# Patient Record
Sex: Male | Born: 1980 | Race: Black or African American | Hispanic: No | Marital: Single | Smoking: Current every day smoker
Health system: Southern US, Community
[De-identification: ages and names within clinical notes are randomized; demographics above are authoritative.]

## PROBLEM LIST (undated history)

## (undated) DIAGNOSIS — F32A Depression, unspecified: Secondary | ICD-10-CM

## (undated) DIAGNOSIS — Z8719 Personal history of other diseases of the digestive system: Secondary | ICD-10-CM

## (undated) DIAGNOSIS — F1910 Other psychoactive substance abuse, uncomplicated: Secondary | ICD-10-CM

## (undated) DIAGNOSIS — Z789 Other specified health status: Secondary | ICD-10-CM

## (undated) DIAGNOSIS — F329 Major depressive disorder, single episode, unspecified: Secondary | ICD-10-CM

## (undated) DIAGNOSIS — F209 Schizophrenia, unspecified: Secondary | ICD-10-CM

## (undated) HISTORY — PX: NO PAST SURGERIES: SHX2092

---

## 2012-07-22 ENCOUNTER — Emergency Department (HOSPITAL_COMMUNITY): Payer: 59

## 2012-07-22 ENCOUNTER — Encounter (HOSPITAL_COMMUNITY): Payer: Self-pay | Admitting: Emergency Medicine

## 2012-07-22 ENCOUNTER — Emergency Department (HOSPITAL_COMMUNITY)
Admission: EM | Admit: 2012-07-22 | Discharge: 2012-07-22 | Disposition: A | Discharge status: Home / Self Care | Payer: 59 | Attending: Emergency Medicine | Admitting: Emergency Medicine

## 2012-07-22 DIAGNOSIS — Y9389 Activity, other specified: Secondary | ICD-10-CM | POA: Insufficient documentation

## 2012-07-22 DIAGNOSIS — S0591XA Unspecified injury of right eye and orbit, initial encounter: Secondary | ICD-10-CM

## 2012-07-22 DIAGNOSIS — Y33XXXA Other specified events, undetermined intent, initial encounter: Secondary | ICD-10-CM | POA: Insufficient documentation

## 2012-07-22 DIAGNOSIS — Y929 Unspecified place or not applicable: Secondary | ICD-10-CM | POA: Insufficient documentation

## 2012-07-22 DIAGNOSIS — S058X9A Other injuries of unspecified eye and orbit, initial encounter: Secondary | ICD-10-CM | POA: Insufficient documentation

## 2012-07-22 DIAGNOSIS — H539 Unspecified visual disturbance: Secondary | ICD-10-CM | POA: Insufficient documentation

## 2012-07-22 DIAGNOSIS — F17200 Nicotine dependence, unspecified, uncomplicated: Secondary | ICD-10-CM | POA: Insufficient documentation

## 2012-07-22 DIAGNOSIS — S0500XA Injury of conjunctiva and corneal abrasion without foreign body, unspecified eye, initial encounter: Secondary | ICD-10-CM

## 2012-07-22 MED ORDER — TETRACAINE HCL 0.5 % OP SOLN
2.0000 [drp] | Freq: Once | OPHTHALMIC | Status: AC
  Administered 2012-07-22: 2 [drp] via OPHTHALMIC
  Filled 2012-07-22: qty 2

## 2012-07-22 MED ORDER — FLUORESCEIN-BENOXINATE 0.25-0.4 % OP SOLN
1.0000 [drp] | Freq: Once | OPHTHALMIC | Status: DC
  Filled 2012-07-22: qty 5

## 2012-07-22 MED ORDER — OXYCODONE-ACETAMINOPHEN 5-325 MG PO TABS
2.0000 | ORAL_TABLET | Freq: Once | ORAL | Status: AC
  Administered 2012-07-22: 2 via ORAL
  Filled 2012-07-22: qty 2

## 2012-07-22 MED ORDER — SULFACETAMIDE SODIUM 10 % OP SOLN: 2.0000 [drp] | OPHTHALMIC | Status: AC

## 2012-07-22 MED ORDER — FLUORESCEIN SODIUM 1 MG OP STRP
1.0000 | ORAL_STRIP | Freq: Once | OPHTHALMIC | Status: AC
  Administered 2012-07-22: 1 via OPHTHALMIC
  Filled 2012-07-22: qty 1

## 2012-07-22 NOTE — ED Notes (Signed)
NAD noted at time of d/c home 
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
## 2012-07-22 NOTE — ED Notes (Signed)
20/20 Left Eye 20/30 Right Eye

## 2012-07-22 NOTE — ED Notes (Signed)
Pt in fast track room 4, for vision acuity and exam

## 2012-07-23 NOTE — ED Provider Notes (Signed)
Medical screening examination/treatment/procedure(s) were performed by non-physician practitioner and as supervising physician I was immediately available for consultation/collaboration.  Marwan T Powers, MD 07/23/12 1647 

## 2013-03-19 ENCOUNTER — Emergency Department (HOSPITAL_COMMUNITY): Payer: 59

## 2013-03-19 ENCOUNTER — Emergency Department (HOSPITAL_COMMUNITY)
Admission: EM | Admit: 2013-03-19 | Discharge: 2013-03-19 | Disposition: A | Discharge status: Home / Self Care | Payer: 59 | Attending: Emergency Medicine | Admitting: Emergency Medicine

## 2013-03-19 ENCOUNTER — Encounter (HOSPITAL_COMMUNITY): Payer: Self-pay | Admitting: Emergency Medicine

## 2013-03-19 DIAGNOSIS — R079 Chest pain, unspecified: Secondary | ICD-10-CM

## 2013-03-19 DIAGNOSIS — F17200 Nicotine dependence, unspecified, uncomplicated: Secondary | ICD-10-CM | POA: Insufficient documentation

## 2013-03-19 DIAGNOSIS — R0789 Other chest pain: Secondary | ICD-10-CM | POA: Insufficient documentation

## 2013-03-19 LAB — CBC WITH DIFFERENTIAL/PLATELET
Basophils Absolute: 0 10*3/uL (ref 0.0–0.1)
Eosinophils Relative: 1 % (ref 0–5)
HCT: 45 % (ref 39.0–52.0)
Hemoglobin: 15.9 g/dL (ref 13.0–17.0)
Lymphocytes Relative: 27 % (ref 12–46)
Lymphs Abs: 2.5 10*3/uL (ref 0.7–4.0)
MCV: 86.9 fL (ref 78.0–100.0)
Monocytes Absolute: 0.9 10*3/uL (ref 0.1–1.0)
Neutro Abs: 5.8 10*3/uL (ref 1.7–7.7)
RBC: 5.18 MIL/uL (ref 4.22–5.81)
RDW: 13.6 % (ref 11.5–15.5)
WBC: 9.3 10*3/uL (ref 4.0–10.5)

## 2013-03-19 LAB — BASIC METABOLIC PANEL
CO2: 26 mEq/L (ref 19–32)
Chloride: 99 mEq/L (ref 96–112)
Creatinine, Ser: 0.98 mg/dL (ref 0.50–1.35)
GFR calc Af Amer: >90 mL/min (ref >90)
Potassium: 4.1 mEq/L (ref 3.5–5.1)
Sodium: 137 mEq/L (ref 135–145)

## 2013-03-19 LAB — POCT I-STAT TROPONIN I

## 2013-03-19 MED ORDER — GI COCKTAIL ~~LOC~~
30.0000 mL | Freq: Once | ORAL | Status: AC
  Administered 2013-03-19: 30 mL via ORAL
  Filled 2013-03-19: qty 30

## 2013-03-19 MED ORDER — KETOROLAC TROMETHAMINE 30 MG/ML IJ SOLN
30.0000 mg | Freq: Once | INTRAMUSCULAR | Status: AC
  Administered 2013-03-19: 30 mg via INTRAVENOUS
  Filled 2013-03-19: qty 1

## 2013-03-19 NOTE — ED Provider Notes (Signed)
History    CSN: [id number] Arrival date & time 03/19/13  1008  First MD Initiated Contact with Patient 03/19/13 1025     Chief Complaint  Patient presents with  . Chest Pain   (Consider location/radiation/quality/duration/timing/severity/associated sxs/prior Treatment) HPI Comments: 32 yo male with smoking hx, no FH cardiac, no other cardiac risk factors presents with left chest pain since Thursday, worse with movement.  Clarified with patient after reading nursing notes, worse with all movement not breathing.  Patient denies blood clot history, active cancer, recent major trauma or surgery, unilateral leg swelling/ pain, recent long travel, hemoptysis or oral contraceptives.  Pt has drank heavy etoh the past two days.  He bing drinks on weekends.  Pain constant since last night.  Non radiating, no diaphoresis or exertional component.  No injury.   Patient is a 32 y.o. male presenting with chest pain. The history is provided by the patient.  Chest Pain Pain location:  L chest Associated symptoms: no abdominal pain, no back pain, no fever, no headache, no shortness of breath and not vomiting    History reviewed. No pertinent past medical history. History reviewed. No pertinent past surgical history. History reviewed. No pertinent family history. History  Substance Use Topics  . Smoking status: Current Every Day Smoker  . Smokeless tobacco: Not on file  . Alcohol Use: Yes    Review of Systems  Constitutional: Negative for fever and chills.  HENT: Negative for neck pain and neck stiffness.   Eyes: Negative for visual disturbance.  Respiratory: Negative for shortness of breath.   Cardiovascular: Positive for chest pain.  Gastrointestinal: Negative for vomiting and abdominal pain.  Genitourinary: Negative for dysuria and flank pain.  Musculoskeletal: Negative for back pain.  Skin: Negative for rash.  Neurological: Negative for light-headedness and headaches.    Allergies   Review of patient's allergies indicates no known allergies.  Home Medications  No current outpatient prescriptions on file. BP 143/90  Pulse 101  Temp(Src) 98 F (36.7 C) (Oral)  Resp 18  SpO2 95% Physical Exam  Nursing note and vitals reviewed. Constitutional: He is oriented to person, place, and time. He appears well-developed and well-nourished.  HENT:  Head: Normocephalic and atraumatic.  Eyes: Conjunctivae are normal. Right eye exhibits no discharge. Left eye exhibits no discharge.  Neck: Normal range of motion. Neck supple. No tracheal deviation present.  Cardiovascular: Normal rate and regular rhythm.   Pulmonary/Chest: Effort normal and breath sounds normal.  Abdominal: Soft. He exhibits no distension. There is no tenderness. There is no guarding.  Musculoskeletal: He exhibits tenderness (tender parasternal to palpation bilateral). He exhibits no edema.  Neurological: He is alert and oriented to person, place, and time.  Skin: Skin is warm. No rash noted.  Psychiatric: He has a normal mood and affect.    ED Course  Procedures (including critical care time) Labs Reviewed  CBC WITH DIFFERENTIAL  BASIC METABOLIC PANEL  TROPONIN I   Dg Chest 2 View  03/19/2013   *RADIOLOGY REPORT*  Clinical Data: Chest pressure  CHEST - 2 VIEW  Comparison: None.  Findings: Normal cardiac silhouette.  No effusion, infiltrate, or pneumothorax.No acute osseous abnormality.  IMPRESSION: Normal chest radiograph.   Original Report Authenticated By: Genevive Bi, M.D.   No diagnosis found.  MDM   Date: 03/19/2013  Rate: 106  Rhythm: sinus tachycardia  QRS Axis: normal  Intervals: normal  ST/T Wave abnormalities: normal  Conduction Disutrbances:none  Narrative Interpretation:   Old EKG  Reviewed: none available  CP resolved in ED.  Atypical and pt low risk.  No PE risk factors.   Discussed smoking cessation for 5 min with pt and outpt fup. Pt will fup with primary doctor to discuss  outpt stress test. Reasons to return stressed. Well appearing at dc.    Enid Skeens, MD 03/19/13 1340

## 2013-03-19 NOTE — ED Notes (Signed)
Pt c/o left sided CP x 3 days worse with movement and inspiration

## 2014-07-30 ENCOUNTER — Encounter (HOSPITAL_COMMUNITY): Payer: Self-pay | Admitting: *Deleted

## 2014-07-30 ENCOUNTER — Encounter (HOSPITAL_COMMUNITY): Payer: Self-pay | Admitting: Emergency Medicine

## 2014-07-30 ENCOUNTER — Ambulatory Visit (HOSPITAL_COMMUNITY)
Admission: RE | Admit: 2014-07-30 | Discharge: 2014-07-30 | Disposition: A | Discharge status: Home / Self Care | Payer: 59 | Attending: Psychiatry | Admitting: Psychiatry

## 2014-07-30 ENCOUNTER — Emergency Department (HOSPITAL_COMMUNITY)
Admission: EM | Admit: 2014-07-30 | Discharge: 2014-08-01 | Disposition: A | Discharge status: Home / Self Care | Payer: 59 | Attending: Emergency Medicine | Admitting: Emergency Medicine

## 2014-07-30 DIAGNOSIS — R45851 Suicidal ideations: Secondary | ICD-10-CM

## 2014-07-30 DIAGNOSIS — Z720 Tobacco use: Secondary | ICD-10-CM | POA: Insufficient documentation

## 2014-07-30 DIAGNOSIS — F1420 Cocaine dependence, uncomplicated: Secondary | ICD-10-CM | POA: Diagnosis present

## 2014-07-30 DIAGNOSIS — F1099 Alcohol use, unspecified with unspecified alcohol-induced disorder: Secondary | ICD-10-CM | POA: Diagnosis present

## 2014-07-30 DIAGNOSIS — F1910 Other psychoactive substance abuse, uncomplicated: Secondary | ICD-10-CM

## 2014-07-30 DIAGNOSIS — F32A Depression, unspecified: Secondary | ICD-10-CM

## 2014-07-30 DIAGNOSIS — F329 Major depressive disorder, single episode, unspecified: Secondary | ICD-10-CM | POA: Insufficient documentation

## 2014-07-30 DIAGNOSIS — F1410 Cocaine abuse, uncomplicated: Secondary | ICD-10-CM | POA: Insufficient documentation

## 2014-07-30 DIAGNOSIS — F333 Major depressive disorder, recurrent, severe with psychotic symptoms: Secondary | ICD-10-CM | POA: Insufficient documentation

## 2014-07-30 HISTORY — DX: Major depressive disorder, single episode, unspecified: F32.9

## 2014-07-30 HISTORY — DX: Depression, unspecified: F32.A

## 2014-07-30 HISTORY — DX: Other psychoactive substance abuse, uncomplicated: F19.10

## 2014-07-30 LAB — SALICYLATE LEVEL

## 2014-07-30 LAB — COMPREHENSIVE METABOLIC PANEL
ALK PHOS: 70 U/L (ref 39–117)
ALT: 53 U/L (ref 0–53)
ANION GAP: 15 (ref 5–15)
AST: 62 U/L — AB (ref 0–37)
Albumin: 3.2 g/dL — ABNORMAL LOW (ref 3.5–5.2)
BUN: 12 mg/dL (ref 6–23)
CHLORIDE: 100 meq/L (ref 96–112)
CO2: 21 mEq/L (ref 19–32)
Calcium: 9.3 mg/dL (ref 8.4–10.5)
Creatinine, Ser: 0.98 mg/dL (ref 0.50–1.35)
GFR calc Af Amer: >90 mL/min (ref >90)
GFR calc non Af Amer: >90 mL/min (ref >90)
Glucose, Bld: 117 mg/dL — ABNORMAL HIGH (ref 70–99)
Potassium: 4.7 mEq/L (ref 3.7–5.3)
SODIUM: 136 meq/L — AB (ref 137–147)
Total Bilirubin: 0.6 mg/dL (ref 0.3–1.2)
Total Protein: 6.7 g/dL (ref 6.0–8.3)

## 2014-07-30 LAB — ETHANOL

## 2014-07-30 LAB — RAPID URINE DRUG SCREEN, HOSP PERFORMED
Amphetamines: NOT DETECTED
Barbiturates: NOT DETECTED
Benzodiazepines: NOT DETECTED
Cocaine: POSITIVE — AB
Opiates: NOT DETECTED
Tetrahydrocannabinol: NOT DETECTED

## 2014-07-30 LAB — CBC
HCT: 49.8 % (ref 39.0–52.0)
HEMOGLOBIN: 17.4 g/dL — AB (ref 13.0–17.0)
MCH: 31.5 pg (ref 26.0–34.0)
MCHC: 34.9 g/dL (ref 30.0–36.0)
MCV: 90.2 fL (ref 78.0–100.0)
PLATELETS: 214 10*3/uL (ref 150–400)
RBC: 5.52 MIL/uL (ref 4.22–5.81)
RDW: 12.9 % (ref 11.5–15.5)
WBC: 10.8 10*3/uL — AB (ref 4.0–10.5)

## 2014-07-30 LAB — ACETAMINOPHEN LEVEL: Acetaminophen (Tylenol), Serum: <15 ug/mL (ref 10–30)

## 2014-07-30 MED ORDER — LORAZEPAM 1 MG PO TABS
1.0000 mg | ORAL_TABLET | Freq: Three times a day (TID) | ORAL | Status: DC | PRN
  Administered 2014-07-30 – 2014-07-31 (×2): 1 mg via ORAL
  Filled 2014-07-30 (×2): qty 1

## 2014-07-30 MED ORDER — ACETAMINOPHEN 325 MG PO TABS: 650.0000 mg | ORAL_TABLET | ORAL | Status: DC | PRN

## 2014-07-30 MED ORDER — NICOTINE 21 MG/24HR TD PT24
21.0000 mg | MEDICATED_PATCH | Freq: Every day | TRANSDERMAL | Status: DC
  Administered 2014-07-30 – 2014-08-01 (×3): 21 mg via TRANSDERMAL
  Filled 2014-07-30 (×3): qty 1

## 2014-07-30 MED ORDER — ONDANSETRON HCL 4 MG PO TABS: 4.0000 mg | ORAL_TABLET | Freq: Three times a day (TID) | ORAL | Status: DC | PRN

## 2014-07-30 NOTE — ED Notes (Addendum)
[facility]. Pt reports that he is suicidal. Yesterday he played russian roulette and sometimes he cuts his "veins". Pt feels as if he shouldn't have to live with depression and addiction (cocaine, alcohol) that he should just end his life and "be done with it". Pt is calm and cooperative upon assessment and very open about why he is here.
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
## 2014-07-30 NOTE — ED Notes (Signed)
Report received from Brooks RN. Pt. Sleeping, respirations regular and unlabored. Will continue to monitor for safety. Q 15 minute checks continue. 

## 2014-07-30 NOTE — ED Notes (Signed)
[facility]

## 2014-07-30 NOTE — ED Notes (Signed)
Nursing admission note: [facility] from [facility] and crack cocaine. He stated his last use of both was on 07-29-14 when he used x6 40 oz beers and 2-3 grams of cocaine. He stated that he needs to get off the substances because it is depressing him and he stated is addiction issues are causing him to be suicidal. On admission he was able to contract for the SI , verbally. He also stated he has had auditory hallucinations in the past, but is not having them now.  He denies any hi/vh. He denied any home med's, allergies and pain. He was polite, cooperative and seems motivated for treatment. He was given a ativan for anxiety at 1614 and he later went to sleep. RN will monitor and Q 15 min ck's continue.

## 2014-07-31 MED ORDER — FLUOXETINE HCL 20 MG PO CAPS
20.0000 mg | ORAL_CAPSULE | Freq: Every day | ORAL | Status: DC
  Administered 2014-07-31 – 2014-08-01 (×2): 20 mg via ORAL
  Filled 2014-07-31 (×2): qty 1

## 2014-07-31 MED ORDER — ZOLPIDEM TARTRATE 5 MG PO TABS
5.0000 mg | ORAL_TABLET | Freq: Every day | ORAL | Status: DC
  Administered 2014-07-31: 5 mg via ORAL
  Filled 2014-07-31: qty 1

## 2014-07-31 NOTE — ED Notes (Signed)
Patient has been in his room most of the day.  Up to bathroom and to shower.  Has been pleasant and cooperative.  States he hears voices at times, but not right now.  Requested medication for anxiety which was given and reported as effective.  Also asked if there was any detox for cocaine.  Advised there is not, but he could pursue rehab centers.

## 2014-07-31 NOTE — BH Assessment (Signed)
[facility] Assessment Progress Note    The following Adult psych facilities were contacted in an attempt to place the pt:  Referral faxed for review: [facility] (beds available per Margaret) [facility] (beds available per Jennifer)  At capacity:  [facility] (at capacity per Brent) [facility] (at capacity per Erica) [facility] (at capacity per Jonathan) [facility] (at capacity per Sissy) [facility] (at capacity per Pat) [facility] (at capacity per Paula) [facility] (at capacity per Kimberly)  Not taking SA patients: [facility]  [facility]  No answer: [facility]    TTS will continue to seek placement for the pt.  Damiana Berrian, MS, CRC, LPC Licensed Professional Counselor Therapeutic Triage Specialist [facility] Phone: [phone] Fax: [phone] 

## 2014-08-01 DIAGNOSIS — F1994 Other psychoactive substance use, unspecified with psychoactive substance-induced mood disorder: Secondary | ICD-10-CM

## 2014-08-01 DIAGNOSIS — F332 Major depressive disorder, recurrent severe without psychotic features: Secondary | ICD-10-CM

## 2014-08-01 MED ORDER — TRAZODONE HCL 100 MG PO TABS: 100.0000 mg | ORAL_TABLET | Freq: Every day | ORAL | Status: DC

## 2014-08-01 MED ORDER — FLUOXETINE HCL 20 MG PO CAPS: 20.0000 mg | ORAL_CAPSULE | Freq: Every day | ORAL | Status: DC
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
## 2014-08-01 NOTE — ED Notes (Signed)
Patient reports feeling much after taking prozac.

## 2014-08-01 NOTE — BHH Suicide Risk Assessment (Signed)
Suicide Risk Assessment  Discharge Assessment     Demographic Factors:  Male  Total Time spent with patient: 45 minutes  Psychiatric Specialty Exam:     Blood pressure 119/80, pulse 84, temperature 98 F (36.7 C), temperature source Oral, resp. rate 16, SpO2 100 %.There is no height or weight on file to calculate BMI.   General Appearance: Casual and Fairly Groomed  Patent attorneyye Contact:: Good  Speech: Clear and Coherent and Normal Rate  Volume: Normal  Mood: Anxious  Affect: Appropriate and Congruent  Thought Process: Coherent and Goal Directed  Orientation: Full (Time, Place, and Person)  Thought Content: WDL  Suicidal Thoughts: No  Homicidal Thoughts: No  Memory: Immediate; Fair Recent; Fair Remote; Fair  Judgement: Fair  Insight: Good  Psychomotor Activity: Normal  Concentration: Good  Recall: Good  Fund of Knowledge:Good  Language: Good  Akathisia: No  Handed:   AIMS (if indicated):    Assets: Communication Skills Leisure Time Physical Health Resilience Social Support  Sleep:     Musculoskeletal: Strength & Muscle Tone: within normal limits Gait & Station: normal Patient leans: N/A    Mental Status Per Nursing Assessment::   On Admission:     Current Mental Status by Physician: Denies SI at this time  Loss Factors: Financial problems/change in socioeconomic status  Historical Factors: Impulsivity  Risk Reduction Factors:   Positive social support and Positive coping skills or problem solving skills  Continued Clinical Symptoms:  Depression:   Anhedonia  Cognitive Features That Contribute To Risk:  Closed-mindedness    Suicide Risk:  Mild:  Suicidal ideation of limited frequency, intensity, duration, and specificity.  There are no identifiable plans, no associated intent, mild dysphoria and related symptoms, good self-control (both objective and subjective assessment), few other risk factors, and  identifiable protective factors, including available and accessible social support.  Discharge Diagnoses:   AXIS I: Major Depression, Recurrent severe, Substance Abuse and Substance Induced Mood Disorder AXIS II: Deferred AXIS III:  Past Medical History  Diagnosis Date  . Substance abuse   . Depression    AXIS IV: other psychosocial or environmental problems, problems related to social environment and problems with primary support group AXIS V: 51-60 moderate symptoms   Plan Of Care/Follow-up recommendations:  Activity:  As tolerated Diet:  Heart healthy with low sodium.  Is patient on multiple antipsychotic therapies at discharge:  No   Has Patient had three or more failed trials of antipsychotic monotherapy by history:  No  Recommended Plan for Multiple Antipsychotic Therapies: NA    Rada Zegers, Everardo AllJohn C, FNP-BC 08/01/2014, 5:50 PM

## 2014-08-01 NOTE — BH Assessment (Signed)
Discharge home with outpatient follow up per Earleen Newport, NP and Dr. Darleene Cleaver. Writer met with patient prior to discharge to discuss follow up. Patient given information to [facility] and [facility]. Patient agreeable to follow up instructions.

## 2014-08-02 ENCOUNTER — Encounter (HOSPITAL_BASED_OUTPATIENT_CLINIC_OR_DEPARTMENT_OTHER): Payer: Self-pay | Admitting: Emergency Medicine

## 2015-08-04 ENCOUNTER — Inpatient Hospital Stay (HOSPITAL_COMMUNITY)
Admission: AD | Admit: 2015-08-04 | Discharge: 2015-08-08 | DRG: 885 | Disposition: A | Discharge status: Home / Self Care | Payer: Federal, State, Local not specified - Other | Source: Intra-hospital | Attending: Psychiatry | Admitting: Psychiatry

## 2015-08-04 ENCOUNTER — Emergency Department (HOSPITAL_COMMUNITY)
Admission: EM | Admit: 2015-08-04 | Discharge: 2015-08-04 | Disposition: A | Discharge status: Other Acute Inpatient Hospital | Payer: Self-pay | Attending: Emergency Medicine | Admitting: Emergency Medicine

## 2015-08-04 ENCOUNTER — Encounter (HOSPITAL_COMMUNITY): Payer: Self-pay | Admitting: Emergency Medicine

## 2015-08-04 ENCOUNTER — Encounter (HOSPITAL_COMMUNITY): Payer: Self-pay | Admitting: *Deleted

## 2015-08-04 DIAGNOSIS — F323 Major depressive disorder, single episode, severe with psychotic features: Secondary | ICD-10-CM | POA: Diagnosis present

## 2015-08-04 DIAGNOSIS — F1410 Cocaine abuse, uncomplicated: Secondary | ICD-10-CM | POA: Insufficient documentation

## 2015-08-04 DIAGNOSIS — F17200 Nicotine dependence, unspecified, uncomplicated: Secondary | ICD-10-CM | POA: Diagnosis present

## 2015-08-04 DIAGNOSIS — F329 Major depressive disorder, single episode, unspecified: Secondary | ICD-10-CM | POA: Insufficient documentation

## 2015-08-04 DIAGNOSIS — Z79899 Other long term (current) drug therapy: Secondary | ICD-10-CM | POA: Insufficient documentation

## 2015-08-04 DIAGNOSIS — R45851 Suicidal ideations: Secondary | ICD-10-CM | POA: Diagnosis not present

## 2015-08-04 DIAGNOSIS — R443 Hallucinations, unspecified: Secondary | ICD-10-CM | POA: Insufficient documentation

## 2015-08-04 LAB — RAPID URINE DRUG SCREEN, HOSP PERFORMED
AMPHETAMINES: NOT DETECTED
BARBITURATES: NOT DETECTED
Benzodiazepines: NOT DETECTED
Cocaine: POSITIVE — AB
Opiates: NOT DETECTED
Tetrahydrocannabinol: NOT DETECTED

## 2015-08-04 LAB — COMPREHENSIVE METABOLIC PANEL
ALT: 43 U/L (ref 17–63)
AST: 32 U/L (ref 15–41)
Albumin: 4.1 g/dL (ref 3.5–5.0)
Alkaline Phosphatase: 64 U/L (ref 38–126)
Anion gap: 10 (ref 5–15)
BILIRUBIN TOTAL: 1 mg/dL (ref 0.3–1.2)
BUN: 10 mg/dL (ref 6–20)
CO2: 26 mmol/L (ref 22–32)
CREATININE: 1.1 mg/dL (ref 0.61–1.24)
Calcium: 9.1 mg/dL (ref 8.9–10.3)
Chloride: 102 mmol/L (ref 101–111)
GFR calc Af Amer: >60 mL/min (ref >60)
Glucose, Bld: 88 mg/dL (ref 65–99)
Potassium: 3.3 mmol/L — ABNORMAL LOW (ref 3.5–5.1)
Sodium: 138 mmol/L (ref 135–145)
TOTAL PROTEIN: 7.1 g/dL (ref 6.5–8.1)

## 2015-08-04 LAB — CBC
HCT: 49.1 % (ref 39.0–52.0)
Hemoglobin: 17 g/dL (ref 13.0–17.0)
MCH: 30.6 pg (ref 26.0–34.0)
MCHC: 34.6 g/dL (ref 30.0–36.0)
MCV: 88.3 fL (ref 78.0–100.0)
PLATELETS: 208 10*3/uL (ref 150–400)
RBC: 5.56 MIL/uL (ref 4.22–5.81)
RDW: 13.2 % (ref 11.5–15.5)
WBC: 11.6 10*3/uL — ABNORMAL HIGH (ref 4.0–10.5)

## 2015-08-04 LAB — SALICYLATE LEVEL: Salicylate Lvl: <4 mg/dL (ref 2.8–30.0)

## 2015-08-04 LAB — ETHANOL: ALCOHOL ETHYL (B): 22 mg/dL — AB (ref <5)

## 2015-08-04 LAB — ACETAMINOPHEN LEVEL: Acetaminophen (Tylenol), Serum: <10 ug/mL — ABNORMAL LOW (ref 10–30)

## 2015-08-04 MED ORDER — THIAMINE HCL 100 MG/ML IJ SOLN: 100.0000 mg | Freq: Once | INTRAMUSCULAR | Status: DC

## 2015-08-04 MED ORDER — CHLORDIAZEPOXIDE HCL 25 MG PO CAPS
25.0000 mg | ORAL_CAPSULE | Freq: Every day | ORAL | Status: AC
  Administered 2015-08-07: 25 mg via ORAL
  Filled 2015-08-04: qty 1

## 2015-08-04 MED ORDER — ONDANSETRON 4 MG PO TBDP: 4.0000 mg | ORAL_TABLET | Freq: Four times a day (QID) | ORAL | Status: AC | PRN

## 2015-08-04 MED ORDER — ACETAMINOPHEN 325 MG PO TABS: 650.0000 mg | ORAL_TABLET | Freq: Four times a day (QID) | ORAL | Status: DC | PRN

## 2015-08-04 MED ORDER — HYDROXYZINE HCL 25 MG PO TABS
25.0000 mg | ORAL_TABLET | Freq: Four times a day (QID) | ORAL | Status: AC | PRN
  Filled 2015-08-04: qty 1

## 2015-08-04 MED ORDER — POTASSIUM CHLORIDE CRYS ER 20 MEQ PO TBCR
20.0000 meq | EXTENDED_RELEASE_TABLET | Freq: Two times a day (BID) | ORAL | Status: DC
  Administered 2015-08-04: 20 meq via ORAL
  Filled 2015-08-04 (×3): qty 1

## 2015-08-04 MED ORDER — ALUM & MAG HYDROXIDE-SIMETH 200-200-20 MG/5ML PO SUSP: 30.0000 mL | ORAL | Status: DC | PRN

## 2015-08-04 MED ORDER — CHLORDIAZEPOXIDE HCL 25 MG PO CAPS
25.0000 mg | ORAL_CAPSULE | Freq: Three times a day (TID) | ORAL | Status: AC
  Administered 2015-08-05 (×3): 25 mg via ORAL
  Filled 2015-08-04 (×3): qty 1

## 2015-08-04 MED ORDER — ADULT MULTIVITAMIN W/MINERALS CH
1.0000 | ORAL_TABLET | Freq: Every day | ORAL | Status: DC
  Administered 2015-08-04 – 2015-08-08 (×5): 1 via ORAL
  Filled 2015-08-04 (×7): qty 1

## 2015-08-04 MED ORDER — MAGNESIUM HYDROXIDE 400 MG/5ML PO SUSP: 30.0000 mL | Freq: Every day | ORAL | Status: DC | PRN

## 2015-08-04 MED ORDER — VITAMIN B-1 100 MG PO TABS
100.0000 mg | ORAL_TABLET | Freq: Every day | ORAL | Status: DC
  Administered 2015-08-05 – 2015-08-08 (×4): 100 mg via ORAL
  Filled 2015-08-04 (×6): qty 1

## 2015-08-04 MED ORDER — CHLORDIAZEPOXIDE HCL 25 MG PO CAPS
25.0000 mg | ORAL_CAPSULE | Freq: Four times a day (QID) | ORAL | Status: AC | PRN
  Administered 2015-08-04: 25 mg via ORAL
  Filled 2015-08-04: qty 1

## 2015-08-04 MED ORDER — CHLORDIAZEPOXIDE HCL 25 MG PO CAPS
25.0000 mg | ORAL_CAPSULE | Freq: Four times a day (QID) | ORAL | Status: AC
  Administered 2015-08-04 (×4): 25 mg via ORAL
  Filled 2015-08-04 (×4): qty 1

## 2015-08-04 MED ORDER — CHLORDIAZEPOXIDE HCL 25 MG PO CAPS
25.0000 mg | ORAL_CAPSULE | ORAL | Status: AC
  Administered 2015-08-06 (×2): 25 mg via ORAL
  Filled 2015-08-04 (×2): qty 1

## 2015-08-04 MED ORDER — FLUOXETINE HCL 20 MG PO CAPS
20.0000 mg | ORAL_CAPSULE | Freq: Every day | ORAL | Status: DC
  Administered 2015-08-04 – 2015-08-08 (×5): 20 mg via ORAL
  Filled 2015-08-04 (×3): qty 1
  Filled 2015-08-04: qty 7
  Filled 2015-08-04 (×3): qty 1

## 2015-08-04 MED ORDER — NICOTINE 21 MG/24HR TD PT24
21.0000 mg | MEDICATED_PATCH | Freq: Every day | TRANSDERMAL | Status: DC
  Administered 2015-08-04 – 2015-08-08 (×5): 21 mg via TRANSDERMAL
  Filled 2015-08-04 (×7): qty 1

## 2015-08-04 MED ORDER — NICOTINE 21 MG/24HR TD PT24: 21.0000 mg | MEDICATED_PATCH | Freq: Every day | TRANSDERMAL | Status: DC

## 2015-08-04 MED ORDER — ACETAMINOPHEN 325 MG PO TABS: 650.0000 mg | ORAL_TABLET | ORAL | Status: DC | PRN

## 2015-08-04 MED ORDER — AMANTADINE HCL 100 MG PO CAPS
100.0000 mg | ORAL_CAPSULE | Freq: Two times a day (BID) | ORAL | Status: DC
  Filled 2015-08-04 (×2): qty 1

## 2015-08-04 MED ORDER — LOPERAMIDE HCL 2 MG PO CAPS: 2.0000 mg | ORAL_CAPSULE | ORAL | Status: AC | PRN

## 2015-08-04 MED ORDER — ENSURE ENLIVE PO LIQD
237.0000 mL | Freq: Two times a day (BID) | ORAL | Status: DC
  Administered 2015-08-04 – 2015-08-08 (×8): 237 mL via ORAL

## 2015-08-04 MED ORDER — TRAZODONE HCL 100 MG PO TABS
100.0000 mg | ORAL_TABLET | Freq: Every day | ORAL | Status: DC
  Administered 2015-08-04 – 2015-08-05 (×2): 100 mg via ORAL
  Filled 2015-08-04 (×4): qty 1

## 2015-08-04 MED ORDER — POTASSIUM CHLORIDE CRYS ER 20 MEQ PO TBCR
20.0000 meq | EXTENDED_RELEASE_TABLET | Freq: Two times a day (BID) | ORAL | Status: AC
  Administered 2015-08-05: 20 meq via ORAL
  Filled 2015-08-04 (×2): qty 1

## 2015-08-04 MED ORDER — ONDANSETRON HCL 4 MG PO TABS: 4.0000 mg | ORAL_TABLET | Freq: Three times a day (TID) | ORAL | Status: DC | PRN

## 2015-08-04 NOTE — Progress Notes (Signed)
Adult Psychoeducational Group Note  Date:  08/04/2015 Time:  9:52 PM  Group Topic/Focus:  Wrap-Up Group:   The focus of this group is to help patients review their daily goal of treatment and discuss progress on daily workbooks.  Participation Level:  Active  Participation Quality:  Appropriate and Attentive  Affect:  Appropriate  Cognitive:  Alert and Appropriate  Insight: Appropriate and Good  Engagement in Group:  Engaged and Supportive  Modes of Intervention:  Discussion  Additional Comments:  Patient confirmed attending all groups during the day.  While in group, Patient reported beginning to feel comfortable with discussing his feelings in group and eventually crying.  Patient confirmed looking toward to attending group during the upcoming days. Patient reported working to utilize Optician, dispensingstress management.  Patient identified a future goal of working on his discharge plan.    Elmore GuiseSLOAN, Nyx Keady N 08/04/2015, 9:52 PM
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
## 2015-08-04 NOTE — Progress Notes (Signed)
[facility] and  Depressed reporting passive SI, no current plan.  Pt. Shows writer the scars on his left forearm stating he has tried to commit suicide by cutting in the past.  Pt. Reports his Girlfriend called 911 because he was threatening to do it again.  Pt. Is tearful stating "I need to go to a longterm treatment to get off the cocaine".  Pt. States,"I can't quit thinking about it, I dream about it". Pt. Was  Positive for  cocaine , ETOH level was 22, K+ was 3.3, and WBC 11.6.  Pt. Is offered food and fluids.  Pt. Denied any pain.

## 2015-08-04 NOTE — Progress Notes (Signed)
DAR NOTE: Patient presents with anxious affect and depressed mood.  Denies pain, auditory and visual hallucinations.  Reports withdrawal symptoms of diarrhea, cravings, agitation on self inventory form.  Reports suicidal thoughts but contracts for safety.  Rates depression at 5, hopelessness at 7, and anxiety at 3.  Maintained on routine safety checks.  Medications given as prescribed.  Support and encouragement offered as needed.  Attended group and participated.  States goal for today is "getting clean."  Patient in the dayroom watching TV.

## 2015-08-04 NOTE — H&P (Signed)
Psychiatric Admission Assessment Adult  Patient Identification: Brian Nolan MRN:  [id number] Date of Evaluation:  08/04/2015 Chief Complaint:  MDD,REC,SEV WITH PSYCH FEATURES Principal Diagnosis: Major depressive disorder, single episode, severe with psychotic features ([place]) Diagnosis:   Patient Active Problem List   Diagnosis Date Noted  . Major depressive disorder, single episode, severe with psychotic features ([place]) [F32.3] 08/04/2015   History of Present Illness:Per H&PPatient with a history of polysubstance abuse, previous suicidal ideation and previous self-mutilation (cutting) presents with complaint of recurrent suicidal ideations. He states he feels he has worsened to the point where he is hearing a voice telling him "to go ahead and do it" with regard to killing himself. He feels he is overwhelmed by life circumstances and cannot cope. He states he used cocaine and drank alcohol today to kill the pain.  On Evaluation:Brian Nolan is awake, alert and oriented X4 , found interacting with peers.  Denies suicidal or homicidal ideation at this time. Reports a recent event to of cutting to left arm/ wrist for "attention". Denies auditory or visual hallucination and does not appear to be responding to internal stimuli. Patient interacts well with staff and others. Patient reports he is medication compliant without mediation side effects. Report learning new coping skills and is thankful to see others in similar situations.. States his depression 8/10. Report a 20 year history of cocaine addition and is requesting help to stop. Reports good appetite other wise and resting well. Patient report she is excited regarding discharge. Support, encouragement and reassurance was provided.  Associated Signs/Symptoms: Depression Symptoms:  depressed mood, hopelessness, suicidal thoughts without plan, anxiety, (Hypo) Manic Symptoms:  Impulsivity,  Patient reports self harm 3days ago/ attempted to cut  arms/wrist with knife Anxiety Symptoms:  Excessive Worry, Social Anxiety, Psychotic Symptoms:  Hallucinations: Auditory Visual States Command Hallucination sometime PTSD Symptoms: Avoidance:  Foreshortened Future Total Time spent with patient: 45 minutes  Past Psychiatric History:MDD  Risk to Self: Is patient at risk for suicide?: Yes Risk to Others:   Prior Inpatient Therapy:   Prior Outpatient Therapy:    Alcohol Screening: 1. How often do you have a drink containing alcohol?: 4 or more times a week 2. How many drinks containing alcohol do you have on a typical day when you are drinking?: 1 or 2 3. How often do you have six or more drinks on one occasion?: Weekly Preliminary Score: 3 4. How often during the last year have you found that you were not able to stop drinking once you had started?: Weekly 5. How often during the last year have you failed to do what was normally expected from you becasue of drinking?: Weekly 6. How often during the last year have you needed a first drink in the morning to get yourself going after a heavy drinking session?: Weekly 7. How often during the last year have you had a feeling of guilt of remorse after drinking?: Daily or almost daily 8. How often during the last year have you been unable to remember what happened the night before because you had been drinking?: Weekly 9. Have you or someone else been injured as a result of your drinking?: Yes, during the last year 10. Has a relative or friend or a doctor or another health worker been concerned about your drinking or suggested you cut down?: Yes, during the last year Alcohol Use Disorder Identification Test Final Score (AUDIT): 31 Brief Intervention:  (on-call provider notified of score) Substance Abuse History in the last  12 months:  Yes.   Consequences of Substance Abuse: Family Consequences:  not supportive  Withdrawal Symptoms:   Headaches Nausea Previous Psychotropic Medications: No   Psychological Evaluations: No  Past Medical History:  Past Medical History  Diagnosis Date  . Substance abuse   . Depression    History reviewed. No pertinent past surgical history. Family History: History reviewed. No pertinent family history. Family Psychiatric  History: Mother: EtoH abuse, Uncle: Bipolar Social History:  History  Alcohol Use  . Yes     History  Drug Use  . Yes  . Special: "Crack" cocaine    Social History   Social History  . Marital Status: Single    Spouse Name: N/A  . Number of Children: N/A  . Years of Education: N/A   Social History Main Topics  . Smoking status: Current Every Day Smoker  . Smokeless tobacco: None  . Alcohol Use: Yes  . Drug Use: Yes    Special: "Crack" cocaine  . Sexual Activity: Not Asked   Other Topics Concern  . None   Social History Narrative   Additional Social History:    Pain Medications: denies Prescriptions: denies Over the Counter: denies History of alcohol / drug use?: Yes Longest period of sobriety (when/how long): unsure Negative Consequences of Use: Financial, Personal relationships, Work / Youth worker Withdrawal Symptoms: Tremors Name of Substance 1: Alcohol 1 - Age of First Use: 11 1 - Amount (size/oz): varies-has been drinking beer and wine 1 - Frequency: varies, has more than 4 drinks on one occasion weekly 1 - Duration: ongoing 1 - Last Use / Amount: yesterday "a lot"  Name of Substance 2: Crack cocaine 2 - Age of First Use: 17 2 - Amount (size/oz): varies when he can get it  2 - Frequency: varies weekly 2 - Duration: ongoing                Allergies:  No Known Allergies Lab Results:  Results for orders placed or performed during the hospital encounter of 08/04/15 (from the past 48 hour(s))  Urine rapid drug screen (hosp performed) (Not at [facility])     Status: Abnormal   Collection Time: 08/04/15 12:50 AM  Result Value Ref Range   Opiates NONE DETECTED NONE DETECTED   Cocaine POSITIVE (A)  NONE DETECTED   Benzodiazepines NONE DETECTED NONE DETECTED   Amphetamines NONE DETECTED NONE DETECTED   Tetrahydrocannabinol NONE DETECTED NONE DETECTED   Barbiturates NONE DETECTED NONE DETECTED    Comment:        DRUG SCREEN FOR MEDICAL PURPOSES ONLY.  IF CONFIRMATION IS NEEDED FOR ANY PURPOSE, NOTIFY LAB WITHIN 5 DAYS.        LOWEST DETECTABLE LIMITS FOR URINE DRUG SCREEN Drug Class       Cutoff (ng/mL) Amphetamine      1000 Barbiturate      200 Benzodiazepine   235 Tricyclics       361 Opiates          300 Cocaine          300 THC              50   Comprehensive metabolic panel     Status: Abnormal   Collection Time: 08/04/15 12:57 AM  Result Value Ref Range   Sodium 138 135 - 145 mmol/L   Potassium 3.3 (L) 3.5 - 5.1 mmol/L   Chloride 102 101 - 111 mmol/L   CO2 26 22 - 32 mmol/L  Glucose, Bld 88 65 - 99 mg/dL   BUN 10 6 - 20 mg/dL   Creatinine, Ser 1.10 0.61 - 1.24 mg/dL   Calcium 9.1 8.9 - 10.3 mg/dL   Total Protein 7.1 6.5 - 8.1 g/dL   Albumin 4.1 3.5 - 5.0 g/dL   AST 32 15 - 41 U/L   ALT 43 17 - 63 U/L   Alkaline Phosphatase 64 38 - 126 U/L   Total Bilirubin 1.0 0.3 - 1.2 mg/dL   GFR calc non Af Amer >60 >60 mL/min   GFR calc Af Amer >60 >60 mL/min    Comment: (NOTE) The eGFR has been calculated using the CKD EPI equation. This calculation has not been validated in all clinical situations. eGFR's persistently <60 mL/min signify possible Chronic Kidney Disease.    Anion gap 10 5 - 15  Ethanol (ETOH)     Status: Abnormal   Collection Time: 08/04/15 12:57 AM  Result Value Ref Range   Alcohol, Ethyl (B) 22 (H) <5 mg/dL    Comment:        LOWEST DETECTABLE LIMIT FOR SERUM ALCOHOL IS 5 mg/dL FOR MEDICAL PURPOSES ONLY   Salicylate level     Status: None   Collection Time: 08/04/15 12:57 AM  Result Value Ref Range   Salicylate Lvl <2.3 2.8 - 30.0 mg/dL  Acetaminophen level     Status: Abnormal   Collection Time: 08/04/15 12:57 AM  Result Value Ref Range    Acetaminophen (Tylenol), Serum <10 (L) 10 - 30 ug/mL    Comment:        THERAPEUTIC CONCENTRATIONS VARY SIGNIFICANTLY. A RANGE OF 10-30 ug/mL MAY BE AN EFFECTIVE CONCENTRATION FOR MANY PATIENTS. HOWEVER, SOME ARE BEST TREATED AT CONCENTRATIONS OUTSIDE THIS RANGE. ACETAMINOPHEN CONCENTRATIONS >150 ug/mL AT 4 HOURS AFTER INGESTION AND >50 ug/mL AT 12 HOURS AFTER INGESTION ARE OFTEN ASSOCIATED WITH TOXIC REACTIONS.   CBC     Status: Abnormal   Collection Time: 08/04/15 12:57 AM  Result Value Ref Range   WBC 11.6 (H) 4.0 - 10.5 K/uL   RBC 5.56 4.22 - 5.81 MIL/uL   Hemoglobin 17.0 13.0 - 17.0 g/dL   HCT 49.1 39.0 - 52.0 %   MCV 88.3 78.0 - 100.0 fL   MCH 30.6 26.0 - 34.0 pg   MCHC 34.6 30.0 - 36.0 g/dL   RDW 13.2 11.5 - 15.5 %   Platelets 208 150 - 557 K/uL    Metabolic Disorder Labs:  No results found for: HGBA1C, MPG No results found for: PROLACTIN No results found for: CHOL, TRIG, HDL, CHOLHDL, VLDL, LDLCALC  Current Medications: Current Facility-Administered Medications  Medication Dose Route Frequency Provider Last Rate Last Dose  . acetaminophen (TYLENOL) tablet 650 mg  650 mg Oral Q6H PRN Harriet Butte, NP      . alum & mag hydroxide-simeth (MAALOX/MYLANTA) 200-200-20 MG/5ML suspension 30 mL  30 mL Oral Q4H PRN Harriet Butte, NP      . chlordiazePOXIDE (LIBRIUM) capsule 25 mg  25 mg Oral Q6H PRN Harriet Butte, NP   25 mg at 08/04/15 0453  . chlordiazePOXIDE (LIBRIUM) capsule 25 mg  25 mg Oral QID Harriet Butte, NP   25 mg at 08/04/15 1204   Followed by  . [START ON 08/05/2015] chlordiazePOXIDE (LIBRIUM) capsule 25 mg  25 mg Oral TID Harriet Butte, NP       Followed by  . [START ON 08/06/2015] chlordiazePOXIDE (LIBRIUM) capsule 25 mg  25 mg  Oral BH-qamhs Harriet Butte, NP       Followed by  . [START ON 08/07/2015] chlordiazePOXIDE (LIBRIUM) capsule 25 mg  25 mg Oral Daily Harriet Butte, NP      . feeding supplement (ENSURE ENLIVE) (ENSURE ENLIVE) liquid  237 mL  237 mL Oral BID BM Myer Peer Cobos, MD   237 mL at 08/04/15 1507  . FLUoxetine (PROZAC) capsule 20 mg  20 mg Oral Daily Harriet Butte, NP   20 mg at 08/04/15 1791  . hydrOXYzine (ATARAX/VISTARIL) tablet 25 mg  25 mg Oral Q6H PRN Harriet Butte, NP      . loperamide (IMODIUM) capsule 2-4 mg  2-4 mg Oral PRN Harriet Butte, NP      . magnesium hydroxide (MILK OF MAGNESIA) suspension 30 mL  30 mL Oral Daily PRN Harriet Butte, NP      . multivitamin with minerals tablet 1 tablet  1 tablet Oral Daily Harriet Butte, NP   1 tablet at 08/04/15 [id number]  . nicotine (NICODERM CQ - dosed in mg/24 hours) patch 21 mg  21 mg Transdermal Daily Jenne Campus, MD   21 mg at 08/04/15 0810  . ondansetron (ZOFRAN-ODT) disintegrating tablet 4 mg  4 mg Oral Q6H PRN Harriet Butte, NP      . thiamine (B-1) injection 100 mg  100 mg Intramuscular Once Harriet Butte, NP   100 mg at 08/04/15 0450  . [START ON 08/05/2015] thiamine (VITAMIN B-1) tablet 100 mg  100 mg Oral Daily Harriet Butte, NP      . traZODone (DESYREL) tablet 100 mg  100 mg Oral QHS Harriet Butte, NP       PTA Medications: Prescriptions prior to admission  Medication Sig Dispense Refill Last Dose  . acetaminophen (TYLENOL) 325 MG tablet Take 650 mg by mouth every 6 (six) hours as needed for headache.     Marland Kitchen FLUoxetine (PROZAC) 20 MG capsule Take 1 capsule (20 mg total) by mouth daily. 30 capsule 0   . ibuprofen (ADVIL,MOTRIN) 200 MG tablet Take 400 mg by mouth every 6 (six) hours as needed for headache or moderate pain.   07/28/2014 at Unknown time  . traZODone (DESYREL) 100 MG tablet Take 1 tablet (100 mg total) by mouth at bedtime. 14 tablet 0     Musculoskeletal: Strength & Muscle Tone: within normal limits Gait & Station: normal Patient leans: N/A  Psychiatric Specialty Exam: Physical Exam  Nursing note and vitals reviewed. Constitutional: He is oriented to person, place, and time. He appears well-developed.  HENT:   Head: Normocephalic.  Neck: Neck supple.  Respiratory: Effort normal.  Musculoskeletal: Normal range of motion.  Neurological: He is alert and oriented to person, place, and time.  Skin: Skin is warm and dry.  Psychiatric: He has a normal mood and affect. His behavior is normal.    Review of Systems  Constitutional: Positive for malaise/fatigue.  Neurological: Positive for weakness.  Psychiatric/Behavioral: Positive for depression and substance abuse. The patient is nervous/anxious and has insomnia.   All other systems reviewed and are negative.   Blood pressure 112/66, pulse 115, temperature 98.4 F (36.9 C), temperature source Oral, resp. rate 16, height 5' 9"  (1.753 m), weight 87.544 kg (193 lb), SpO2 96 %.Body mass index is 28.49 kg/(m^2).  General Appearance: Casual and Well Groomed  Eye Contact::  Good  Speech:  Clear and Coherent and Normal Rate  Volume:  Normal  Mood:  Anxious and Depressed  Affect:  Depressed and Flat  Thought Process:  Goal Directed, Linear and Logical  Orientation:  Full (Time, Place, and Person)  Thought Content:  Hallucinations: Visual and Rumination  Suicidal Thoughts:  Yes.  without intent/plan Past attempt 3 days ago  Homicidal Thoughts:  No  Memory:  Immediate;   Fair Recent;   Fair Remote;   Fair  Judgement:  Fair  Insight:  Fair  Psychomotor Activity:  Restlessness  Concentration:  Fair  Recall:  [employer] of Knowledge:Fair  Language: Good  Akathisia:  No  Handed:  Right  AIMS (if indicated):     Assets:  Desire for Improvement Financial Resources/Insurance Transportation Vocational/Educational  ADL's:  Intact  Cognition: WNL  Sleep:  Number of Hours: 1     Treatment Plan Summary: Daily contact with patient to assess and evaluate symptoms and progress in treatment and Medication management   Continue Prozac 20 mg for mood stabilization. Continue with Trazodone 100 mg for insomnia Start Potassium Chloride SA (K-DUR, K  LOR-CON) 20 mEq  X 1 day for low potassium recheck level on 12/5//2015  Started on CWIA/ Librium Protocol Will continue to monitor vitals ,medication compliance and treatment side effects while patient is here.  Reviewed labs Glucose WNL, elevated ,BAL - 22, UDS +pos for cocaine. CSW will start working on disposition.  Patient to participate in therapeutic milieu  Observation Level/Precautions:  15 minute checks  Laboratory:  CBC Chemistry Profile UDS UA Vitamin B-12 Reviewed    Psychotherapy:  Individual and group session  Medications:  Prozac 20 mg, trazodone 100 mg  Consultations:  Psychiatry  Discharge Concerns:  Safety, stabilization, and risk of access to medication and medication stabilization   Estimated LOS: 5-7 days  Other:     I certify that inpatient services furnished can reasonably be expected to improve the patient's condition.   Derrill Center FNP-BC 12/3/20164:03 PM

## 2015-08-04 NOTE — [facility] Group Notes (Signed)
[facility] Group Notes:  (Clinical Social Work)  08/04/2015     1:15-2:15PM  Summary of Progress/Problems:   The main focus of today's process group was to discuss healthy versus unhealthy coping techniques.  Patients listed needs that they have, as well as both unhealthy and healthy ways they have tried to get their needs met.  Motivational Interviewing was utilized to help patients explore in depth the common element within the group which was suicidal ideation and/or attempts.  Various individual explained their life circumstances and feelings that led to their SI, as well as protective factors that are in their lives.  The patient expressed that he is in the hospital due to having bad thoughts last night at the [employer] downtown, after having an argument with his girlfriend.  He said repeatedly his life was doing really well in 2003 and he wants to return to that normal life.  Protective factors for him not hurting himself include his children, especially his 15yo daughter who is not very much like her mother, but more like him.  Type of Therapy:  Group Therapy - Process   Participation Level:  Active  Participation Quality:  Attentive, Sharing and Supportive  Affect:  Depressed and Tearful  Cognitive:  Appropriate  Insight:  Engaged  Engagement in Therapy:  Engaged  Modes of Intervention:  Education, Motivational Interviewing  Selmer Dominion, LCSW 08/04/2015, 3:54 PM

## 2015-08-04 NOTE — ED Provider Notes (Signed)
CSN: [id number]     Arrival date & time 08/04/15  0030 History   First MD Initiated Contact with Patient 08/04/15 [phone]     Chief Complaint  Patient presents with  . Suicidal     (Consider location/radiation/quality/duration/timing/severity/associated sxs/prior Treatment) HPI Comments: Patient with a history of polysubstance abuse, previous suicidal ideation and previous self-mutilation (cutting) presents with complaint of recurrent suicidal ideations. He states he feels he has worsened to the point where he is hearing a voice telling him "to go ahead and do it" with regard to killing himself. He feels he is overwhelmed by life circumstances and cannot cope. He states he used cocaine and drank alcohol today to kill the pain.  The history is provided by the patient. No language interpreter was used.    Past Medical History  Diagnosis Date  . Substance abuse   . Depression    History reviewed. No pertinent past surgical history. History reviewed. No pertinent family history. Social History  Substance Use Topics  . Smoking status: Current Every Day Smoker  . Smokeless tobacco: None  . Alcohol Use: Yes    Review of Systems  Constitutional: Negative for fever and chills.  HENT: Negative.   Respiratory: Negative.   Cardiovascular: Negative.   Gastrointestinal: Negative.   Musculoskeletal: Negative.   Skin: Negative.   Neurological: Negative.   Psychiatric/Behavioral: Positive for suicidal ideas, hallucinations and dysphoric mood.      Allergies  Review of patient's allergies indicates no known allergies.  Home Medications   Prior to Admission medications   Medication Sig Start Date End Date Taking? Authorizing Provider  FLUoxetine (PROZAC) 20 MG capsule Take 1 capsule (20 mg total) by mouth daily. 08/01/14   Beau FannyJohn C Withrow, FNP  ibuprofen (ADVIL,MOTRIN) 200 MG tablet Take 400 mg by mouth every 6 (six) hours as needed for headache or moderate pain.    Historical Provider, MD   traZODone (DESYREL) 100 MG tablet Take 1 tablet (100 mg total) by mouth at bedtime. 08/01/14   Everardo AllJohn C Withrow, FNP   BP 128/85 mmHg  Pulse 106  Temp(Src) 98.2 F (36.8 C) (Oral)  Resp 20  SpO2 100% Physical Exam  Constitutional: He is oriented to person, place, and time. He appears well-developed and well-nourished.  HENT:  Head: Normocephalic.  Neck: Normal range of motion. Neck supple.  Cardiovascular: Normal rate and regular rhythm.   Pulmonary/Chest: Effort normal and breath sounds normal.  Abdominal: Soft. Bowel sounds are normal. There is no tenderness. There is no rebound and no guarding.  Musculoskeletal: Normal range of motion.  Neurological: He is alert and oriented to person, place, and time.  Skin: Skin is warm and dry. No rash noted.  Psychiatric: His speech is normal and behavior is normal. He exhibits a depressed mood. He expresses suicidal ideation.    ED Course  Procedures (including critical care time) Labs Review Labs Reviewed  COMPREHENSIVE METABOLIC PANEL - Abnormal; Notable for the following:    Potassium 3.3 (*)    All other components within normal limits  ETHANOL - Abnormal; Notable for the following:    Alcohol, Ethyl (B) 22 (*)    All other components within normal limits  ACETAMINOPHEN LEVEL - Abnormal; Notable for the following:    Acetaminophen (Tylenol), Serum <10 (*)    All other components within normal limits  CBC - Abnormal; Notable for the following:    WBC 11.6 (*)    All other components within normal limits  URINE RAPID  DRUG SCREEN, HOSP PERFORMED - Abnormal; Notable for the following:    Cocaine POSITIVE (*)    All other components within normal limits  SALICYLATE LEVEL    Imaging Review No results found. I have personally reviewed and evaluated these images and lab results as part of my medical decision-making.   EKG Interpretation None      MDM   Final diagnoses:  None    1. Suicidal ideation 2. Depression 3.  Polysubstance abuse  The patient will need TTS consultation to determine placement.   2:40: Patient has been accepted at [facility] for treatment.     Elpidio Anis, PA-C 08/04/15 0981  Zadie Rhine, MD 08/04/15 [phone]

## 2015-08-04 NOTE — BH Assessment (Signed)
Voluntary paperwork completed. Informed Dr. Bebe ShaggyWickline of disposition. [facility] to the services of Dr. Jama Flavorsobos.

## 2015-08-04 NOTE — BH Assessment (Signed)
[facility] Assessment Progress Note   Called and scheduled pt's tele assessment with this clinician.  Attempted to call EDP Wickline, no answer, will call back.  Casimer LaniusKristen Chezney Huether, MS, [facility] Therapeutic Triage Specialist [facility]

## 2015-08-04 NOTE — Progress Notes (Signed)
Patient ID: Brian Nolan, male   DOB: 1981/01/22, 34 y.o.   MRN: [id number]  Adult Psychoeducational Group Note  Date:  08/04/2015 Time: 09:40am  Group Topic/Focus:  Healthy Communication:   The focus of this group is to discuss communication, barriers to communication, as well as healthy ways to communicate with others.  Participation Level:  Minimal  Participation Quality:  Appropriate  Affect:  Appropriate  Cognitive:  Drowsy  Insight: Improving  Engagement in Group:  Improving  Modes of Intervention:  Activity, Discussion, Education, Orientation and Support  Additional Comments:  Pt attended group today. Pt spoke when prompted but forwarded little.   Aurora Maskwyman, Brian Nolan E 08/04/2015, 11:50 AM

## 2015-08-04 NOTE — Progress Notes (Addendum)
D: Pt is a 34 year old male [facility] for SI, depression, substance abuse, and hallucinations.  He reports he is here because "depression, having thoughts of doing things to myself.  [place] going back and forth."  Pt reports he then called 911 and was brought to the hospital.  He reports he was having command auditory hallucinations when he was standing over the bridge.  He describes the hallucinations as "basically telling me to.. Kept me right there on that bridge."  He reports he had visual hallucinations earlier this week and describes them as "seeing shadows or figures moving."  Pt denies HI, denies hallucinations during assessment.  He has anxious, depressed affect and mood.  Pt reports passive SI during assessment.  He verbally contracts for safety.  He is tremulous, anxious, and sweating during assessment.  He scored a 31 on the alcohol screening questionnaire and his CIWA was 11.  Pt reports he drinks frequently (amount varies), and uses "crack cocaine" frequently (amount varies).  Pt seems genuinely focused on getting treatment, stating " I really need some help, I really need it, I'm afraid for my life if I keep going down this path."  Pt reports minimal support system, telling writer that his girlfriend is supportive at times.  He has had multiple suicide attempts, including: cutting, overdose, and trying to shoot himself.  His reported stressors are: "relationship issues, children issues, addiction issues, recent unemployment."    A: Introduced self to pt.  Admission process and paperwork completed with pt.  Non-invasive body assessment completed.  Pt has superficial abrasions to right arm, left hand, and on his neck.  He has tattoos on both arms.  Belongings searched for contraband and items not allowed on unit are stored in locker 10.  Pt oriented to unit.  On-call provider contacted and orders placed.  PRN medication administered  for CIWA>10.  Pt provided with meal.  PO fluids encouraged and provided.  Encouraged and supported pt.  Actively listened to pt.  Ensure and nicotine patch ordered.    R: Pt is cooperative with admission process.  He verbally contracts for safety.  Pt reports that he will inform staff of needs and concerns.  He is currently in his room and is safe on the unit.  Will continue to monitor and assess.
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
## 2015-08-04 NOTE — Progress Notes (Signed)
Pt. Denies pain, pt[facility].  Pt. Is calm and cooperative.  Pt. Is safely escorted off the unit by [employer] Driver

## 2015-08-04 NOTE — Tx Team (Signed)
Initial Interdisciplinary Treatment Plan   PATIENT STRESSORS: Financial difficulties Marital or family conflict Occupational concerns Substance abuse   PATIENT STRENGTHS: Ability for insight Average or above average intelligence Communication skills General fund of knowledge Motivation for treatment/growth   PROBLEM LIST: Problem List/Patient Goals Date to be addressed Date deferred Reason deferred Estimated date of resolution  "get clean"  08/04/15     "to somehow be able to get rid of the thoughts of harming myself"  08/04/15           SI 08/04/15     depression 08/04/15     Substance abuse 08/04/15                        DISCHARGE CRITERIA:  Improved stabilization in mood, thinking, and/or behavior Need for constant or close observation no longer present Withdrawal symptoms are absent or subacute and managed without 24-hour nursing intervention  PRELIMINARY DISCHARGE PLAN: Attend aftercare/continuing care group Return to previous living arrangement  PATIENT/FAMIILY INVOLVEMENT: This treatment plan has been presented to and reviewed with the patient, Brian Nolan.  The patient and family have been given the opportunity to ask questions and make suggestions.  Arrie AranChurch, Neena Beecham J 08/04/2015, 5:00 AM

## 2015-08-05 MED ORDER — POTASSIUM CHLORIDE CRYS ER 20 MEQ PO TBCR
20.0000 meq | EXTENDED_RELEASE_TABLET | Freq: Two times a day (BID) | ORAL | Status: AC
Start: 2015-08-05 — End: 2015-08-06
  Administered 2015-08-05 – 2015-08-06 (×2): 20 meq via ORAL
  Filled 2015-08-05 (×2): qty 1

## 2015-08-05 MED ORDER — POTASSIUM CHLORIDE CRYS ER 20 MEQ PO TBCR: 20.0000 meq | EXTENDED_RELEASE_TABLET | Freq: Two times a day (BID) | ORAL | Status: DC

## 2015-08-05 NOTE — Progress Notes (Signed)
D: Pt has anxious affect and mood.  When asked about his day, pt reports "it was great."  Pt reported that he finds the groups very helpful and states "it felt good knowing other people have similar things going on, you know, I feel like I can do some things."  Pt denies SI/HI, denies hallucinations, denies pain.  He has withdrawal symptoms of tremors and anxiety.  Pt has been visible in milieu interacting with peers and staff appropriately.  Pt attended evening group.   A: Met with pt 1:1 and provided support and encouragement.  Actively listened to pt.  Medications administered per order.   R: Pt is compliant with medications.  Pt verbally contracts for safety.  Will continue to monitor and assess.

## 2015-08-05 NOTE — Plan of Care (Signed)
Problem: Diagnosis: Increased Risk For Suicide Attempt Goal: STG-Patient Will Attend All Groups On The Unit Outcome: Progressing Pt attended evening group on 08/04/15.

## 2015-08-05 NOTE — [facility] Group Notes (Signed)
[facility] Group Notes:  (Clinical Social Work)   08/05/2015  1:15-2:15PM  Summary of Progress/Problems:   The main focus of today's process group was to   1)  discuss the importance of adding supports  2)  define health supports versus unhealthy supports  3)  identify the patient's current unhealthy supports and plan how to handle them  4)  Identify the patient's current healthy supports and plan what to add.  An emphasis was placed on using counselor, doctor, therapy groups, 12-step groups, and problem-specific support groups to expand supports.    The patient expressed full comprehension of the concepts presented, and agreed that there is a need to add more supports.  The patient stated that he has started to return to church, has found a great deal of comfort there every time, and has several pastor friends who are always available to him to talk.  He spoke of one friend who was supposed to meet up with him Friday night and "had tires on car fixed instead, but that's not even possible at 10 o'clock at night."  Group pointed out to him that he is making assumptions and is giving up on a friendship possibly without knowing the full story.  He interacted throughout the entire group with questions, suggestions, and talking about fears.  Type of Therapy:  Process Group with Motivational Interviewing  Participation Level:  Active  Participation Quality:  Attentive, Sharing and Supportive  Affect:  Blunted  Cognitive:  Alert, Appropriate and Oriented  Insight:  Engaged  Engagement in Therapy:  Engaged  Modes of Intervention:   Education, Support and Processing, Activity  Brian MantleMareida Grossman-Orr, LCSW 08/05/2015    4:24 PM

## 2015-08-05 NOTE — Progress Notes (Signed)
[facility] MD Progress Note  08/05/2015 11:06 AM Brian Nolan  MRN:  [id number] Subjective:  Brian Nolan is awake, alert and oriented X4 , found resting in bedroom.  Denies suicidal or homicidal ideation. Denies auditory or visual hallucination and does not appear to be responding to internal stimuli. Patient interacts well with staff and others. Patient reports he is medication compliant without mediation side effects.  Rumination with long term treatment facility for cocaine addiction.States his depression 4/10.  discharge. Support, encouragement and reassurance was provided.  Principal Problem: Major depressive disorder, single episode, severe with psychotic features ([place]) Diagnosis:   Patient Active Problem List   Diagnosis Date Noted  . Major depressive disorder, single episode, severe with psychotic features ([place]) [F32.3] 08/04/2015   Total Time spent with patient: 45 minutes  Past Psychiatric History: MDD, Polysubstance abuse  Past Medical History:  Past Medical History  Diagnosis Date  . Substance abuse   . Depression    History reviewed. No pertinent past surgical history. Family History: History reviewed. No pertinent family history. Family Psychiatric  History: SEE SRA Social History:  History  Alcohol Use  . Yes     History  Drug Use  . Yes  . Special: "Crack" cocaine    Social History   Social History  . Marital Status: Single    Spouse Name: N/A  . Number of Children: N/A  . Years of Education: N/A   Social History Main Topics  . Smoking status: Current Every Day Smoker  . Smokeless tobacco: None  . Alcohol Use: Yes  . Drug Use: Yes    Special: "Crack" cocaine  . Sexual Activity: Not Asked   Other Topics Concern  . None   Social History Narrative   Additional Social History:    Pain Medications: denies Prescriptions: denies Over the Counter: denies History of alcohol / drug use?: Yes Longest period of sobriety (when/how long): unsure Negative  Consequences of Use: Museum/gallery curator, Personal relationships, Work / Youth worker Withdrawal Symptoms: Tremors Name of Substance 1: Alcohol 1 - Age of First Use: 11 1 - Amount (size/oz): varies-has been drinking beer and wine 1 - Frequency: varies, has more than 4 drinks on one occasion weekly 1 - Duration: ongoing 1 - Last Use / Amount: yesterday "a lot"  Name of Substance 2: Crack cocaine 2 - Age of First Use: 17 2 - Amount (size/oz): varies when he can get it  2 - Frequency: varies weekly 2 - Duration: ongoing                Sleep: Fair  Appetite:  Fair  Current Medications: Current Facility-Administered Medications  Medication Dose Route Frequency Provider Last Rate Last Dose  . acetaminophen (TYLENOL) tablet 650 mg  650 mg Oral Q6H PRN Harriet Butte, NP      . alum & mag hydroxide-simeth (MAALOX/MYLANTA) 200-200-20 MG/5ML suspension 30 mL  30 mL Oral Q4H PRN Harriet Butte, NP      . chlordiazePOXIDE (LIBRIUM) capsule 25 mg  25 mg Oral Q6H PRN Harriet Butte, NP   25 mg at 08/04/15 0453  . chlordiazePOXIDE (LIBRIUM) capsule 25 mg  25 mg Oral TID Harriet Butte, NP   25 mg at 08/05/15 3220   Followed by  . [START ON 08/06/2015] chlordiazePOXIDE (LIBRIUM) capsule 25 mg  25 mg Oral BH-qamhs Harriet Butte, NP       Followed by  . [START ON 08/07/2015] chlordiazePOXIDE (LIBRIUM) capsule 25 mg  25 mg  Oral Daily Harriet Butte, NP      . feeding supplement (ENSURE ENLIVE) (ENSURE ENLIVE) liquid 237 mL  237 mL Oral BID BM Myer Peer Cobos, MD   237 mL at 08/04/15 1507  . FLUoxetine (PROZAC) capsule 20 mg  20 mg Oral Daily Harriet Butte, NP   20 mg at 08/05/15 0831  . hydrOXYzine (ATARAX/VISTARIL) tablet 25 mg  25 mg Oral Q6H PRN Harriet Butte, NP      . loperamide (IMODIUM) capsule 2-4 mg  2-4 mg Oral PRN Harriet Butte, NP      . magnesium hydroxide (MILK OF MAGNESIA) suspension 30 mL  30 mL Oral Daily PRN Harriet Butte, NP      . multivitamin with minerals tablet 1 tablet  1  tablet Oral Daily Harriet Butte, NP   1 tablet at 08/05/15 0831  . nicotine (NICODERM CQ - dosed in mg/24 hours) patch 21 mg  21 mg Transdermal Daily Jenne Campus, MD   21 mg at 08/05/15 4709  . ondansetron (ZOFRAN-ODT) disintegrating tablet 4 mg  4 mg Oral Q6H PRN Harriet Butte, NP      . thiamine (B-1) injection 100 mg  100 mg Intramuscular Once Harriet Butte, NP   100 mg at 08/04/15 0450  . thiamine (VITAMIN B-1) tablet 100 mg  100 mg Oral Daily Harriet Butte, NP   100 mg at 08/05/15 0831  . traZODone (DESYREL) tablet 100 mg  100 mg Oral QHS Harriet Butte, NP   100 mg at 08/04/15 2107    Lab Results:  Results for orders placed or performed during the hospital encounter of 08/04/15 (from the past 48 hour(s))  Urine rapid drug screen (hosp performed) (Not at [facility])     Status: Abnormal   Collection Time: 08/04/15 12:50 AM  Result Value Ref Range   Opiates NONE DETECTED NONE DETECTED   Cocaine POSITIVE (A) NONE DETECTED   Benzodiazepines NONE DETECTED NONE DETECTED   Amphetamines NONE DETECTED NONE DETECTED   Tetrahydrocannabinol NONE DETECTED NONE DETECTED   Barbiturates NONE DETECTED NONE DETECTED    Comment:        DRUG SCREEN FOR MEDICAL PURPOSES ONLY.  IF CONFIRMATION IS NEEDED FOR ANY PURPOSE, NOTIFY LAB WITHIN 5 DAYS.        LOWEST DETECTABLE LIMITS FOR URINE DRUG SCREEN Drug Class       Cutoff (ng/mL) Amphetamine      1000 Barbiturate      200 Benzodiazepine   628 Tricyclics       366 Opiates          300 Cocaine          300 THC              50   Comprehensive metabolic panel     Status: Abnormal   Collection Time: 08/04/15 12:57 AM  Result Value Ref Range   Sodium 138 135 - 145 mmol/L   Potassium 3.3 (L) 3.5 - 5.1 mmol/L   Chloride 102 101 - 111 mmol/L   CO2 26 22 - 32 mmol/L   Glucose, Bld 88 65 - 99 mg/dL   BUN 10 6 - 20 mg/dL   Creatinine, Ser 1.10 0.61 - 1.24 mg/dL   Calcium 9.1 8.9 - 10.3 mg/dL   Total Protein 7.1 6.5 - 8.1 g/dL   Albumin  4.1 3.5 - 5.0 g/dL   AST 32 15 - 41 U/L  ALT 43 17 - 63 U/L   Alkaline Phosphatase 64 38 - 126 U/L   Total Bilirubin 1.0 0.3 - 1.2 mg/dL   GFR calc non Af Amer >60 >60 mL/min   GFR calc Af Amer >60 >60 mL/min    Comment: (NOTE) The eGFR has been calculated using the CKD EPI equation. This calculation has not been validated in all clinical situations. eGFR's persistently <60 mL/min signify possible Chronic Kidney Disease.    Anion gap 10 5 - 15  Ethanol (ETOH)     Status: Abnormal   Collection Time: 08/04/15 12:57 AM  Result Value Ref Range   Alcohol, Ethyl (B) 22 (H) <5 mg/dL    Comment:        LOWEST DETECTABLE LIMIT FOR SERUM ALCOHOL IS 5 mg/dL FOR MEDICAL PURPOSES ONLY   Salicylate level     Status: None   Collection Time: 08/04/15 12:57 AM  Result Value Ref Range   Salicylate Lvl <0.3 2.8 - 30.0 mg/dL  Acetaminophen level     Status: Abnormal   Collection Time: 08/04/15 12:57 AM  Result Value Ref Range   Acetaminophen (Tylenol), Serum <10 (L) 10 - 30 ug/mL    Comment:        THERAPEUTIC CONCENTRATIONS VARY SIGNIFICANTLY. A RANGE OF 10-30 ug/mL MAY BE AN EFFECTIVE CONCENTRATION FOR MANY PATIENTS. HOWEVER, SOME ARE BEST TREATED AT CONCENTRATIONS OUTSIDE THIS RANGE. ACETAMINOPHEN CONCENTRATIONS >150 ug/mL AT 4 HOURS AFTER INGESTION AND >50 ug/mL AT 12 HOURS AFTER INGESTION ARE OFTEN ASSOCIATED WITH TOXIC REACTIONS.   CBC     Status: Abnormal   Collection Time: 08/04/15 12:57 AM  Result Value Ref Range   WBC 11.6 (H) 4.0 - 10.5 K/uL   RBC 5.56 4.22 - 5.81 MIL/uL   Hemoglobin 17.0 13.0 - 17.0 g/dL   HCT 49.1 39.0 - 52.0 %   MCV 88.3 78.0 - 100.0 fL   MCH 30.6 26.0 - 34.0 pg   MCHC 34.6 30.0 - 36.0 g/dL   RDW 13.2 11.5 - 15.5 %   Platelets 208 150 - 400 K/uL    Physical Findings: AIMS: Facial and Oral Movements Muscles of Facial Expression: None, normal Lips and Perioral Area: None, normal Jaw: None, normal Tongue: None, normal,Extremity  Movements Upper (arms, wrists, hands, fingers): Minimal Lower (legs, knees, ankles, toes): Minimal, Trunk Movements Neck, shoulders, hips: None, normal, Overall Severity Severity of abnormal movements (highest score from questions above): None, normal Incapacitation due to abnormal movements: None, normal Patient's awareness of abnormal movements (rate only patient's report): No Awareness, Dental Status Current problems with teeth and/or dentures?: No Does patient usually wear dentures?: No  CIWA:  CIWA-Ar Total: 3 COWS:  COWS Total Score: 10  Musculoskeletal: Strength & Muscle Tone: within normal limits Gait & Station: normal Patient leans: N/A  Psychiatric Specialty Exam: Review of Systems  Constitutional: Negative.   HENT: Negative.   Respiratory: Negative.   Cardiovascular: Negative.   Gastrointestinal: Negative.   Genitourinary: Negative.   Neurological: Negative.   Endo/Heme/Allergies: Negative.   Psychiatric/Behavioral: Positive for depression and substance abuse. The patient is nervous/anxious.   All other systems reviewed and are negative.   Blood pressure 125/73, pulse 102, temperature 97.4 F (36.3 C), temperature source Oral, resp. rate 16, height 5' 9"  (1.753 m), weight 87.544 kg (193 lb), SpO2 98 %.Body mass index is 28.49 kg/(m^2).  General Appearance: Casual  Eye Contact::  Fair  Speech:  Clear and Coherent and Normal Rate  Volume:  Normal  Mood:  Anxious and Depressed  Affect:  Appropriate and Depressed  Thought Process:  Coherent, Linear and Logical  Orientation:  Full (Time, Place, and Person)  Thought Content:  Hallucinations: none and Rumination with worries of discharge plan and job interview  Suicidal Thoughts:  No  Homicidal Thoughts:  No  Memory:  Immediate;   Fair Recent;   Fair Remote;   Fair  Judgement:  Fair  Insight:  Fair  Psychomotor Activity:  Restlessness  Concentration:  Fair  Recall:  [employer] of Knowledge:Fair  Language: Good   Akathisia:  No  Handed:  Right  AIMS (if indicated):     Assets:  Communication Skills Desire for Improvement Financial Resources/Insurance Social Support  ADL's:  Intact  Cognition: WNL  Sleep:  Number of Hours: 6.75     I agree with current treatment plan on 08/05/2015, Patient seen face-to-face for psychiatric evaluation follow-up, chart reviewed.  Treatment Plan Summary: Daily contact with patient to assess and evaluate symptoms and progress in treatment and Medication management  Continue Prozac 20 mg for mood stabilization. Continue with Trazodone 100 mg for insomnia Start Potassium Chloride SA (K-DUR, K LOR-CON) 20 mEq X 1 day for low potassium recheck level on 12/5//2015  Started on CWIA/ Librium Protocol Will continue to monitor vitals ,medication compliance and treatment side effects while patient is here.  Reviewed labs Glucose WNL, elevated ,BAL - 22, UDS +pos for cocaine. CSW will start working on disposition.  Patient to participate in therapeutic milieu Derrill Center FNP-BC 08/05/2015, 11:06 AM

## 2015-08-05 NOTE — Progress Notes (Signed)
Adult Psychoeducational Group Note  Date:  08/05/2015 Time:  9:59 PM  Group Topic/Focus:  Wrap-Up Group:   The focus of this group is to help patients review their daily goal of treatment and discuss progress on daily workbooks.  Participation Level:  Active  Participation Quality:  Appropriate  Affect:  Appropriate  Cognitive:  Appropriate  Insight: Appropriate  Engagement in Group:  Engaged  Modes of Intervention:  Discussion  Additional Comments:  Patient stated his goal for today was to figure out a plan for when he leaves. Patient stated he rested well.   Aftyn Nott L Riane Rung 08/05/2015, 9:59 PM

## 2015-08-05 NOTE — [facility] Group Notes (Addendum)
[facility] Group Notes: (Nursing/MHT/Case Management/Adjunct)  Date: 08/05/2015  Time: 2:29 PM  Type of Therapy: Psychoeducational Skills  Participation Level: Did Not Attend  Participation Quality: N/A  Affect: N/A  Cognitive: N/A  Insight: None  Engagement in Group: None  Modes of Intervention: Discussion and Education  Summary of Progress/Problems: The purpose of this group is to discuss healthy support systems. Patient's also were able to focus on perspectives and think about their irrational thoughts and asked to name one. He was invited to group but did not attend.   Marzetta BoardDopson, Brian Nolan 08/05/2015, 2:29 PM

## 2015-08-05 NOTE — Progress Notes (Signed)
Nutrition Brief Note  Patient identified on the Malnutrition Screening Tool (MST) Report  Wt Readings from Last 15 Encounters:  08/04/15 193 lb (87.544 kg)    Body mass index is 28.49 kg/(m^2). Patient meets criteria for overweight based on current BMI.   Diet Order: Diet regular Room service appropriate?: Yes; Fluid consistency:: Thin Pt is also offered choice of unit snacks mid-morning and mid-afternoon.  Pt is eating as desired.   Labs and medications reviewed.   No nutrition interventions warranted at this time. If nutrition issues arise, please consult RD.   Tilda FrancoLindsey Elwyn Klosinski, MS, RD, LDN Pager: [phone] After Hours Pager: [phone]

## 2015-08-06 LAB — POTASSIUM: Potassium: 4 mmol/L (ref 3.5–5.1)

## 2015-08-06 MED ORDER — TRAZODONE HCL 100 MG PO TABS
100.0000 mg | ORAL_TABLET | Freq: Every evening | ORAL | Status: DC | PRN
  Administered 2015-08-06 – 2015-08-07 (×2): 100 mg via ORAL
  Filled 2015-08-06: qty 7
  Filled 2015-08-06: qty 1

## 2015-08-06 NOTE — Progress Notes (Signed)
Adult Psychoeducational Group Note  Date:  08/06/2015 Time:  9:36 PM  Group Topic/Focus:  Wrap-Up Group:   The focus of this group is to help patients review their daily goal of treatment and discuss progress on daily workbooks.  Participation Level:  Active  Participation Quality:  Appropriate  Affect:  Appropriate  Cognitive:  Alert  Insight: Appropriate  Engagement in Group:  Engaged  Modes of Intervention:  Discussion  Additional Comments:  Patient stated having a good day. Patient stated his goal for today was to work towards discharge tomorrow.   Toni Hoffmeister L Lesbia Ottaway 08/06/2015, 9:36 PM

## 2015-08-06 NOTE — [facility] Suicide Risk Assessment (Signed)
[facility] INPATIENT:  Family/Significant Other Suicide Prevention Education  Suicide Prevention Education:  Patient Refusal for Family/Significant Other Suicide Prevention Education: The patient Brian Nolan has refused to provide written consent for family/significant other to be provided Family/Significant Other Suicide Prevention Education during admission and/or prior to discharge.  Physician notified. SPE reviewed with patient and brochure provided. Patient encouraged to return to hospital if having suicidal thoughts, patient verbalized his/her understanding and has no further questions at this time.   [name], [name] 08/06/2015, 4:05 PM

## 2015-08-06 NOTE — Progress Notes (Signed)
D: Pt has appropriate affect and pleasant mood.  Pt reports his day was "great."  Pt reports his goal was to "work on discharge plans, I got some ideas, I need to talk to the social worker to see what's available for when I leave here."  Pt denies SI/HI, denies hallucinations, denies pain.  Pt has withdrawal symptom of tremors.  Pt has been visible in milieu interacting with peers and staff appropriately.  Pt attended evening group.   A: Met with pt 1:1 and provided support and encouragement.  Actively listened to pt.  Medications administered per order.   R: Pt is compliant with medications.  Pt verbally contracts for safety.  Will continue to monitor and assess.

## 2015-08-06 NOTE — Tx Team (Signed)
Interdisciplinary Treatment Plan Update (Adult) Date: 08/06/2015   Date: 08/06/2015 4:08 PM  Progress in Treatment:  Attending groups: Yes, intermittently  Participating in groups: Yes  Taking medication as prescribed: Yes  Tolerating medication: Yes  Family/Significant othe contact made: No, Pt declines Patient understands diagnosis: Yes  Discussing patient identified problems/goals with staff: Yes  Medical problems stabilized or resolved: Yes  Denies suicidal/homicidal ideation: Yes Patient has not harmed self or Others: Yes   New problem(s) identified: None identified at this time.   Discharge Plan or Barriers: Pt requesting referral to [facility]. He can also discharge home with outpatient follow-up  Additional comments:  Patient and CSW reviewed pt's identified goals and treatment plan. Patient verbalized understanding and agreed to treatment plan. CSW reviewed [facility] "Discharge Process and Patient Involvement" Form. Pt verbalized understanding of information provided and signed form.   Reason for Continuation of Hospitalization:  Depression Medication stabilization Suicidal ideation Withdrawal symptoms Psychotic  Estimated length of stay: 1-3 days  Review of initial/current patient goals per problem list:   1.  Goal(s): Patient will participate in aftercare plan  Met:  Progressing  Target date: 3-5 days from date of admission   As evidenced by: Patient will participate within aftercare plan AEB aftercare provider and housing plan at discharge being identified.   08/06/15: Pt requesting referral to [facility]; CSW to make referral  2.  Goal (s): Patient will exhibit decreased depressive symptoms and suicidal ideations.  Met:  Yes  Target date: 3-5 days from date of admission   As evidenced by: Patient will utilize self rating of depression at 3 or below and demonstrate decreased signs of depression or be deemed stable for discharge by MD.  08/06/15:  Pt rates depression at 0/10 to CSW; denies SI  4.  Goal(s): Patient will demonstrate decreased signs of withdrawal due to substance abuse  Met:  Yes  Target date: 3-5 days from date of admission   As evidenced by: Patient will produce a CIWA/COWS score of 0, have stable vitals signs, and no symptoms of withdrawal  08/06/15: Pt denies symptoms of withdrawal and is not observed to be exhibiting these symptoms 5.  Goal(s): Patient will demonstrate decreased signs of psychosis  . Met:  Yes . Target date: 3-5 days from date of admission  . As evidenced by: Patient will demonstrate decreased frequency of AVH or return to baseline function    /08/06/15: Pt denies AVH and is not observed to be responding to internal stimuli.  Attendees:  Patient:    Family:    Physician: Dr. Parke Poisson, MD  08/06/2015 4:08 PM  Nursing: Lars Pinks, RN Case manager  08/06/2015 4:08 PM  Clinical Social Worker Peri Maris, [place] 08/06/2015 4:08 PM  Other:  08/06/2015 4:08 PM  Clinical: Darrol Angel, RN 08/06/2015 4:08 PM  Other: , RN Charge Nurse 08/06/2015 4:08 PM  Other:     Peri Maris, [place] Work [phone]
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
## 2015-08-06 NOTE — Progress Notes (Signed)
Patient ID: Brian Nolan, male   DOB: Sep 23, 1980, 34 y.o.   MRN: [id number] [facility] MD Progress Note  08/06/2015 1:03 PM Brian Nolan  MRN:  [id number] Subjective:  Patient states he is doing well, and currently denies depression, sadness, states mood is " good today", denies psychotic symptoms. Denies medication side effects. Objective : Patient case discussed with treatment team and patient seen. 34 year old male, lives with SO, states currently unemployed but happy that he has a job interview later this week. History of substance abuse, mainly cocaine and alcohol. At this time not presenting with any alcohol WDL symptoms ( currently on Librium). No restlessness, no anxiety, vitals stable . Patient has subtle distal tremors, but states these not related to alcohol WDL, and reports them as chronic, dating back years. States several members of his family have similar tremors, and feels they may be essential tremors. Behavior on unit calm, in good control. Some group participation, no disruptive behaviors. Focused on being discharged soon, stating he has a job interview later this week, and feeling " good today, ready to be discharged ". Denies cravings for alcohol or cocaine at this time. Labs K+ WNL.  Principal Problem: Major depressive disorder, single episode, severe with psychotic features (HCC) Diagnosis:   Patient Active Problem List   Diagnosis Date Noted  . Major depressive disorder, single episode, severe with psychotic features (HCC) [F32.3] 08/04/2015   Total Time spent with patient:  20 minutes   Past Psychiatric History: MDD, Polysubstance abuse  Past Medical History:  Past Medical History  Diagnosis Date  . Substance abuse   . Depression    History reviewed. No pertinent past surgical history. Family History: History reviewed. No pertinent family history. Family Psychiatric  History: SEE SRA Social History:  History  Alcohol Use  . Yes     History  Drug Use  . Yes  .  Special: "Crack" cocaine    Social History   Social History  . Marital Status: Single    Spouse Name: N/A  . Number of Children: N/A  . Years of Education: N/A   Social History Main Topics  . Smoking status: Current Every Day Smoker  . Smokeless tobacco: None  . Alcohol Use: Yes  . Drug Use: Yes    Special: "Crack" cocaine  . Sexual Activity: Not Asked   Other Topics Concern  . None   Social History Narrative   Additional Social History:    Pain Medications: denies Prescriptions: denies Over the Counter: denies History of alcohol / drug use?: Yes Longest period of sobriety (when/how long): unsure Negative Consequences of Use: Surveyor, quantity, Personal relationships, Work / Programmer, multimedia Withdrawal Symptoms: Tremors Name of Substance 1: Alcohol 1 - Age of First Use: 11 1 - Amount (size/oz): varies-has been drinking beer and wine 1 - Frequency: varies, has more than 4 drinks on one occasion weekly 1 - Duration: ongoing 1 - Last Use / Amount: yesterday "a lot"  Name of Substance 2: Crack cocaine 2 - Age of First Use: 17 2 - Amount (size/oz): varies when he can get it  2 - Frequency: varies weekly 2 - Duration: ongoing  Sleep: improved   Appetite:  Improved   Current Medications: Current Facility-Administered Medications  Medication Dose Route Frequency Provider Last Rate Last Dose  . acetaminophen (TYLENOL) tablet 650 mg  650 mg Oral Q6H PRN Worthy Flank, NP      . alum & mag hydroxide-simeth (MAALOX/MYLANTA) 200-200-20 MG/5ML suspension 30 mL  30 mL Oral Q4H PRN Worthy FlankIjeoma E Nwaeze, NP      . chlordiazePOXIDE (LIBRIUM) capsule 25 mg  25 mg Oral Q6H PRN Worthy FlankIjeoma E Nwaeze, NP   25 mg at 08/04/15 0453  . chlordiazePOXIDE (LIBRIUM) capsule 25 mg  25 mg Oral BH-qamhs Worthy FlankIjeoma E Nwaeze, NP   25 mg at 08/06/15 [id number]   Followed by  . [START ON 08/07/2015] chlordiazePOXIDE (LIBRIUM) capsule 25 mg  25 mg Oral Daily Worthy FlankIjeoma E Nwaeze, NP      . feeding supplement (ENSURE ENLIVE) (ENSURE ENLIVE)  liquid 237 mL  237 mL Oral BID BM Rockey SituFernando A Encarnacion Scioneaux, MD   237 mL at 08/06/15 1046  . FLUoxetine (PROZAC) capsule 20 mg  20 mg Oral Daily Worthy FlankIjeoma E Nwaeze, NP   20 mg at 08/06/15 [id number]  . hydrOXYzine (ATARAX/VISTARIL) tablet 25 mg  25 mg Oral Q6H PRN Worthy FlankIjeoma E Nwaeze, NP      . loperamide (IMODIUM) capsule 2-4 mg  2-4 mg Oral PRN Worthy FlankIjeoma E Nwaeze, NP      . magnesium hydroxide (MILK OF MAGNESIA) suspension 30 mL  30 mL Oral Daily PRN Worthy FlankIjeoma E Nwaeze, NP      . multivitamin with minerals tablet 1 tablet  1 tablet Oral Daily Worthy FlankIjeoma E Nwaeze, NP   1 tablet at 08/06/15 0814  . nicotine (NICODERM CQ - dosed in mg/24 hours) patch 21 mg  21 mg Transdermal Daily Craige CottaFernando A Kyndall Chaplin, MD   21 mg at 08/06/15 0815  . ondansetron (ZOFRAN-ODT) disintegrating tablet 4 mg  4 mg Oral Q6H PRN Worthy FlankIjeoma E Nwaeze, NP      . thiamine (B-1) injection 100 mg  100 mg Intramuscular Once Worthy FlankIjeoma E Nwaeze, NP   100 mg at 08/04/15 0450  . thiamine (VITAMIN B-1) tablet 100 mg  100 mg Oral Daily Worthy FlankIjeoma E Nwaeze, NP   100 mg at 08/06/15 0814  . traZODone (DESYREL) tablet 100 mg  100 mg Oral QHS Worthy FlankIjeoma E Nwaeze, NP   100 mg at 08/05/15 2101    Lab Results:  Results for orders placed or performed during the hospital encounter of 08/04/15 (from the past 48 hour(s))  Potassium     Status: None   Collection Time: 08/06/15  6:26 AM  Result Value Ref Range   Potassium 4.0 3.5 - 5.1 mmol/L    Comment: [facility]formed [facility]    Physical Findings: AIMS: Facial and Oral Movements Muscles of Facial Expression: None, normal Lips and Perioral Area: None, normal Jaw: None, normal Tongue: None, normal,Extremity Movements Upper (arms, wrists, hands, fingers): Minimal Lower (legs, knees, ankles, toes): Minimal, Trunk Movements Neck, shoulders, hips: None, normal, Overall Severity Severity of abnormal movements (highest score from questions above): None, normal Incapacitation due to abnormal movements: None, normal Patient's  awareness of abnormal movements (rate only patient's report): No Awareness, Dental Status Current problems with teeth and/or dentures?: No Does patient usually wear dentures?: No  CIWA:  CIWA-Ar Total: 1 COWS:  COWS Total Score: 10  Musculoskeletal: Strength & Muscle Tone: within normal limits Gait & Station: normal Patient leans: N/A  Psychiatric Specialty Exam: Review of Systems  Constitutional: Negative.   HENT: Negative.   Respiratory: Negative.   Cardiovascular: Negative.   Gastrointestinal: Negative.   Genitourinary: Negative.   Neurological: Negative.   Endo/Heme/Allergies: Negative.   Psychiatric/Behavioral: Positive for depression and substance abuse. The patient is nervous/anxious.   All other systems reviewed and are negative. no headache, no chest pain, no  SOB  Blood pressure 111/72, pulse 97, temperature 98.6 F (37 C), temperature source Oral, resp. rate 18, height  (1.753 m), weight 193 lb (87.544 kg), SpO2 98 %.Body mass index is 28.49 kg/(m^2).  General Appearance: Casual  Eye Contact::  Good  Speech:  Clear and Coherent and Normal Rate  Volume:  Normal  Mood:  States mood is improved, and denies depression today  Affect:   Reactive, appropriate, vaguely anxious   Thought Process:  Linear   Orientation:  Full (Time, Place, and Person)  Thought Content:   Denies hallucinations and does not appear internally preoccupied, no delusions expressed  Suicidal Thoughts:  No- denies any suicidal or self injurious ideations  Homicidal Thoughts:  No- denies any homicidal or violent ideations  Memory:  Recent and remote grossly intact   Judgement:   improving  Insight:  Fair  Psychomotor Activity:  Normal  Concentration:  Good  Recall:  Good  Fund of Knowledge:Good  Language: Good  Akathisia:  No  Handed:  Right  AIMS (if indicated):     Assets:  Communication Skills Desire for Improvement Financial Resources/Insurance Social Support  ADL's:  Intact   Cognition: WNL  Sleep:  Number of Hours: 5.75   Assessment - patient has history of alcohol and cocaine abuse, and presented with depression and auditory hallucinations. At  This time not presenting with any significant withdrawal symptoms, and is on Librium detox protocol. At present no psychotic symptoms, not internally preoccupied. Tolerating Prozac trial well. At present his focus is on discharging soon as he states he is feeling much better and does not want to miss an upcoming job interview .     Treatment Plan Summary: Daily contact with patient to assess and evaluate symptoms and progress in treatment and Medication management Encourage ongoing group, milieu participation, to work on coping skills and symptom reduction. Continue Prozac 20 mg for depression and anxiety Continue  Trazodone 100 mg for insomnia as needed  Continue  CIWA/ Librium Protocol to minimize risk of alcohol WDL. Encourage efforts to work on early recovery and relapse prevention   Brian Sova  MD  08/06/2015, 1:03 PM

## 2015-08-06 NOTE — Plan of Care (Signed)
Problem: Alteration in mood & ability to function due to Goal: LTG-Pt reports reduction in suicidal thoughts (Patient reports reduction in suicidal thoughts and is able to verbalize a safety plan for whenever patient is feeling suicidal)  Outcome: Progressing Pt denies SI and verbally contracts for safety.       

## 2015-08-06 NOTE — Progress Notes (Signed)
D: Pt presents with flat affect and depressed mood. Pt minimal on approach and forwards little information. Pt rates depression 1/10. Pt appears to be minimizing his symptoms. Pt denies withdrawal symptoms.  Pt denies suicidal thoughts. Pt compliant with taking meds. No adverse reaction to meds verbalized by pt. A: Medications administered as ordered per MD. Verbal support given. Pt encouraged to attend groups. 15 minute checks performed for safety.  R: Pt safety maintained.

## 2015-08-06 NOTE — [facility] Group Notes (Signed)
[facility]  08/06/2015 8:45 AM  Pt did not attend, declined invitation.   Chad CordialLauren Carter, LCSWA 08/06/2015 12:11 PM

## 2015-08-06 NOTE — BHH Group Notes (Signed)
B[facility] LCSW Group Therapy  08/06/2015 1:15pm  Type of Therapy:  Group Therapy vercoming Obstacles  Pt did not attend, declined invitation.   Chad CordialLauren Carter, LCSWA 08/06/2015 4:01 PM

## 2015-08-07 NOTE — Progress Notes (Signed)
Recreation Therapy Notes  Animal-Assisted Activity (AAA) Program Checklist/Progress Notes Patient Eligibility Criteria Checklist & Daily Group note for Rec Tx Intervention  Date: 12.06.2016 Time: 2:45pm Location: [address] Dayroom    AAA/T Program Assumption of Risk Form signed by Patient/ or Parent Legal Guardian yes  Patient is free of allergies or sever asthma yes  Patient reports no fear of animals yes  Patient reports no history of cruelty to animals yes  Patient understands his/her participation is voluntary yes  Patient washes hands before animal contact yes  Patient washes hands after animal contact yes  Behavioral Response: Appropriate   Education: Hand Washing, Appropriate Animal Interaction   Education Outcome: Acknowledges education.   Clinical Observations/Feedback: Patient engaged appropriately with peers and therapy dog during session. Patient asked appropriate questions about therapy dog and his training.   Kathy Wahid L Issiac Jamar, LRT/CTRS  Lovett Coffin L 08/07/2015 3:03 PM 

## 2015-08-07 NOTE — BHH Group Notes (Signed)
[facility] LCSW Group Therapy  08/07/2015 1:35 PM  Type of Therapy:  Group Therapy  Participation Level:  Active  Participation Quality:  Attentive  Affect:  Appropriate  Cognitive:  Alert and Oriented  Insight:  Improving  Engagement in Therapy:  Engaged  Modes of Intervention:  Discussion, Education, Exploration, Problem-solving, Rapport Building, Socialization and Support  Summary of Progress/Problems: [facility] Speaker came to talk about his personal journey with substance abuse and addiction. The pt processed ways by which to relate to the speaker. MHA speaker provided handouts and educational information pertaining to groups and services offered by the [facility].   Smart, Brian Franken LCSW 08/07/2015, 1:35 PM

## 2015-08-07 NOTE — Progress Notes (Signed)
Patient ID: Brian Nolan, male   DOB: 06/20/1981, 34 y.o.   MRN: [id number] [facility] MD Progress Note  08/07/2015 10:40 AM Sameul Tagle  MRN:  [id number] Subjective:  Patient reports he is feeling "OK".  Describes some mild  cravings for alcohol, but states he is motivated in treatment , sobriety.  Denies medication side effects. Objective : Patient case discussed with treatment team and patient seen. At present patient not presenting with any symptoms of alcohol WDL.  No tremors, no diaphoresis, no restlessness, vitals stable. On unit behavior in good control, no agitated behaviors or presentation. As discussed with staff, patient continues to express interest in going to a rehab setting on discharge. He also, however, has expressed having an upcoming job interview and wanting to start working soon. We discussed this and reviewed how , if he goes to Rehab soon, he would need to defer job until his completion of rehab program.   Patient reported he would consider these issues. I have encouraged him to consider going to a Rehab setting if possible, in order to focus on early recovery and relapse prevention efforts   Principal Problem: Major depressive disorder, single episode, severe with psychotic features (HCC) Diagnosis:   Patient Active Problem List   Diagnosis Date Noted  . Major depressive disorder, single episode, severe with psychotic features (HCC) [F32.3] 08/04/2015   Total Time spent with patient:  20 minutes   Past Psychiatric History: MDD, Polysubstance abuse  Past Medical History:  Past Medical History  Diagnosis Date  . Substance abuse   . Depression    History reviewed. No pertinent past surgical history. Family History: History reviewed. No pertinent family history. Family Psychiatric  History: SEE SRA Social History:  History  Alcohol Use  . Yes     History  Drug Use  . Yes  . Special: "Crack" cocaine    Social History   Social History  . Marital Status: Single     Spouse Name: N/A  . Number of Children: N/A  . Years of Education: N/A   Social History Main Topics  . Smoking status: Current Every Day Smoker  . Smokeless tobacco: None  . Alcohol Use: Yes  . Drug Use: Yes    Special: "Crack" cocaine  . Sexual Activity: Not Asked   Other Topics Concern  . None   Social History Narrative   Additional Social History:    Pain Medications: denies Prescriptions: denies Over the Counter: denies History of alcohol / drug use?: Yes Longest period of sobriety (when/how long): unsure Negative Consequences of Use: Surveyor, quantity, Personal relationships, Work / Programmer, multimedia Withdrawal Symptoms: Tremors Name of Substance 1: Alcohol 1 - Age of First Use: 11 1 - Amount (size/oz): varies-has been drinking beer and wine 1 - Frequency: varies, has more than 4 drinks on one occasion weekly 1 - Duration: ongoing 1 - Last Use / Amount: yesterday "a lot"  Name of Substance 2: Crack cocaine 2 - Age of First Use: 17 2 - Amount (size/oz): varies when he can get it  2 - Frequency: varies weekly 2 - Duration: ongoing  Sleep: improved   Appetite:  Improved   Current Medications: Current Facility-Administered Medications  Medication Dose Route Frequency Provider Last Rate Last Dose  . acetaminophen (TYLENOL) tablet 650 mg  650 mg Oral Q6H PRN Worthy Flank, NP      . alum & mag hydroxide-simeth (MAALOX/MYLANTA) 200-200-20 MG/5ML suspension 30 mL  30 mL Oral Q4H PRN Worthy Flank, NP      .  feeding supplement (ENSURE ENLIVE) (ENSURE ENLIVE) liquid 237 mL  237 mL Oral BID BM Rockey Situ Jailon Schaible, MD   237 mL at 08/07/15 1016  . FLUoxetine (PROZAC) capsule 20 mg  20 mg Oral Daily Worthy Flank, NP   20 mg at 08/07/15 0347  . magnesium hydroxide (MILK OF MAGNESIA) suspension 30 mL  30 mL Oral Daily PRN Worthy Flank, NP      . multivitamin with minerals tablet 1 tablet  1 tablet Oral Daily Worthy Flank, NP   1 tablet at 08/07/15 0812  . nicotine (NICODERM CQ -  dosed in mg/24 hours) patch 21 mg  21 mg Transdermal Daily Craige Cotta, MD   21 mg at 08/07/15 4259  . thiamine (B-1) injection 100 mg  100 mg Intramuscular Once Worthy Flank, NP   100 mg at 08/04/15 0450  . thiamine (VITAMIN B-1) tablet 100 mg  100 mg Oral Daily Worthy Flank, NP   100 mg at 08/07/15 5638  . traZODone (DESYREL) tablet 100 mg  100 mg Oral QHS PRN Craige Cotta, MD   100 mg at 08/06/15 2121    Lab Results:  Results for orders placed or performed during the hospital encounter of 08/04/15 (from the past 48 hour(s))  Potassium     Status: None   Collection Time: 08/06/15  6:26 AM  Result Value Ref Range   Potassium 4.0 3.5 - 5.1 mmol/L    Comment: Performed at [facility]    Physical Findings: AIMS: Facial and Oral Movements Muscles of Facial Expression: None, normal Lips and Perioral Area: None, normal Jaw: None, normal Tongue: None, normal,Extremity Movements Upper (arms, wrists, hands, fingers): Minimal Lower (legs, knees, ankles, toes): Minimal, Trunk Movements Neck, shoulders, hips: None, normal, Overall Severity Severity of abnormal movements (highest score from questions above): None, normal Incapacitation due to abnormal movements: None, normal Patient's awareness of abnormal movements (rate only patient's report): No Awareness, Dental Status Current problems with teeth and/or dentures?: No Does patient usually wear dentures?: No  CIWA:  CIWA-Ar Total: 1 COWS:  COWS Total Score: 10  Musculoskeletal: Strength & Muscle Tone: within normal limits Gait & Station: normal Patient leans: N/A  Psychiatric Specialty Exam: Review of Systems  Constitutional: Negative.   HENT: Negative.   Respiratory: Negative.   Cardiovascular: Negative.   Gastrointestinal: Negative.   Genitourinary: Negative.   Neurological: Negative.   Endo/Heme/Allergies: Negative.   Psychiatric/Behavioral: Positive for depression and substance abuse. The  patient is nervous/anxious.   All other systems reviewed and are negative. no headache, no chest pain, no SOB  Blood pressure 120/72, pulse 89, temperature 98.3 F (36.8 C), temperature source Oral, resp. rate 18, height  (1.753 m), weight 193 lb (87.544 kg), SpO2 98 %.Body mass index is 28.49 kg/(m^2).  General Appearance: Casual  Eye Contact::  Good  Speech:  Clear and Coherent and Normal Rate  Volume:  Normal  Mood:  "OK, denies depression  Affect:   Reactive, appropriate  Thought Process:  Linear   Orientation:  Full (Time, Place, and Person)  Thought Content:   Denies hallucinations and does not appear internally preoccupied, no delusions expressed  Suicidal Thoughts:  No- denies any suicidal or self injurious ideations  Homicidal Thoughts:  No- denies any homicidal or violent ideations  Memory:  Recent and remote grossly intact   Judgement:   improving  Insight:  Fair  Psychomotor Activity:  Normal  Concentration:  Good  Recall:  Dudley MajorGood  Fund of Knowledge:Good  Language: Good  Akathisia:  No  Handed:  Right  AIMS (if indicated):     Assets:  Communication Skills Desire for Improvement Financial Resources/Insurance Social Support  ADL's:  Intact  Cognition: WNL  Sleep:  Number of Hours: 6.75   Assessment - patient improving compared to admission- at this time minimizing depression, sadness, and denying any current psychotic symptoms. Tolerating medications well, and not currently presenting with any alcohol WDL symptoms. Looking forward to discharge soon, but ambivalent about whether to go to a Rehab or to return to the community with the hope he will get a job offer soon .     Treatment Plan Summary: Daily contact with patient to assess and evaluate symptoms and progress in treatment and Medication management Encourage ongoing group, milieu participation, to work on coping skills and symptom reduction. Continue Prozac 20 mg for depression and anxiety Continue   Trazodone 100 mg for insomnia as needed  Continue  CIWA/ Librium Protocol to minimize risk of alcohol WDL. Encourage efforts to work on early recovery and relapse prevention   Treatment team working on disposition options Nyjah Denio, Madaline GuthrieFERNANDO  MD  08/07/2015, 10:40 AM

## 2015-08-07 NOTE — Progress Notes (Signed)
D: Pt presents anxious on approach. Pt denies suicidal thoughts this morning. Pt denies depression. Pt denies AVH. Pt verbalized that he's seeking long-term tx. Pt reports fair sleep and appetite. Pt compliant with taking meds and attending groups. No adverse reaction to meds verbalized by pt. A: Medications administered as ordered per MD. Verbal support given. Pt encouraged to attend groups and participate. 15 minute checks performed for safety.  R: Pt receptive to tx.

## 2015-08-07 NOTE — Progress Notes (Signed)
  D: Pt was laying in bed, but sat up to speak with the Clinical research associatewriter.  Stated, "I think I'm getting released tomorrow". Pt states he plans to find a group setting to discuss his issues. Stated, "You know something different than what got me in here". Pt has no questions or concerns.   A:  Support and encouragement was offered. 15 min checks continued for safety.  R: Pt remains safe.

## 2015-08-07 NOTE — [facility] Group Notes (Signed)
[facility] Group Notes:  (Nursing/MHT/Case Management/Adjunct)  Date:  08/07/2015  Time:  0845  Type of Therapy:  Nurse Education  Participation Level:  Did Not Attend  Participation Quality:    Affect:    Cognitive:    Insight:    Engagement in Group:    Modes of Intervention:    Summary of Progress/Problems:  Layla BarterWhite, Marlisa Caridi L 08/07/2015, 10:54 AM

## 2015-08-07 NOTE — Progress Notes (Signed)
Adult Psychoeducational Group Note  Date:  08/07/2015 Time:  9:26 PM  Group Topic/Focus:  Wrap-Up Group:   The focus of this group is to help patients review their daily goal of treatment and discuss progress on daily workbooks.  Participation Level:  Active  Participation Quality:  Appropriate  Affect:  Appropriate  Cognitive:  Alert  Insight: Appropriate  Engagement in Group:  Engaged  Modes of Intervention:  Discussion  Additional Comments:  Patient stated having an "okay day". Patient stated his goal for today was to find a place of treatment.   Maybelle Depaoli L Aubert Choyce 08/07/2015, 9:26 PM

## 2015-08-08 MED ORDER — FLUOXETINE HCL 20 MG PO CAPS: 20.0000 mg | ORAL_CAPSULE | Freq: Every day | ORAL | Status: DC

## 2015-08-08 MED ORDER — TRAZODONE HCL 100 MG PO TABS: 100.0000 mg | ORAL_TABLET | Freq: Every evening | ORAL | Status: DC | PRN

## 2015-08-08 MED ORDER — NICOTINE 21 MG/24HR TD PT24: 21.0000 mg | MEDICATED_PATCH | Freq: Every day | TRANSDERMAL | Status: DC

## 2015-08-08 NOTE — [facility] Suicide Risk Assessment (Addendum)
[facility] Discharge Suicide Risk Assessment   Demographic Factors:  34 year old male , lives with GF, has two children who live with their mothers   Total Time spent with patient: 30 minutes  Musculoskeletal: Strength & Muscle Tone: within normal limits Gait & Station: normal Patient leans: N/A  Psychiatric Specialty Exam: Physical Exam  ROS  Blood pressure 115/78, pulse 88, temperature 97.5 F (36.4 C), temperature source Oral, resp. rate 16, height  (1.753 m), weight 193 lb (87.544 kg), SpO2 98 %.Body mass index is 28.49 kg/(m^2).  General Appearance: improved grooming  Eye Contact::  Good  Speech:  Normal Rate409  Volume:  Normal  Mood:  denies depression, reports mood as " OK" today  Affect:  Appropriate  Thought Process:  Linear  Orientation:  Other:  fully alert and attentive  Thought Content:   Denies hallucinations, no delusions   Suicidal Thoughts:  No  Homicidal Thoughts:  No  Memory:  recent and remote grossly intact   Judgement:  Fair  Insight:  improving  Psychomotor Activity:  Normal- no symptoms of WDL- no tremors, no agitation or restlessness   Concentration:  Good  Recall:  Good  Fund of Knowledge:Good  Language: Good  Akathisia:  Negative  Handed:  Right  AIMS (if indicated):     Assets:  Communication Skills Desire for Improvement Resilience  Sleep:  Number of Hours: 6.25  Cognition: WNL  ADL's:  Intact   Have you used any form of tobacco in the last 30 days? (Cigarettes, Smokeless Tobacco, Cigars, and/or Pipes): Yes  Has this patient used any form of tobacco in the last 30 days? (Cigarettes, Smokeless Tobacco, Cigars, and/or Pipes)  Patient states he plans to continue Nicoderm Patches after discharge in order to continue working on smoking cessation  Mental Status Per Nursing Assessment::   On Admission:  Suicidal ideation indicated by patient  Current Mental Status by Physician: At this time patient is alert, attentive, calm, cooperative,  describes mood as " OK", denies depression, mood presents euthymic and less anxious today, no thought disorder, no SI or HI, no psychotic symptoms, future oriented   Loss Factors: Relationship stressors   Historical Factors: Alcohol, cocaine abuse , history of depression, no history of suicide attempts   Risk Reduction Factors:   Sense of responsibility to family, Living with another person, especially a relative and Positive coping skills or problem solving skills  Continued Clinical Symptoms:  As noted currently improved compared to admission- denies depression, affect appears brighter, no SI or HI, future oriented Of note, denies any cravings for alcohol or substances at this time. Cognitive Features That Contribute To Risk:  No gross cognitive deficits noted upon discharge. Is alert , attentive, and oriented x 3   Suicide Risk:  Mild:  Suicidal ideation of limited frequency, intensity, duration, and specificity.  There are no identifiable plans, no associated intent, mild dysphoria and related symptoms, good self-control (both objective and subjective assessment), few other risk factors, and identifiable protective factors, including available and accessible social support.  Principal Problem: Major depressive disorder, single episode, severe with psychotic features [facility]) Discharge Diagnoses:  Patient Active Problem List   Diagnosis Date Noted  . Major depressive disorder, single episode, severe with psychotic features (HCC) [F32.3] 08/04/2015    Follow-up Information    Follow up with [facility] On 08/13/2015.   Why:  at 8:00am for your intake screening. Please bring your ID with you to this assessment along with clothes to  stay in the event that you are admitted for treatment.   Contact information:   [address] [place], [place] [address] Phone: [phone]      Plan Of Care/Follow-up recommendations:  Activity:  as tolerated  Diet:  Regular Tests:   NA Other:  see below  Is patient on multiple antipsychotic therapies at discharge:  No   Has Patient had three or more failed trials of antipsychotic monotherapy by history:  No  Recommended Plan for Multiple Antipsychotic Therapies: NA  Patient requesting discharge and there are no current grounds for any involuntary commitment  States he plans to return home, and follow up with outpatient care, but does state he will consider going to a  residential Rehab setting in the future, if needed. Encouraged to work on ongoing abstinence, relapse prevention efforts, attend 12 step programs. [facility] .   Firman Petrow 08/08/2015, 9:59 AM

## 2015-08-08 NOTE — Tx Team (Signed)
Interdisciplinary Treatment Plan Update (Adult) Date: 08/08/2015   Date: 08/08/2015 10:44 AM  Progress in Treatment:  Attending groups: Yes, intermittently  Participating in groups: Yes  Taking medication as prescribed: Yes  Tolerating medication: Yes  Family/Significant othe contact made: No, Pt declines Patient understands diagnosis: Yes  Discussing patient identified problems/goals with staff: Yes  Medical problems stabilized or resolved: Yes  Denies suicidal/homicidal ideation: Yes Patient has not harmed self or Others: Yes   New problem(s) identified: None identified at this time.   Discharge Plan or Barriers: Pt requesting referral to [facility]. He can also discharge home with outpatient follow-up  Additional comments:  Patient and CSW reviewed pt's identified goals and treatment plan. Patient verbalized understanding and agreed to treatment plan. CSW reviewed [facility] "Discharge Process and Patient Involvement" Form. Pt verbalized understanding of information provided and signed form.   Reason for Continuation of Hospitalization:  Depression Medication stabilization Suicidal ideation Withdrawal symptoms Psychotic  Estimated length of stay: 1-3 days  Review of initial/current patient goals per problem list:   1.  Goal(s): Patient will participate in aftercare plan  Met:  Yes  Target date: 3-5 days from date of admission   As evidenced by: Patient will participate within aftercare plan AEB aftercare provider and housing plan at discharge being identified.   08/06/15: Pt requesting referral to [facility]; CSW to make referral  08/08/15: Pt discharging home with [facility] screening appointment; also given referral to [facility]  2.  Goal (s): Patient will exhibit decreased depressive symptoms and suicidal ideations.  Met:  Yes  Target date: 3-5 days from date of admission   As evidenced by: Patient will utilize self rating of depression at 3 or below and  demonstrate decreased signs of depression or be deemed stable for discharge by MD.  08/06/15: Pt rates depression at 0/10 to CSW; denies SI  4.  Goal(s): Patient will demonstrate decreased signs of withdrawal due to substance abuse  Met:  Yes  Target date: 3-5 days from date of admission   As evidenced by: Patient will produce a CIWA/COWS score of 0, have stable vitals signs, and no symptoms of withdrawal  08/06/15: Pt denies symptoms of withdrawal and is not observed to be exhibiting these symptoms 5.  Goal(s): Patient will demonstrate decreased signs of psychosis  . Met:  Yes . Target date: 3-5 days from date of admission  . As evidenced by: Patient will demonstrate decreased frequency of AVH or return to baseline function    08/06/15: Pt denies AVH and is not observed to be responding to internal stimuli.  Attendees:  Patient:    Family:    Physician: Dr. Parke Poisson, MD  08/08/2015 10:44 AM  Nursing: Lars Pinks, RN Case manager  08/08/2015 10:44 AM  Clinical Social Worker Peri Maris, [place] 08/08/2015 10:44 AM  Other:  08/08/2015 10:44 AM  Clinical: Darrol Angel, RN 08/08/2015 10:44 AM  Other: , RN Charge Nurse 08/08/2015 10:44 AM  Other:     Peri Maris, [place] Social Work [phone]

## 2015-08-08 NOTE — Progress Notes (Signed)
  D: Pt informed the writer that he'd had a good day. Stated, "We had a good speaker with some some good information".  States he plans to attend groups on outside. Pt has no questions or concerns.    A:  Support and encouragement was offered. 15 min checks continued for safety.  R: Pt remains safe.

## 2015-08-08 NOTE — Progress Notes (Signed)
Discharge note: Pt received both written and verbal discharge instructions. Pt verbalized understanding of discharge instructions. Pt agreed to f/u appt and med regimen. Pt received sample meds, prescriptions, bus pass and belongings. [facility]

## 2015-08-08 NOTE — Progress Notes (Signed)
  [facility] Adult Case Management Discharge Plan :  Will you be returning to the same living situation after discharge:  Yes,  Pt returning home At discharge, do you have transportation home?: Yes,  Pt provided with bus pass [facility]: Yes,  Pt provided with prescriptions and 7-day supply  Release of information consent forms completed and in the chart;  Patient's signature needed at discharge.  Patient to Follow up at: Follow-up Information    [facility] with [facility] On 08/13/2015.   Why:  at 8:00am for your intake screening. Please bring your ID with you to this assessment along with clothes to stay in the event that you are admitted for treatment. If you decide not to go, please call number below to inform staff.   Contact information:   [address] [place], [place] [address] Phone: [phone]      [facility].   Specialty:  Behavioral Health   Why:  Please walk-in between 8am-3pm Monday-Friday to be established with a psychiatrist and therapist.   Contact information:   [address] [place] [address] [phone]       Next level of care provider has access to [facility] Link:no  Patient denies SI/HI: Yes,  Pt denies    Safety Planning and Suicide Prevention discussed: Yes,  with Pt; declines family contact  Have you used any form of tobacco in the last 30 days? (Cigarettes, Smokeless Tobacco, Cigars, and/or Pipes): Yes  Has patient been referred to the Quitline?: Patient refused referral  Elaina HoopsCarter, Barnaby Rippeon M 08/08/2015, 10:45 AM

## 2015-08-08 NOTE — Discharge Summary (Signed)
Physician Discharge Summary Note  Patient:  Brian Nolan is an 34 y.o., male MRN:  [id number] DOB:  [birth year] Patient phone:  [phone] (home)  Patient address:   [address] [place] [address],  Total Time spent with patient: 45 minutes  Date of Admission:  08/04/2015 Date of Discharge: 08/08/2015  Reason for Admission:    History of Present Illness: Patient with a history of polysubstance abuse, previous suicidal ideation and previous self-mutilation (cutting) presents with complaint of recurrent suicidal ideations. He states he feels he has worsened to the point where he is hearing a voice telling him "to go ahead and do it" with regard to killing himself. He feels he is overwhelmed by life circumstances and cannot cope. He states he used cocaine and drank alcohol today to kill the pain.  On Evaluation:Brian Nolan is awake, alert and oriented X4 , found interacting with peers. Denies suicidal or homicidal ideation at this time. Reports a recent event to of cutting to left arm/ wrist for "attention". Denies auditory or visual hallucination and does not appear to be responding to internal stimuli. Patient interacts well with staff and others. Patient reports he is medication compliant without mediation side effects. Report learning new coping skills and is thankful to see others in similar situations.. States his depression 8/10. Report a 20 year history of cocaine addition and is requesting help to stop. Reports good appetite other wise and resting well. Patient report she is excited regarding discharge. Support, encouragement and reassurance was provided.   Principal Problem: Major depressive disorder, single episode, severe with psychotic features [facility]) Discharge Diagnoses: Patient Active Problem List   Diagnosis Date Noted  . Major depressive disorder, single episode, severe with psychotic features (HCC) [F32.3] 08/04/2015    Past Psychiatric History: See H&P  Past  Medical History:  Past Medical History  Diagnosis Date  . Substance abuse   . Depression    History reviewed. No pertinent past surgical history. Family History: History reviewed. No pertinent family history. Family Psychiatric  History: See H&P Social History:  History  Alcohol Use  . Yes     History  Drug Use  . Yes  . Special: "Crack" cocaine    Social History   Social History  . Marital Status: Single    Spouse Name: N/A  . Number of Children: N/A  . Years of Education: N/A   Social History Main Topics  . Smoking status: Current Every Day Smoker  . Smokeless tobacco: None  . Alcohol Use: Yes  . Drug Use: Yes    Special: "Crack" cocaine  . Sexual Activity: Not Asked   Other Topics Concern  . None   Social History Narrative    Hospital Course:   Junaid Wurzer was admitted for Major depressive disorder, single episode, severe with psychotic features (HCC) , with psychosis and crisis management.  Pt was treated discharged with the medications listed below under Medication List.  Medical problems were identified and treated as needed.  Home medications were restarted as appropriate.  Improvement was monitored by observation and Su Grand 's daily report of symptom reduction.  Emotional and mental status was monitored by daily self-inventory reports completed by Su Grand and clinical staff.         Su Grand was evaluated by the treatment team for stability and plans for continued recovery upon discharge. Su Grand 's motivation was an integral factor for scheduling further treatment. Employment, transportation, bed availability, health status, family support, and  any pending legal issues were also considered during hospital stay. Pt was offered further treatment options upon discharge including but not limited to Residential, Intensive Outpatient, and Outpatient treatment.  Su GrandJohn Gorczyca will follow up with the services as listed below under Follow Up  Information.     Upon completion of this admission the patient was both mentally and medically stable for discharge denying suicidal/homicidal ideation, auditory/visual/tactile hallucinations, delusional thoughts and paranoia.    Family session went well. No seclusion or restraint.  Su GrandJohn Croucher responded well to treatment with Prozac, Trazodone, and withdrawal protocol without adverse effects. Pt demonstrated improvement without reported or observed adverse effects to the point of stability appropriate for outpatient management. Pertinent labs include: UDS + for cocaine, BAL 22, K+ 3.3, treated and increased to 4.0, WBC 11.6 (asymptomatic, no sx infection). Reviewed CBC, CMP, BAL, and UDS; all unremarkable aside from noted exceptions.   Physical Findings: AIMS: Facial and Oral Movements Muscles of Facial Expression: None, normal Lips and Perioral Area: None, normal Jaw: None, normal Tongue: None, normal,Extremity Movements Upper (arms, wrists, hands, fingers): Minimal Lower (legs, knees, ankles, toes): Minimal, Trunk Movements Neck, shoulders, hips: None, normal, Overall Severity Severity of abnormal movements (highest score from questions above): None, normal Incapacitation due to abnormal movements: None, normal Patient's awareness of abnormal movements (rate only patient's report): No Awareness, Dental Status Current problems with teeth and/or dentures?: No Does patient usually wear dentures?: No  CIWA:  CIWA-Ar Total: 1 COWS:  COWS Total Score: 10  Musculoskeletal: Strength & Muscle Tone: within normal limits Gait & Station: normal Patient leans: N/A  Psychiatric Specialty Exam: Review of Systems  Psychiatric/Behavioral: Positive for depression and substance abuse. Negative for suicidal ideas and hallucinations. The patient is nervous/anxious and has insomnia.   All other systems reviewed and are negative.   Blood pressure 115/78, pulse 88, temperature 97.5 F (36.4 C),  temperature source Oral, resp. rate 16, height 5\' 9"  (1.753 m), weight 87.544 kg (193 lb), SpO2 98 %.Body mass index is 28.49 kg/(m^2).  SEE MD PSE within the SRA   Have you used any form of tobacco in the last 30 days? (Cigarettes, Smokeless Tobacco, Cigars, and/or Pipes): Yes  Has this patient used any form of tobacco in the last 30 days? (Cigarettes, Smokeless Tobacco, Cigars, and/or Pipes) Yes, Yes, A prescription for an FDA-approved tobacco cessation medication was offered at discharge and the patient accepted.  Metabolic Disorder Labs:  No results found for: HGBA1C, MPG No results found for: PROLACTIN No results found for: CHOL, TRIG, HDL, CHOLHDL, VLDL, LDLCALC  See Psychiatric Specialty Exam and Suicide Risk Assessment completed by Attending Physician prior to discharge.  Discharge destination:  Home  Is patient on multiple antipsychotic therapies at discharge:  No   Has Patient had three or more failed trials of antipsychotic monotherapy by history:  No  Recommended Plan for Multiple Antipsychotic Therapies: NA     Medication List    STOP taking these medications        acetaminophen 325 MG tablet  Commonly known as:  TYLENOL     ibuprofen 200 MG tablet  Commonly known as:  ADVIL,MOTRIN      TAKE these medications      Indication   FLUoxetine 20 MG capsule  Commonly known as:  PROZAC  Take 1 capsule (20 mg total) by mouth daily.   Indication:  depression     nicotine 21 mg/24hr patch  Commonly known as:  NICODERM CQ - dosed in  mg/24 hours  Place 1 patch (21 mg total) onto the skin daily.   Indication:  Nicotine Addiction     traZODone 100 MG tablet  Commonly known as:  DESYREL  Take 1 tablet (100 mg total) by mouth at bedtime as needed for sleep.   Indication:  Trouble Sleeping           Follow-up Information    Follow up with [facility] On 08/13/2015.   Why:  at 8:00am for your intake screening. Please bring your ID with you to this  assessment along with clothes to stay in the event that you are admitted for treatment.   Contact information:   [address] [place], [place] [address] Phone: [phone]      Follow-up recommendations:  Activity:  As tolerated Diet:  heart healthy with low sodium.  Comments:   Take all medications as prescribed. Keep all follow-up appointments as scheduled.  Do not consume alcohol or use illegal drugs while on prescription medications. Report any adverse effects from your medications to your primary care provider promptly.  In the event of recurrent symptoms or worsening symptoms, call 911, a crisis hotline, or go to the nearest emergency department for evaluation.   Signed: Nilo, Fallin, FNP-BC 08/08/2015, 10:32 AM  Patient seen, Suicide Assessment Completed.  Disposition Plan Reviewed

## 2015-10-10 ENCOUNTER — Encounter (HOSPITAL_COMMUNITY): Payer: Self-pay

## 2015-10-10 ENCOUNTER — Encounter (HOSPITAL_COMMUNITY): Payer: Self-pay | Admitting: *Deleted

## 2015-10-10 ENCOUNTER — Emergency Department (HOSPITAL_COMMUNITY)
Admission: EM | Admit: 2015-10-10 | Discharge: 2015-10-10 | Disposition: A | Discharge status: Other Acute Inpatient Hospital | Payer: Self-pay | Attending: Emergency Medicine | Admitting: Emergency Medicine

## 2015-10-10 ENCOUNTER — Inpatient Hospital Stay (HOSPITAL_COMMUNITY)
Admission: AD | Admit: 2015-10-10 | Discharge: 2015-10-12 | DRG: 885 | Disposition: A | Discharge status: Home / Self Care | Payer: Federal, State, Local not specified - Other | Source: Intra-hospital | Attending: Psychiatry | Admitting: Psychiatry

## 2015-10-10 DIAGNOSIS — F1410 Cocaine abuse, uncomplicated: Secondary | ICD-10-CM | POA: Insufficient documentation

## 2015-10-10 DIAGNOSIS — Z79899 Other long term (current) drug therapy: Secondary | ICD-10-CM | POA: Insufficient documentation

## 2015-10-10 DIAGNOSIS — F1920 Other psychoactive substance dependence, uncomplicated: Secondary | ICD-10-CM | POA: Diagnosis present

## 2015-10-10 DIAGNOSIS — F332 Major depressive disorder, recurrent severe without psychotic features: Secondary | ICD-10-CM | POA: Diagnosis present

## 2015-10-10 DIAGNOSIS — F331 Major depressive disorder, recurrent, moderate: Secondary | ICD-10-CM | POA: Diagnosis not present

## 2015-10-10 DIAGNOSIS — R443 Hallucinations, unspecified: Secondary | ICD-10-CM | POA: Insufficient documentation

## 2015-10-10 DIAGNOSIS — E785 Hyperlipidemia, unspecified: Secondary | ICD-10-CM | POA: Diagnosis present

## 2015-10-10 DIAGNOSIS — F329 Major depressive disorder, single episode, unspecified: Secondary | ICD-10-CM | POA: Diagnosis present

## 2015-10-10 DIAGNOSIS — F333 Major depressive disorder, recurrent, severe with psychotic symptoms: Secondary | ICD-10-CM | POA: Diagnosis present

## 2015-10-10 DIAGNOSIS — F17200 Nicotine dependence, unspecified, uncomplicated: Secondary | ICD-10-CM | POA: Diagnosis present

## 2015-10-10 DIAGNOSIS — F323 Major depressive disorder, single episode, severe with psychotic features: Secondary | ICD-10-CM | POA: Diagnosis present

## 2015-10-10 DIAGNOSIS — R45851 Suicidal ideations: Secondary | ICD-10-CM

## 2015-10-10 DIAGNOSIS — F1210 Cannabis abuse, uncomplicated: Secondary | ICD-10-CM | POA: Diagnosis not present

## 2015-10-10 DIAGNOSIS — F419 Anxiety disorder, unspecified: Secondary | ICD-10-CM | POA: Insufficient documentation

## 2015-10-10 HISTORY — DX: Schizophrenia, unspecified: F20.9

## 2015-10-10 LAB — ETHANOL: ALCOHOL ETHYL (B): 97 mg/dL — AB (ref <5)

## 2015-10-10 LAB — COMPREHENSIVE METABOLIC PANEL
ALT: 37 U/L (ref 17–63)
ANION GAP: 13 (ref 5–15)
AST: 30 U/L (ref 15–41)
Albumin: 4 g/dL (ref 3.5–5.0)
Alkaline Phosphatase: 64 U/L (ref 38–126)
BUN: 13 mg/dL (ref 6–20)
CHLORIDE: 101 mmol/L (ref 101–111)
CO2: 24 mmol/L (ref 22–32)
CREATININE: 1.13 mg/dL (ref 0.61–1.24)
Calcium: 9.1 mg/dL (ref 8.9–10.3)
Glucose, Bld: 103 mg/dL — ABNORMAL HIGH (ref 65–99)
Potassium: 4 mmol/L (ref 3.5–5.1)
SODIUM: 138 mmol/L (ref 135–145)
Total Bilirubin: 1.3 mg/dL — ABNORMAL HIGH (ref 0.3–1.2)
Total Protein: 6.8 g/dL (ref 6.5–8.1)

## 2015-10-10 LAB — CBC
HCT: 53.2 % — ABNORMAL HIGH (ref 39.0–52.0)
HEMOGLOBIN: 18.3 g/dL — AB (ref 13.0–17.0)
MCH: 30.7 pg (ref 26.0–34.0)
MCHC: 34.4 g/dL (ref 30.0–36.0)
MCV: 89.3 fL (ref 78.0–100.0)
PLATELETS: 221 10*3/uL (ref 150–400)
RBC: 5.96 MIL/uL — AB (ref 4.22–5.81)
RDW: 13.8 % (ref 11.5–15.5)
WBC: 11.8 10*3/uL — ABNORMAL HIGH (ref 4.0–10.5)

## 2015-10-10 LAB — RAPID URINE DRUG SCREEN, HOSP PERFORMED
Amphetamines: NOT DETECTED
BARBITURATES: NOT DETECTED
Benzodiazepines: NOT DETECTED
COCAINE: POSITIVE — AB
OPIATES: NOT DETECTED
TETRAHYDROCANNABINOL: POSITIVE — AB

## 2015-10-10 LAB — ACETAMINOPHEN LEVEL

## 2015-10-10 LAB — SALICYLATE LEVEL

## 2015-10-10 MED ORDER — ENSURE ENLIVE PO LIQD
237.0000 mL | Freq: Two times a day (BID) | ORAL | Status: DC
  Administered 2015-10-11 (×2): 237 mL via ORAL

## 2015-10-10 MED ORDER — TRAZODONE HCL 100 MG PO TABS
100.0000 mg | ORAL_TABLET | Freq: Every day | ORAL | Status: DC
  Administered 2015-10-10 – 2015-10-11 (×2): 100 mg via ORAL
  Filled 2015-10-10 (×3): qty 1
  Filled 2015-10-10: qty 7
  Filled 2015-10-10: qty 1

## 2015-10-10 MED ORDER — LORAZEPAM 1 MG PO TABS: 2.0000 mg | ORAL_TABLET | Freq: Four times a day (QID) | ORAL | Status: DC | PRN

## 2015-10-10 MED ORDER — ACETAMINOPHEN 325 MG PO TABS: 650.0000 mg | ORAL_TABLET | ORAL | Status: DC | PRN

## 2015-10-10 MED ORDER — IBUPROFEN 600 MG PO TABS: 600.0000 mg | ORAL_TABLET | Freq: Three times a day (TID) | ORAL | Status: DC | PRN

## 2015-10-10 MED ORDER — IBUPROFEN 200 MG PO TABS: 600.0000 mg | ORAL_TABLET | Freq: Three times a day (TID) | ORAL | Status: DC | PRN

## 2015-10-10 MED ORDER — ALUM & MAG HYDROXIDE-SIMETH 200-200-20 MG/5ML PO SUSP
30.0000 mL | ORAL | Status: DC | PRN
Start: 2015-10-10 — End: 2015-10-12

## 2015-10-10 MED ORDER — HYDROXYZINE HCL 25 MG PO TABS: 25.0000 mg | ORAL_TABLET | Freq: Three times a day (TID) | ORAL | Status: DC | PRN

## 2015-10-10 MED ORDER — RISPERIDONE 0.5 MG PO TABS: 0.5000 mg | ORAL_TABLET | Freq: Every day | ORAL | Status: DC

## 2015-10-10 MED ORDER — MAGNESIUM HYDROXIDE 400 MG/5ML PO SUSP: 30.0000 mL | Freq: Every day | ORAL | Status: DC | PRN

## 2015-10-10 MED ORDER — CITALOPRAM HYDROBROMIDE 20 MG PO TABS
20.0000 mg | ORAL_TABLET | Freq: Every day | ORAL | Status: DC
  Administered 2015-10-11: 20 mg via ORAL
  Filled 2015-10-10 (×2): qty 1

## 2015-10-10 MED ORDER — RISPERIDONE 0.5 MG PO TABS
0.5000 mg | ORAL_TABLET | Freq: Every day | ORAL | Status: DC
  Administered 2015-10-10: 0.5 mg via ORAL
  Filled 2015-10-10 (×3): qty 1

## 2015-10-10 MED ORDER — HYDROXYZINE HCL 25 MG PO TABS
25.0000 mg | ORAL_TABLET | Freq: Three times a day (TID) | ORAL | Status: DC | PRN
  Filled 2015-10-10: qty 10

## 2015-10-10 MED ORDER — ACETAMINOPHEN 325 MG PO TABS
650.0000 mg | ORAL_TABLET | ORAL | Status: DC | PRN
  Administered 2015-10-12: 650 mg via ORAL
  Filled 2015-10-10: qty 2

## 2015-10-10 MED ORDER — TRAZODONE HCL 100 MG PO TABS: 100.0000 mg | ORAL_TABLET | Freq: Every day | ORAL | Status: DC

## 2015-10-10 MED ORDER — CITALOPRAM HYDROBROMIDE 20 MG PO TABS
20.0000 mg | ORAL_TABLET | Freq: Every day | ORAL | Status: DC
  Administered 2015-10-10: 20 mg via ORAL
  Filled 2015-10-10: qty 1

## 2015-10-10 NOTE — BH Assessment (Signed)
[facility] Assessment Progress Note  Per Thedore Mins, MD, this pt requires psychiatric hospitalization at this time.  Berneice Heinrich, RN, [facility] has [facility] pt to [facility] Rm [id number].  Pt has signed Voluntary Admission and Consent for Treatment, as well as Consent to Release Information to [facility], his outpatient provider, and to Mr Tiburcio Pea at the [employer].  Notification calls have been place.  Signed forms have been faxed to [facility].  Pt's nurse, Diane, has been notified, and agrees to send original paperwork along with pt via [facility], and to call report to [phone].  Doylene Canning, MA Triage Specialist [phone]

## 2015-10-10 NOTE — Consult Note (Signed)
[facility] Face-to-Face Psychiatry Consult   Reason for Consult:  Depression, Substance abuse.  Referring Physician:  EDP Patient Identification: Brian Nolan MRN:  [id number] Principal Diagnosis: Major depressive disorder, single episode, severe with psychotic features [facility]) Diagnosis:   Patient Active Problem List   Diagnosis Date Noted  . Polysubstance (excluding opioids) dependence ([place]) [F19.20] 10/10/2015    Priority: High  . Major depressive disorder, single episode, severe with psychotic features ([place]) [F32.3] 08/04/2015    Priority: High    Total Time spent with patient: 45 minutes  Subjective:   Brian Nolan is a 35 y.o. male patient admitted with Depression, Substance abuse.  HPI:  AA male, 35 years old was evaluated for auditory/visual hallucination.  Patient reports feeling suicidal  With no plans.  Patient reports previous suicide ideation and he   cut his arm couple of months ago.  He reports hearing voices telling him different things and one of it is to hurt himself.  Patient's UDS is positive for Cocaine, Marijuana and Alcohol level was 97 on arrival.. He was sweating when assessed and complained of abdominal discomfort.  He has been accepted for admission and we will be seeking placement at any facility with available bed.  Past Psychiatric History:  MDD with Psychosis, Substance  Risk to Self: Suicidal Ideation: Yes-Currently Present Suicidal Intent: Yes-Currently Present Is patient at risk for suicide?: Yes Suicidal Plan?: Yes-Currently Present Specify Current Suicidal Plan: Standing in the road Access to Means: Yes Specify Access to Suicidal Means: Traffic What has been your use of drugs/alcohol within the last 12 months?: Positive for cocaine & marijuana How many times?:  (Pt does not know how many times.) Other Self Harm Risks: Hx of cutting Triggers for Past Attempts: Hallucinations Intentional Self Injurious Behavior: Cutting Comment - Self Injurious Behavior:  Made cuts in Dec or Nov 2016 Risk to Others: Homicidal Ideation: Yes-Currently Present Thoughts of Harm to Others: Yes-Currently Present Comment - Thoughts of Harm to Others: Voices telling him to harm gf Current Homicidal Intent: No Current Homicidal Plan: No Access to Homicidal Means: No Identified Victim: no one History of harm to others?: No Assessment of Violence: None Noted Violent Behavior Description: Pt says "I'm not that type of person. Does patient have access to weapons?: No Criminal Charges Pending?: No Does patient have a court date: No Prior Inpatient Therapy: Prior Inpatient Therapy: Yes Prior Therapy Dates: Dec '16 Prior Therapy Facilty/Provider(s): [facility] Reason for Treatment: schizophrenia Prior Outpatient Therapy: Prior Outpatient Therapy: No Prior Therapy Dates: None Prior Therapy Facilty/Provider(s): None Reason for Treatment: None Does patient have an ACCT team?: No Does patient have Intensive In-House Services?  : No Does patient have [facility] services? : No Does patient have P4CC services?: No  Past Medical History:  Past Medical History  Diagnosis Date  . Substance abuse   . Depression   . Schizophrenia ([place])    History reviewed. No pertinent past surgical history. Family History: No family history on file.   Family Psychiatric  History:   Uncle suffers from Schizophrenia Social History:  History  Alcohol Use  . Yes     History  Drug Use  . Yes  . Special: "Crack" cocaine    Social History   Social History  . Marital Status: Single    Spouse Name: N/A  . Number of Children: N/A  . Years of Education: N/A   Social History Main Topics  . Smoking status: Current Every Day Smoker  . Smokeless tobacco: None  .  Alcohol Use: Yes  . Drug Use: Yes    Special: "Crack" cocaine  . Sexual Activity: Not Asked   Other Topics Concern  . None   Social History Narrative   Additional Social History:    Allergies:  No Known Allergies  Labs:   Results for orders placed or performed during the hospital encounter of 10/10/15 (from the past 48 hour(s))  Urine rapid drug screen (hosp performed) (Not at [facility])     Status: Abnormal   Collection Time: 10/10/15  4:52 AM  Result Value Ref Range   Opiates NONE DETECTED NONE DETECTED   Cocaine POSITIVE (A) NONE DETECTED   Benzodiazepines NONE DETECTED NONE DETECTED   Amphetamines NONE DETECTED NONE DETECTED   Tetrahydrocannabinol POSITIVE (A) NONE DETECTED   Barbiturates NONE DETECTED NONE DETECTED    Comment:        DRUG SCREEN FOR MEDICAL PURPOSES ONLY.  IF CONFIRMATION IS NEEDED FOR ANY PURPOSE, NOTIFY LAB WITHIN 5 DAYS.        LOWEST DETECTABLE LIMITS FOR URINE DRUG SCREEN Drug Class       Cutoff (ng/mL) Amphetamine      1000 Barbiturate      200 Benzodiazepine   765 Tricyclics       465 Opiates          300 Cocaine          300 THC              50   Comprehensive metabolic panel     Status: Abnormal   Collection Time: 10/10/15  5:07 AM  Result Value Ref Range   Sodium 138 135 - 145 mmol/L   Potassium 4.0 3.5 - 5.1 mmol/L   Chloride 101 101 - 111 mmol/L   CO2 24 22 - 32 mmol/L   Glucose, Bld 103 (H) 65 - 99 mg/dL   BUN 13 6 - 20 mg/dL   Creatinine, Ser 1.13 0.61 - 1.24 mg/dL   Calcium 9.1 8.9 - 10.3 mg/dL   Total Protein 6.8 6.5 - 8.1 g/dL   Albumin 4.0 3.5 - 5.0 g/dL   AST 30 15 - 41 U/L   ALT 37 17 - 63 U/L   Alkaline Phosphatase 64 38 - 126 U/L   Total Bilirubin 1.3 (H) 0.3 - 1.2 mg/dL   GFR calc non Af Amer >60 >60 mL/min   GFR calc Af Amer >60 >60 mL/min    Comment: (NOTE) The eGFR has been calculated using the CKD EPI equation. This calculation has not been validated in all clinical situations. eGFR's persistently <60 mL/min signify possible Chronic Kidney Disease.    Anion gap 13 5 - 15  Ethanol (ETOH)     Status: Abnormal   Collection Time: 10/10/15  5:07 AM  Result Value Ref Range   Alcohol, Ethyl (B) 97 (H) <5 mg/dL    Comment:        LOWEST  DETECTABLE LIMIT FOR SERUM ALCOHOL IS 5 mg/dL FOR MEDICAL PURPOSES ONLY   Salicylate level     Status: None   Collection Time: 10/10/15  5:07 AM  Result Value Ref Range   Salicylate Lvl <0.3 2.8 - 30.0 mg/dL  Acetaminophen level     Status: Abnormal   Collection Time: 10/10/15  5:07 AM  Result Value Ref Range   Acetaminophen (Tylenol), Serum <10 (L) 10 - 30 ug/mL    Comment:        THERAPEUTIC CONCENTRATIONS VARY SIGNIFICANTLY. A RANGE OF 10-30  ug/mL MAY BE AN EFFECTIVE CONCENTRATION FOR MANY PATIENTS. HOWEVER, SOME ARE BEST TREATED AT CONCENTRATIONS OUTSIDE THIS RANGE. ACETAMINOPHEN CONCENTRATIONS >150 ug/mL AT 4 HOURS AFTER INGESTION AND >50 ug/mL AT 12 HOURS AFTER INGESTION ARE OFTEN ASSOCIATED WITH TOXIC REACTIONS.   CBC     Status: Abnormal   Collection Time: 10/10/15  5:07 AM  Result Value Ref Range   WBC 11.8 (H) 4.0 - 10.5 K/uL   RBC 5.96 (H) 4.22 - 5.81 MIL/uL   Hemoglobin 18.3 (H) 13.0 - 17.0 g/dL   HCT 53.2 (H) 39.0 - 52.0 %   MCV 89.3 78.0 - 100.0 fL   MCH 30.7 26.0 - 34.0 pg   MCHC 34.4 30.0 - 36.0 g/dL   RDW 13.8 11.5 - 15.5 %   Platelets 221 150 - 400 K/uL    Current Facility-Administered Medications  Medication Dose Route Frequency Provider Last Rate Last Dose  . acetaminophen (TYLENOL) tablet 650 mg  650 mg Oral Q4H PRN Merryl Hacker, MD      . citalopram (CELEXA) tablet 20 mg  20 mg Oral Daily Shiri Hodapp, MD      . hydrOXYzine (ATARAX/VISTARIL) tablet 25 mg  25 mg Oral TID PRN Corena Pilgrim, MD      . ibuprofen (ADVIL,MOTRIN) tablet 600 mg  600 mg Oral Q8H PRN Merryl Hacker, MD      . LORazepam (ATIVAN) tablet 2 mg  2 mg Oral Q6H PRN Del Wiseman, MD      . risperiDONE (RISPERDAL) tablet 0.5 mg  0.5 mg Oral QHS Conlan Miceli, MD      . traZODone (DESYREL) tablet 100 mg  100 mg Oral QHS Corena Pilgrim, MD       Current Outpatient Prescriptions  Medication Sig Dispense Refill  . FLUoxetine (PROZAC) 20 MG capsule Take 1 capsule  (20 mg total) by mouth daily. 30 capsule 0  . traZODone (DESYREL) 100 MG tablet Take 1 tablet (100 mg total) by mouth at bedtime as needed for sleep. 30 tablet 0  . nicotine (NICODERM CQ - DOSED IN MG/24 HOURS) 21 mg/24hr patch Place 1 patch (21 mg total) onto the skin daily. (Patient not taking: Reported on 10/10/2015) 28 patch 0    Musculoskeletal: Strength & Muscle Tone: within normal limits Gait & Station: normal Patient leans: N/A  Psychiatric Specialty Exam: Review of Systems  Constitutional: Negative.   HENT: Negative.   Eyes: Negative.   Respiratory: Negative.   Cardiovascular: Negative.   Gastrointestinal: Negative.   Genitourinary: Negative.   Musculoskeletal: Negative.   Skin: Negative.   Neurological: Negative.   Endo/Heme/Allergies: Negative.     Blood pressure 138/93, pulse 115, temperature 98.4 F (36.9 C), temperature source Oral, resp. rate 20, SpO2 95 %.There is no weight on file to calculate BMI.  General Appearance: Casual  Eye Contact::  Minimal  Speech:  Clear and Coherent and Normal Rate  Volume:  Normal  Mood:  Anxious and Depressed  Affect:  Congruent  Thought Process:  Coherent, Goal Directed and Intact  Orientation:  Full (Time, Place, and Person)  Thought Content:  Hallucinations: Auditory Visual  Suicidal Thoughts:  Yes.  without intent/plan  Homicidal Thoughts:  No  Memory:  Immediate;   Good Recent;   Good Remote;   Good  Judgement:  Impaired  Insight:  Shallow  Psychomotor Activity:  Normal  Concentration:  Fair  Recall:  Poor  Fund of Knowledge:Poor  Language: Fair  Akathisia:  No  Handed:  Right  AIMS (if indicated):     Assets:  Desire for Improvement  ADL's:  Intact  Cognition: WNL  Sleep:      Treatment Plan Summary: Daily contact with patient to assess and evaluate symptoms and progress in treatment and Medication management  Disposition:  Accepted for admission and we will be seeking placement at any facility with  available bed. We have started patient on Risperdal 0.5 mg po twice a day for mood control, Trazodone 50 mg po at bed time for sleep and Hydroxyzine 25 mg po every TID for anxiety.  Citalopram 20 mg po daily for Depression and Ativan  2 mg po every 6 hours as needed for anxiety and agitation.  Delfin Gant, NP    PMHNP-BC 10/10/2015 11:53 AM Patient seen face-to-face for psychiatric evaluation, chart reviewed and case discussed with the physician extender and developed treatment plan. Reviewed the information documented and agree with the treatment plan. Corena Pilgrim, MD
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
## 2015-10-10 NOTE — ED Notes (Signed)
Pt discharged to [facility] transported by [facility].  A&O x3 no distress.

## 2015-10-10 NOTE — ED Notes (Signed)
Pt has black coat, shoes, t-shirt, 2 pants, and black backpack with misc. Items.  Pt has been seen and wanded by security.

## 2015-10-10 NOTE — ED Notes (Signed)
Pt states that he has been hearing voices for several years and has not had any treatment of it.

## 2015-10-10 NOTE — ED Notes (Signed)
Pt stays in his room, on the bed with the covers over his head most of the day. The took the medication ordered for him and has been cooperative. No complaints voiced.

## 2015-10-10 NOTE — ED Notes (Signed)
Pt states that he has been seen 2-3 times over the last few months for hearing voices; pt states that he was started on Prozac and has been taking it as prescribed; pt states that he is hearing voices telling him to kill himself; pt states that lately the voices are more frequent; pt states that he used to only hear them during times of stress of when something happens; pt states that he hears them all the time now and that it is worse in the morning; pt states "I am afraid to go to sleep because I don't want to have to wake up"; pt states "I have a good life this shouldn't be happening"; pt denies HI; pt states that he has no plans on hurting other people but patient did state "I was hearing voices in church about the preacher" but denies HI thoughts

## 2015-10-10 NOTE — ED Provider Notes (Signed)
CSN: [id number]     Arrival date & time 10/10/15  0418 History   First MD Initiated Contact with Patient 10/10/15 [phone]     Chief Complaint  Patient presents with  . Hallucinations  . Suicidal     (Consider location/radiation/quality/duration/timing/severity/associated sxs/prior Treatment) HPI  This is a 35 year old male with history substance abuse and schizophrenia who presents with hallucinations and suicidal ideation. Patient reports that he was placed on Prozac several months ago for the same. He states that the voices have gotten worse. He states that they are now telling him to kill himself. He states that at times it is "too much to handle" and he has cut himself before. He appears multiple voices and "sometimes I can't take it." He states that today he Reached a "breaking point." He states that he has been evaluated at [facility] but "they have not helped." Denies any recent suicidal behavior but reports suicidal ideation. Denies homicidal ideation. States "I just wanted the voices to go away."  Past Medical History  Diagnosis Date  . Substance abuse   . Depression   . Schizophrenia (HCC)    History reviewed. No pertinent past surgical history. No family history on file. Social History  Substance Use Topics  . Smoking status: Current Every Day Smoker  . Smokeless tobacco: None  . Alcohol Use: Yes    Review of Systems  Psychiatric/Behavioral: Positive for suicidal ideas and hallucinations.  All other systems reviewed and are negative.     Allergies  Review of patient's allergies indicates no known allergies.  Home Medications   Prior to Admission medications   Medication Sig Start Date End Date Taking? Authorizing Provider  FLUoxetine (PROZAC) 20 MG capsule Take 1 capsule (20 mg total) by mouth daily. 08/08/15  Yes Beau Fanny, FNP  traZODone (DESYREL) 100 MG tablet Take 1 tablet (100 mg total) by mouth at bedtime as needed for sleep. 08/08/15  Yes Beau Fanny, FNP   nicotine (NICODERM CQ - DOSED IN MG/24 HOURS) 21 mg/24hr patch Place 1 patch (21 mg total) onto the skin daily. Patient not taking: Reported on 10/10/2015 08/08/15   Everardo All Withrow, FNP   BP 138/93 mmHg  Pulse 115  Temp(Src) 98.4 F (36.9 C) (Oral)  Resp 20  SpO2 95% Physical Exam  Constitutional: He is oriented to person, place, and time. No distress.  HENT:  Head: Normocephalic and atraumatic.  Cardiovascular: Normal rate and regular rhythm.   Pulmonary/Chest: Effort normal. No respiratory distress.  Musculoskeletal: He exhibits no edema.  Neurological: He is alert and oriented to person, place, and time.  Skin: Skin is warm and dry.  Psychiatric: He has a normal mood and affect.  Anxious appearing, appears to have insight  Nursing note and vitals reviewed.   ED Course  Procedures (including critical care time) Labs Review Labs Reviewed  CBC - Abnormal; Notable for the following:    WBC 11.8 (*)    RBC 5.96 (*)    Hemoglobin 18.3 (*)    HCT 53.2 (*)    All other components within normal limits  URINE RAPID DRUG SCREEN, HOSP PERFORMED - Abnormal; Notable for the following:    Cocaine POSITIVE (*)    Tetrahydrocannabinol POSITIVE (*)    All other components within normal limits  COMPREHENSIVE METABOLIC PANEL  ETHANOL  SALICYLATE LEVEL  ACETAMINOPHEN LEVEL    Imaging Review No results found. I have personally reviewed and evaluated these images and lab results as part of my  medical decision-making.   EKG Interpretation None      MDM   Final diagnoses:  Suicidal ideation  Hallucination    Patient presents with suicidal ideation and hallucinations. Worsens been placed onset Prozac. Nontoxic. No physical complaints. Lab work obtained and TTS consulted.    Shon Baton, MD 10/10/15 [phone]

## 2015-10-10 NOTE — Progress Notes (Signed)
Brian Nolan is a 35 year old male [facility] to [facility] from [facility].  He was found by the police in the street.  He had been off his medication for a few days and his voices started returning.  The voices were telling him to kill himself and kill others.  He stated that he had some relationship stress from his girlfriend and then not taking his Prozac caused the voices to get worse.  He reports that he is not hearing voices now and that "I am feeling much better now."  Admission paperwork reviewed and completed.  Skin assessment completed Tattoo on left forearm and shoulder, tattoo on right forearm and self-inflicted cuts on left forearm that are well healed.  Belongings searched and secured in locker #  17 (wallet, shoes, jacket, drawstring bag with folders, cell phone, belt, visa, MasterCards, ebt card, social security card, [name]).  Oriented him to the unit.  Q 15 minute checks initiated for safety.  We will monitor the progress towards his goals.

## 2015-10-10 NOTE — Progress Notes (Signed)
Adult Psychoeducational Group Note  Date:  10/10/2015 Time:  9:51 PM  Group Topic/Focus:  Wrap-Up Group:   The focus of this group is to help patients review their daily goal of treatment and discuss progress on daily workbooks.  Participation Level:  Active  Participation Quality:  Appropriate  Affect:  Appropriate  Cognitive:  Alert  Insight: Appropriate  Engagement in Group:  Engaged  Modes of Intervention:  Discussion  Additional Comments:  Patient goal for today was to not let certain things bother him. On a scale from 1-10, (1=worst, 10=best) patient rated his day as a 9.   Berdella Bacot L Lashawnna Lambrecht 10/10/2015, 9:51 PM

## 2015-10-10 NOTE — Tx Team (Signed)
Initial Interdisciplinary Treatment Plan   PATIENT STRESSORS: Financial difficulties Marital or family conflict Substance abuse   PATIENT STRENGTHS: Capable of independent living Communication skills Supportive family/friends Work skills   PROBLEM LIST: Problem List/Patient Goals Date to be addressed Date deferred Reason deferred Estimated date of resolution  Psychosis 10/10/15     Depression 10/10/15     Anxiety 10/10/15     "Learn how to handle certain issues 10/10/15     Try to get over things you can't change" 10/10/15                              DISCHARGE CRITERIA:  Motivation to continue treatment in a less acute level of care Verbal commitment to aftercare and medication compliance  PRELIMINARY DISCHARGE PLAN: Outpatient therapy  PATIENT/FAMIILY INVOLVEMENT: This treatment plan has been presented to and reviewed with the patient, Brian Nolan.  The patient and family have been given the opportunity to ask questions and make suggestions.  Norm Parcel Aubreigh Fuerte 10/10/2015, 5:59 PM

## 2015-10-11 ENCOUNTER — Encounter (HOSPITAL_COMMUNITY): Payer: Self-pay | Admitting: Psychiatry

## 2015-10-11 DIAGNOSIS — F1210 Cannabis abuse, uncomplicated: Secondary | ICD-10-CM

## 2015-10-11 DIAGNOSIS — F1410 Cocaine abuse, uncomplicated: Secondary | ICD-10-CM

## 2015-10-11 DIAGNOSIS — F331 Major depressive disorder, recurrent, moderate: Principal | ICD-10-CM

## 2015-10-11 MED ORDER — NICOTINE 21 MG/24HR TD PT24
21.0000 mg | MEDICATED_PATCH | Freq: Every day | TRANSDERMAL | Status: DC
  Administered 2015-10-11 – 2015-10-12 (×2): 21 mg via TRANSDERMAL
  Filled 2015-10-11 (×5): qty 1

## 2015-10-11 MED ORDER — LORAZEPAM 1 MG PO TABS: 1.0000 mg | ORAL_TABLET | Freq: Four times a day (QID) | ORAL | Status: DC | PRN

## 2015-10-11 MED ORDER — ARIPIPRAZOLE 5 MG PO TABS
5.0000 mg | ORAL_TABLET | Freq: Every day | ORAL | Status: DC
  Administered 2015-10-11 – 2015-10-12 (×2): 5 mg via ORAL
  Filled 2015-10-11: qty 7
  Filled 2015-10-11 (×4): qty 1

## 2015-10-11 MED ORDER — FLUOXETINE HCL 20 MG PO CAPS: 20.0000 mg | ORAL_CAPSULE | Freq: Every day | ORAL | Status: DC

## 2015-10-11 MED ORDER — FLUOXETINE HCL 20 MG PO CAPS
40.0000 mg | ORAL_CAPSULE | Freq: Every day | ORAL | Status: DC
  Administered 2015-10-12: 40 mg via ORAL
  Filled 2015-10-11: qty 14
  Filled 2015-10-11 (×2): qty 2

## 2015-10-11 NOTE — Progress Notes (Signed)
Adult Psychoeducational Group Note  Date:  10/11/2015 Time:  9:00 PM  Group Topic/Focus:  Wrap-Up Group:   The focus of this group is to help patients review their daily goal of treatment and discuss progress on daily workbooks.  Participation Level:  Active  Participation Quality:  Appropriate  Affect:  Appropriate  Cognitive:  Alert  Insight: Appropriate  Engagement in Group:  Engaged  Modes of Intervention:  Discussion  Additional Comments:  Patient stated having a good day. Patient stated something positive that happened today was "I got switched to my normal meds".  Zaleigh Bermingham L Makilah Dowda 10/11/2015, 9:00 PM

## 2015-10-11 NOTE — [facility] Group Notes (Signed)
[facility] Group Notes:  (Nursing/MHT/Case Management/Adjunct)  Date:  10/11/2015   Time:  0930 Type of Therapy:  Nurse Education  Participation Level:  Active  Participation Quality:  Appropriate and Attentive  Affect:  Appropriate  Cognitive:  Alert and Appropriate  Insight:  Appropriate and Good  Engagement in Group:  Engaged  Modes of Intervention:  Activity, Discussion, Education and Exploration  Summary of Progress/Problems: Topic was on leisure and lifestyle changes. Discussed the importance of choosing a healthy leisure activities. Group encouraged to surround themselves with positive and healthy group/support system when changing to a healthy lifestyle. Patient was receptive and contributed.    Mickie Bail 10/11/2015, 12:41 PM

## 2015-10-11 NOTE — Progress Notes (Signed)
D: Patient denies SI/HI/AVH. Patient rates hopelessness, depression, and anxiety as 0.  Patient affect is flat and his mood is depressed.  Patient brightens upon approach.  Patient did attend group. Patient visible on the milieu. No distress noted. A: Support and encouragement offered. Scheduled medications given to pt. Q 15 min checks continued for patient safety. R: Patient receptive. Patient remains safe on the unit.

## 2015-10-11 NOTE — Progress Notes (Signed)
D: Pt's affect brightens upon interaction. Pt observed interacting approprietly with his peers within the milieu. Pt denied any concerns he wished for this writer to address at this time. Pt negative for any SI/HI/AVH. Pt did not appear to be responding to any internal stimuli. Pt is aware of his medication changes. Pt is glad to be back on his meds such as Prozac. Pt continues to express interest in following-up with [facility].  A: Writer administered scheduled medications to pt, per MD orders. Continued support and availability as needed was extended to this pt. Staff continues to monitor pt with q30min checks.  R: No adverse drug reactions noted. Pt receptive to treatment. Pt remains safe at this time.

## 2015-10-11 NOTE — Tx Team (Signed)
Interdisciplinary Treatment Plan Update (Adult)  Date:  10/11/2015 Time Reviewed:  1:01 PM  Progress in Treatment: Attending groups: Yes. Participating in groups: Yes. Taking medication as prescribed:  Yes. Tolerating medication:  Yes. Family/Significant othe contact made:  No, pt refused. Patient understands diagnosis:  Yes, as evidenced by seeking help with suicidal ideation. Discussing patient identified problems/goals with staff:  Yes, see initial care plan. Medical problems stabilized or resolved:  Yes Denies suicidal/homicidal ideation: Yes. Issues/concerns per patient self-inventory: No. Other:  New problem(s) identified:   Discharge Plan or Barriers: See below   Reason for Continuation of Hospitalization: Medication stabilization  Comments: Brian Nolan is a 35 year old male [facility] to [facility] from [facility]. He was found by the police in the street. He had been off his medication for a few days and his voices started returning. The voices were telling him to kill himself and kill others. He stated that he had some relationship stress from his girlfriend and then not taking his Prozac caused the voices to get worse. He reports that he is not hearing voices now and that "I am feeling much better now."  Abilify, Prozac, Desyrel trial  Estimated length of stay: Pt will likely d/c tomorrow   New goal(s):  Review of initial/current patient goals per problem list:  1. Goal(s): Patient will participate in aftercare plan  Met:Yes    Target date: at discharge  As evidenced by: Patient will participate within aftercare plan AEB aftercare provider and housing plan at discharge being identified. 10/11/15: Pt will return home and follow-up outpt with [facility] and AA.  2. Goal (s): Patient will exhibit decreased depressive symptoms and suicidal ideations.  Met:Yes  Target date: at discharge  As evidenced by: Patient will utilize self rating of depression at 3 or below and  demonstrate decreased signs of depression or be deemed stable for discharge by MD. 10/11/15: Pt states that he is feeling much better and denies SI or depressive sx.   4. Goal(s): Patient will demonstrate decreased signs of psychosis.  Met: Yes  Target date:at discharge  As evidenced by: Patient will demonstrate decreased signs of psychosis as evidenced by a reduction in AVH, paranoia, and/or delusions.   10/11/15: Pt denies AVH and presents as appropriate in mood and affect.    Attendees: Patient:  10/11/2015 1:01 PM  Family:   10/11/2015 1:01 PM  Physician:  Dr. Ursula Alert, MD 10/11/2015 1:01 PM  Nursing:  Manuella Ghazi, RN  10/11/2015 1:01 PM  Case Manager:  Roque Lias, [place] 10/11/2015 1:01 PM  Counselor:  Matthew Saras, MSW Intern 10/11/2015 1:01 PM  Other:   10/11/2015 1:01 PM  Other:   10/11/2015 1:01 PM  Other:   10/11/2015 1:01 PM  Other:  10/11/2015 1:01 PM  Other:    Other:    Other:    Other:    Other:    Other:      Scribe for Treatment Team:   Georga Kaufmann, MSW Intern 10/11/2015 1:01 PM

## 2015-10-11 NOTE — H&P (Signed)
Psychiatric Admission Assessment Adult  Patient Identification: Brian Nolan MRN:  [id number] Date of Evaluation:  10/11/2015 Chief Complaint:  SCHIZOAFFECTIVE DISORDER COCAINE USE DISORDER CANNABIS USE DISORDER Principal Diagnosis: MDD (major depressive disorder), recurrent episode, moderate ([place]) Diagnosis:   Patient Active Problem List   Diagnosis Date Noted  . MDD (major depressive disorder), recurrent episode, moderate ([place]) [F33.1] 10/11/2015    Priority: High  . Cocaine use disorder, mild, abuse [F14.10] 10/11/2015  . Cannabis use disorder, mild, abuse [F12.10] 10/11/2015   History of Present Illness:  Brian Nolan is a 35 y.o. male patient admitted with Depression, Substance abuse.  He was evaluated for for auditory/visual hallucination.  He reported that he felt  Suicidal.  Patient has hx of suicidal ideations and cutting.  Patient's UDS was positive for Cocaine, Marijuana and Alcohol level was 97 on arrival.  Patient was treated at [facility] 08/2015 for same c/o suicidal ideations.  He states that he was on Prozac the last time and he did feel better on the meds.  He stated that he did not go to [facility] after his last discharge from here.  Today, he appeared in good spirits today.  He was calm and coherent.  He states that a couple of days ago he had an argument with his girlfriend of a year.  Patient had (2) 16 ounce beers which made her upset.  He reported making his way to his family's house, riding his scooter, was stopped by a sheriff, charged with offense because he was drinking alcohol.  This started a downward spiral wherein he felt he could never get ahead in life,.  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   He states that, "I did not hear voices, I just had thoughts that life was over.  I'm not suicidal."  Associated Signs/Symptoms: Depression Symptoms:  depressed  mood, anxiety, (Hypo) Manic Symptoms:  Labiality of Mood, Anxiety Symptoms:  Excessive Worry, Psychotic Symptoms:  NA PTSD Symptoms: NA Total Time spent with patient: 30 minutes  Past Psychiatric History: Depression  Is the patient at risk to self? No.  Has the patient been a risk to self in the past 6 months? No.  Has the patient been a risk to self within the distant past? No.  Is the patient a risk to others? No.  Has the patient been a risk to others in the past 6 months? No.  Has the patient been a risk to others within the distant past? No.   Prior Inpatient Therapy: Yes 08/2015 inpatient [facility] Prior Outpatient Therapy:    Alcohol Screening: 1. How often do you have a drink containing alcohol?: 2 to 4 times a month 2. How many drinks containing alcohol do you have on a typical day when you are drinking?: 1 or 2 3. How often do you have six or more drinks on one occasion?: Never Preliminary Score: 0 9. Have you or someone else been injured as a result of your drinking?: No 10. Has a relative or friend or a doctor or another health worker been concerned about your drinking or suggested you cut down?: No Alcohol Use Disorder Identification Test Final Score (AUDIT): 2 Brief Intervention: AUDIT score less than 7 or less-screening does not suggest unhealthy drinking-brief intervention not indicated Substance Abuse History in the last 12 months:  Yes.   Consequences of Substance Abuse: crisis management Previous  Psychotropic Medications: Yes  Psychological Evaluations: Yes  Past Medical History:  Past Medical History  Diagnosis Date  . Substance abuse   . Depression   . Schizophrenia ([place])    History reviewed. No pertinent past surgical history. Family History:  Family History  Problem Relation Age of Onset  . Schizophrenia Maternal Uncle    Family Psychiatric  History:  Denied Tobacco Screening:   Social History:  History  Alcohol Use  . Yes     History  Drug Use   . Yes  . Special: "Crack" cocaine, Marijuana    Additional Social History: Marital status: Long term relationship Long term relationship, how long?: A little over a year  What types of issues is patient dealing with in the relationship?: Pts girlfriend does not like the fact that he drinks and they argue frequently about this. Are you sexually active?: Yes What is your sexual orientation?: Heterosexual  Has your sexual activity been affected by drugs, alcohol, medication, or emotional stress?: No Does patient have children?: Yes How many children?: 2 (2 girls) How is patient's relationship with their children?: "Pretty good relationship"    Pain Medications: None Prescriptions: None-only Prozac prescribed but stopped taking Over the Counter: None History of alcohol / drug use?: Yes Longest period of sobriety (when/how long): unsure Negative Consequences of Use: Financial, Personal relationships, Work / Youth worker Withdrawal Symptoms: Other (Comment) (Denies) Name of Substance 1: Marijuana 1 - Age of First Use: Teens 1 - Amount (size/oz): varies 1 - Frequency: Denies current use 1 - Duration: Denies current use 1 - Last Use / Amount: Denies current use Name of Substance 2: Cocaine 2 - Age of First Use: unknown 2 - Amount (size/oz): Denies current use 2 - Frequency: Denies current use 2 - Duration: Denies current use 2 - Last Use / Amount: Denies current use                Allergies:  No Known Allergies Lab Results:  Results for orders placed or performed during the hospital encounter of 10/10/15 (from the past 48 hour(s))  Urine rapid drug screen (hosp performed) (Not at [facility])     Status: Abnormal   Collection Time: 10/10/15  4:52 AM  Result Value Ref Range   Opiates NONE DETECTED NONE DETECTED   Cocaine POSITIVE (A) NONE DETECTED   Benzodiazepines NONE DETECTED NONE DETECTED   Amphetamines NONE DETECTED NONE DETECTED   Tetrahydrocannabinol POSITIVE (A) NONE DETECTED    Barbiturates NONE DETECTED NONE DETECTED    Comment:        DRUG SCREEN FOR MEDICAL PURPOSES ONLY.  IF CONFIRMATION IS NEEDED FOR ANY PURPOSE, NOTIFY LAB WITHIN 5 DAYS.        LOWEST DETECTABLE LIMITS FOR URINE DRUG SCREEN Drug Class       Cutoff (ng/mL) Amphetamine      1000 Barbiturate      200 Benzodiazepine   093 Tricyclics       267 Opiates          300 Cocaine          300 THC              50   Comprehensive metabolic panel     Status: Abnormal   Collection Time: 10/10/15  5:07 AM  Result Value Ref Range   Sodium 138 135 - 145 mmol/L   Potassium 4.0 3.5 - 5.1 mmol/L   Chloride 101 101 - 111 mmol/L   CO2 24 22 -  32 mmol/L   Glucose, Bld 103 (H) 65 - 99 mg/dL   BUN 13 6 - 20 mg/dL   Creatinine, Ser 1.13 0.61 - 1.24 mg/dL   Calcium 9.1 8.9 - 10.3 mg/dL   Total Protein 6.8 6.5 - 8.1 g/dL   Albumin 4.0 3.5 - 5.0 g/dL   AST 30 15 - 41 U/L   ALT 37 17 - 63 U/L   Alkaline Phosphatase 64 38 - 126 U/L   Total Bilirubin 1.3 (H) 0.3 - 1.2 mg/dL   GFR calc non Af Amer >60 >60 mL/min   GFR calc Af Amer >60 >60 mL/min    Comment: (NOTE) The eGFR has been calculated using the CKD EPI equation. This calculation has not been validated in all clinical situations. eGFR's persistently <60 mL/min signify possible Chronic Kidney Disease.    Anion gap 13 5 - 15  Ethanol (ETOH)     Status: Abnormal   Collection Time: 10/10/15  5:07 AM  Result Value Ref Range   Alcohol, Ethyl (B) 97 (H) <5 mg/dL    Comment:        LOWEST DETECTABLE LIMIT FOR SERUM ALCOHOL IS 5 mg/dL FOR MEDICAL PURPOSES ONLY   Salicylate level     Status: None   Collection Time: 10/10/15  5:07 AM  Result Value Ref Range   Salicylate Lvl <3.6 2.8 - 30.0 mg/dL  Acetaminophen level     Status: Abnormal   Collection Time: 10/10/15  5:07 AM  Result Value Ref Range   Acetaminophen (Tylenol), Serum <10 (L) 10 - 30 ug/mL    Comment:        THERAPEUTIC CONCENTRATIONS VARY SIGNIFICANTLY. A RANGE OF 10-30 ug/mL  MAY BE AN EFFECTIVE CONCENTRATION FOR MANY PATIENTS. HOWEVER, SOME ARE BEST TREATED AT CONCENTRATIONS OUTSIDE THIS RANGE. ACETAMINOPHEN CONCENTRATIONS >150 ug/mL AT 4 HOURS AFTER INGESTION AND >50 ug/mL AT 12 HOURS AFTER INGESTION ARE OFTEN ASSOCIATED WITH TOXIC REACTIONS.   CBC     Status: Abnormal   Collection Time: 10/10/15  5:07 AM  Result Value Ref Range   WBC 11.8 (H) 4.0 - 10.5 K/uL   RBC 5.96 (H) 4.22 - 5.81 MIL/uL   Hemoglobin 18.3 (H) 13.0 - 17.0 g/dL   HCT 53.2 (H) 39.0 - 52.0 %   MCV 89.3 78.0 - 100.0 fL   MCH 30.7 26.0 - 34.0 pg   MCHC 34.4 30.0 - 36.0 g/dL   RDW 13.8 11.5 - 15.5 %   Platelets 221 150 - 644 K/uL    Metabolic Disorder Labs:  No results found for: HGBA1C, MPG No results found for: PROLACTIN No results found for: CHOL, TRIG, HDL, CHOLHDL, VLDL, LDLCALC  Current Medications: Current Facility-Administered Medications  Medication Dose Route Frequency Provider Last Rate Last Dose  . acetaminophen (TYLENOL) tablet 650 mg  650 mg Oral Q4H PRN Delfin Gant, NP      . alum & mag hydroxide-simeth (MAALOX/MYLANTA) 200-200-20 MG/5ML suspension 30 mL  30 mL Oral Q4H PRN Delfin Gant, NP      . ARIPiprazole (ABILIFY) tablet 5 mg  5 mg Oral Daily Kerrie Buffalo, NP   5 mg at 10/11/15 1424  . feeding supplement (ENSURE ENLIVE) (ENSURE ENLIVE) liquid 237 mL  237 mL Oral BID BM Saramma Eappen, MD   237 mL at 10/11/15 0835  . [START ON 10/12/2015] FLUoxetine (PROZAC) capsule 40 mg  40 mg Oral Daily Kerrie Buffalo, NP      . hydrOXYzine (ATARAX/VISTARIL) tablet 25 mg  25 mg Oral TID PRN Delfin Gant, NP      . ibuprofen (ADVIL,MOTRIN) tablet 600 mg  600 mg Oral Q8H PRN Delfin Gant, NP      . LORazepam (ATIVAN) tablet 1 mg  1 mg Oral Q6H PRN Ursula Alert, MD      . magnesium hydroxide (MILK OF MAGNESIA) suspension 30 mL  30 mL Oral Daily PRN Delfin Gant, NP      . nicotine (NICODERM CQ - dosed in mg/24 hours) patch 21 mg  21 mg  Transdermal Daily Saramma Eappen, MD   21 mg at 10/11/15 1425  . traZODone (DESYREL) tablet 100 mg  100 mg Oral QHS Delfin Gant, NP   100 mg at 10/10/15 2112   PTA Medications: Prescriptions prior to admission  Medication Sig Dispense Refill Last Dose  . nicotine (NICODERM CQ - DOSED IN MG/24 HOURS) 21 mg/24hr patch Place 1 patch (21 mg total) onto the skin daily. (Patient not taking: Reported on 10/10/2015) 28 patch 0 Not Taking at Unknown time  . traZODone (DESYREL) 100 MG tablet Take 1 tablet (100 mg total) by mouth at bedtime as needed for sleep. 30 tablet 0 Past Week at Unknown time    Musculoskeletal: Strength & Muscle Tone: within normal limits Gait & Station: normal Patient leans: N/A  Psychiatric Specialty Exam: Physical Exam  Vitals reviewed.   Review of Systems  All other systems reviewed and are negative.   Blood pressure 122/88, pulse 98, temperature 98.6 F (37 C), temperature source Oral, resp. rate 16, height 5' 8.5" (1.74 m), weight 90.719 kg (200 lb), SpO2 100 %.Body mass index is 29.96 kg/(m^2).   General Appearance: Fairly Groomed  Engineer, water:: Fair  Speech: Clear and Coherent  Volume: Normal  Mood: Depressed  Affect: Congruent  Thought Process: Coherent  Orientation: Full (Time, Place, and Person)  Thought Content: Rumination  Suicidal Thoughts: No  Homicidal Thoughts: No  Memory: Immediate; Fair Recent; Fair Remote; Fair  Judgement: Fair  Insight: Shallow  Psychomotor Activity: Normal  Concentration: Fair  Recall: [place]  Language: Fair  Akathisia: No  Handed: Right  AIMS (if indicated):    Assets: Desire for Improvement  Sleep: Number of Hours: 6.75  Cognition: WNL  ADL's: Intact       Treatment Plan Summary: Admit for crisis management and mood stabilization. Medication management to re-stabilize current mood symptoms.  Abilify 5 mg daily for mood  symptoms.  Increased Prozac to 40 mg for depression.   Group counseling sessions for coping skills Medical consults as needed. Review and reinstate any pertinent home medications for other health problems   Observation Level/Precautions:  15 minute checks  Laboratory:  per ED  Psychotherapy:  Group milieu  Medications:  As per medlist  Consultations:  As needed  Discharge Concerns:  safety  Estimated LOS:  2-7 days  Other:     I certify that inpatient services furnished can reasonably be expected to improve the patient's condition.    Janett Labella, NP [facility] 2/9/20173:00 PM

## 2015-10-11 NOTE — Progress Notes (Signed)
D: Pt presents anxious in affect and pleasant in mood. Pt denies any SI/HI/AVH. " I feel better now". Pt verbalized to Clinical research associate that he was having some conflict with his GF recently. Per pt, he spoke with his gf and they ae currently in a better place.  A: Writer administered scheduled medications to pt, per MD orders. Continued support and availability as needed was extended to this pt. Staff continues to monitor pt with q71min checks.  R: No adverse drug reactions noted. Pt receptive to treatment. Pt remains safe at this time.

## 2015-10-11 NOTE — [facility] Group Notes (Signed)
[facility] Group Notes:  (Counselor/Nursing/MHT/Case Management/Adjunct)  10/11/2015 1:15PM  Type of Therapy:  Group Therapy  Participation Level:  Active  Participation Quality:  Appropriate  Affect:  Flat  Cognitive:  Oriented  Insight:  Improving  Engagement in Group:  Limited  Engagement in Therapy:  Limited  Modes of Intervention:  Discussion, Exploration and Socialization  Summary of Progress/Problems: The topic for group was balance in life.  Pt participated in the discussion about when their life was in balance and out of balance and how this feels.  Pt discussed ways to get back in balance and short term goals they can work on to get where they want to be. Shared that he finds balance by support of others.  But sometime he finds that people are most supportive when he is pushed by them as opposed to coddled or babied.  Stated his faith is also something that helps bring balance to his life.  He is not balanced right now because of an incident that happened on Tuesday night. He stated he has identified a back up plan that he will pursue if things don't work out in the way he was anticipating.  Brian Nolan 10/11/2015 1:33 PM

## 2015-10-11 NOTE — Progress Notes (Signed)
Nutrition Brief Note  Patient identified on the Malnutrition Screening Tool (MST) Report  Pt with recent weight gain of 7 lb since December 2016.  Wt Readings from Last 15 Encounters:  10/10/15 200 lb (90.719 kg)  08/04/15 193 lb (87.544 kg)    Body mass index is 29.96 kg/(m^2). Patient meets criteria for overweight based on current BMI.   Diet Order:   Pt is also offered choice of unit snacks mid-morning and mid-afternoon.  Pt is eating as desired.    Labs and medications reviewed.   No nutrition interventions warranted at this time. If nutrition issues arise, please consult RD.   Tilda Franco, MS, RD, LDN Pager: [phone] After Hours Pager: [phone]

## 2015-10-11 NOTE — [facility] Suicide Risk Assessment (Signed)
[facility] Admission Suicide Risk Assessment   Nursing information obtained from:    Demographic factors:    Current Mental Status:    Loss Factors:    Historical Factors:    Risk Reduction Factors:     Total Time spent with patient: 30 minutes Principal Problem: MDD (major depressive disorder), recurrent episode, moderate ([facility]) Diagnosis:   Patient Active Problem List   Diagnosis Date Noted  . MDD (major depressive disorder), recurrent episode, moderate ([facility]) [F33.1] 10/11/2015  . Cocaine use disorder, mild, abuse [F14.10] 10/11/2015  . Cannabis use disorder, mild, abuse [F12.10] 10/11/2015   Subjective Data: Pt states " I had an argument with my girl friend , and also later on got pulled over by a cop who charged me with something without me doing anything- I was just driving on my scooter , I did not have a tag on it , may be that's why. I got upset about the events that happened. That is why I was depressed , suicidal. I feel better now. I do not hear voices , I hear my own thoughts - not real voices."   Continued Clinical Symptoms:  Alcohol Use Disorder Identification Test Final Score (AUDIT): 2 The "Alcohol Use Disorders Identification Test", Guidelines for Use in Primary Care, Second Edition.  World Science writer [facility]). Score between 0-7:  no or low risk or alcohol related problems. Score between 8-15:  moderate risk of alcohol related problems. Score between 16-19:  high risk of alcohol related problems. Score 20 or above:  warrants further diagnostic evaluation for alcohol dependence and treatment.   CLINICAL FACTORS:   Depression:   Comorbid alcohol abuse/dependence Impulsivity Alcohol/Substance Abuse/Dependencies Unstable or Poor Therapeutic Relationship   Musculoskeletal: Strength & Muscle Tone: within normal limits Gait & Station: normal Patient leans: N/A  Psychiatric Specialty Exam: Review of Systems  Psychiatric/Behavioral: Positive for depression and  substance abuse. The patient is nervous/anxious.   All other systems reviewed and are negative.   Blood pressure 122/88, pulse 98, temperature 98.6 F (37 C), temperature source Oral, resp. rate 16, height 5' 8.5" (1.74 m), weight 90.719 kg (200 lb), SpO2 100 %.Body mass index is 29.96 kg/(m^2).  General Appearance: Fairly Groomed  Patent attorney::  Fair  Speech:  Clear and Coherent  Volume:  Normal  Mood:  Depressed  Affect:  Congruent  Thought Process:  Coherent  Orientation:  Full (Time, Place, and Person)  Thought Content:  Rumination  Suicidal Thoughts:  No  Homicidal Thoughts:  No  Memory:  Immediate;   Fair Recent;   Fair Remote;   Fair  Judgement:  Fair  Insight:  Shallow  Psychomotor Activity:  Normal  Concentration:  Fair  Recall:  [employer] of Knowledge:Fair  Language: Fair  Akathisia:  No  Handed:  Right  AIMS (if indicated):     Assets:  Desire for Improvement  Sleep:  Number of Hours: 6.75  Cognition: WNL  ADL's:  Intact    COGNITIVE FEATURES THAT CONTRIBUTE TO RISK:  Closed-mindedness, Polarized thinking and Thought constriction (tunnel vision)    SUICIDE RISK:   Mild:  Suicidal ideation of limited frequency, intensity, duration, and specificity.  There are no identifiable plans, no associated intent, mild dysphoria and related symptoms, good self-control (both objective and subjective assessment), few other risk factors, and identifiable protective factors, including available and accessible social support.  PLAN OF CARE:Patient will benefit from inpatient treatment and stabilization.  Estimated length of stay is 5-7 days.  Reviewed  past medical records,treatment plan.  Will restart Prozac 20 mg po daily for affective sx- pt reports good response to it in the past. Pt just stopped taking it a week ago. Will consider Abilify 5 mg po daily to augment the effect of prozac , if he is agreeable. Case discussed with Maud Deed NP - please also see plan in h&p. Will  continue to monitor vitals ,medication compliance and treatment side effects while patient is here.  Will monitor for medical issues as well as call consult as needed.  Reviewed labs ,will order tsh, lipid panel,hba1c, ekg for qtc. CSW will start working on disposition.  Patient to participate in therapeutic milieu .        I certify that inpatient services furnished can reasonably be expected to improve the patient's condition.   Lewis Keats, MD 10/11/2015, 1:33 PM
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
## 2015-10-12 DIAGNOSIS — E785 Hyperlipidemia, unspecified: Secondary | ICD-10-CM | POA: Clinically undetermined

## 2015-10-12 LAB — LIPID PANEL
Cholesterol: 203 mg/dL — ABNORMAL HIGH (ref 0–200)
HDL: 60 mg/dL (ref >40)
LDL CALC: UNDETERMINED mg/dL (ref 0–99)
Total CHOL/HDL Ratio: 3.4 RATIO
Triglycerides: 444 mg/dL — ABNORMAL HIGH (ref <150)
VLDL: UNDETERMINED mg/dL (ref 0–40)

## 2015-10-12 LAB — TSH: TSH: 0.927 u[IU]/mL (ref 0.350–4.500)

## 2015-10-12 MED ORDER — ARIPIPRAZOLE 5 MG PO TABS: 5.0000 mg | ORAL_TABLET | Freq: Every day | ORAL | Status: DC

## 2015-10-12 MED ORDER — FLUOXETINE HCL 40 MG PO CAPS: 40.0000 mg | ORAL_CAPSULE | Freq: Every day | ORAL | Status: DC

## 2015-10-12 MED ORDER — HYDROXYZINE HCL 25 MG PO TABS: 25.0000 mg | ORAL_TABLET | Freq: Three times a day (TID) | ORAL | Status: DC | PRN

## 2015-10-12 MED ORDER — TRAZODONE HCL 100 MG PO TABS: 100.0000 mg | ORAL_TABLET | Freq: Every day | ORAL | Status: DC

## 2015-10-12 MED ORDER — NICOTINE 21 MG/24HR TD PT24: 21.0000 mg | MEDICATED_PATCH | Freq: Every day | TRANSDERMAL | Status: DC

## 2015-10-12 MED ORDER — GEMFIBROZIL 600 MG PO TABS: 600.0000 mg | ORAL_TABLET | Freq: Two times a day (BID) | ORAL | Status: DC

## 2015-10-12 MED ORDER — GEMFIBROZIL 600 MG PO TABS
600.0000 mg | ORAL_TABLET | Freq: Two times a day (BID) | ORAL | Status: DC
  Administered 2015-10-12: 600 mg via ORAL
  Filled 2015-10-12 (×2): qty 14
  Filled 2015-10-12 (×3): qty 1

## 2015-10-12 NOTE — Discharge Summary (Signed)
Physician Discharge Summary Note  Patient:  Brian Nolan is an 35 y.o., male MRN:  [id number] DOB:  1981/02/09 Patient phone:  [phone] (home)  Patient address:   [address] [place] [address],  Total Time spent with patient: 30 minutes  Date of Admission:  10/10/2015 Date of Discharge:  10/12/2015  Reason for Admission:  auditory/visual hallucination.  Suicidal  Principal Problem: MDD (major depressive disorder), recurrent episode, moderate (HCC) Discharge Diagnoses: Patient Active Problem List   Diagnosis Date Noted  . MDD (major depressive disorder), recurrent episode, moderate (HCC) [F33.1] 10/11/2015    Priority: High  . Hyperlipidemia [E78.5] 10/12/2015  . Cocaine use disorder, mild, abuse [F14.10] 10/11/2015  . Cannabis use disorder, mild, abuse [F12.10] 10/11/2015    Past Psychiatric History:  MDD (major depressive disorder), recurrent episode  Past Medical History:  Past Medical History  Diagnosis Date  . Substance abuse   . Depression   . Schizophrenia (HCC)    History reviewed. No pertinent past surgical history. Family History:  Family History  Problem Relation Age of Onset  . Schizophrenia Maternal Uncle    Family Psychiatric  History:  See above noted Social History:  History  Alcohol Use  . Yes     History  Drug Use  . Yes  . Special: "Crack" cocaine, Marijuana    Social History   Social History  . Marital Status: Single    Spouse Name: N/A  . Number of Children: N/A  . Years of Education: N/A   Social History Main Topics  . Smoking status: Current Every Day Smoker  . Smokeless tobacco: None  . Alcohol Use: Yes  . Drug Use: Yes    Special: "Crack" cocaine, Marijuana  . Sexual Activity: Not Asked   Other Topics Concern  . None   Social History Narrative    Hospital Course:  Brian Nolan is a 35 y.o. male patient admitted with Depression, Substance abuse. He was evaluated for for auditory/visual hallucination. He  reported that he felt Suicidal. Patient has hx of suicidal ideations and cutting. Patient's UDS was positive for Cocaine, Marijuana and Alcohol level was 97 on arrival.  Brian Nolan was admitted for MDD (major depressive disorder), recurrent episode, moderate (HCC) and crisis management.  He was treated with the following medications,  Prozac 20 mg po daily for depression,  Abilify 5 mg po daily to augment the effect of Prozac.  Brian Nolan was discharged with current medication and was instructed on how to take medications as prescribed; (details listed below under Medication List).  Medical problems were identified and treated as needed.  Home medications were restarted as appropriate.  Improvement was monitored by observation and Brian Nolan daily report of symptom reduction.  Emotional and mental status was monitored by daily self-inventory reports completed by Brian Nolan and clinical staff.  Brian Nolan improved and verbalized future plans with the help of family support available to him.         Brian Nolan was evaluated by the treatment team for stability and plans for continued recovery upon discharge.  Brian Nolan motivation was an integral factor for scheduling further treatment.  Employment, transportation, bed availability, health status, family support, and any pending legal issues were also considered during his hospital stay.  He was offered further treatment options upon discharge including but not limited to Residential, Intensive Outpatient, and Outpatient treatment.  Brian Nolan will follow up with the services as listed below under Follow Up Information.  Upon completion of this admission the Brian Nolan was both mentally and medically stable for discharge denying suicidal/homicidal ideation, auditory/visual/tactile hallucinations, delusional thoughts and paranoia.     Physical Findings: AIMS: Facial and Oral Movements Muscles of Facial Expression: None, normal Lips and  Perioral Area: None, normal Jaw: None, normal Tongue: None, normal,Extremity Movements Upper (arms, wrists, hands, fingers): None, normal Lower (legs, knees, ankles, toes): None, normal, Trunk Movements Neck, shoulders, hips: None, normal, Overall Severity Severity of abnormal movements (highest score from questions above): None, normal Incapacitation due to abnormal movements: None, normal Patient's awareness of abnormal movements (rate only patient's report): No Awareness, Dental Status Current problems with teeth and/or dentures?: No Does patient usually wear dentures?: No  CIWA:  CIWA-Ar Total: 0 COWS:     Musculoskeletal: Strength & Muscle Tone: within normal limits Gait & Station: normal Patient leans: N/A  Psychiatric Specialty Exam: Review of Systems  All other systems reviewed and are negative.   Blood pressure 130/80, pulse 91, temperature 98.4 F (36.9 C), temperature source Oral, resp. rate 18, height 5' 8.5" (1.74 m), weight 90.719 kg (200 lb), SpO2 100 %.Body mass index is 29.96 kg/(m^2).  Have you used any form of tobacco in the last 30 days? (Cigarettes, Smokeless Tobacco, Cigars, and/or Pipes): Yes  Has this patient used any form of tobacco in the last 30 days? (Cigarettes, Smokeless Tobacco, Cigars, and/or Pipes) Yes, given  Metabolic Disorder Labs:  No results found for: HGBA1C, MPG No results found for: PROLACTIN Lab Results  Component Value Date   CHOL 203* 10/12/2015   TRIG 444* 10/12/2015   HDL 60 10/12/2015   CHOLHDL 3.4 10/12/2015   VLDL UNABLE TO CALCULATE IF TRIGLYCERIDE OVER 400 mg/dL 14/78/2956   LDLCALC UNABLE TO CALCULATE IF TRIGLYCERIDE OVER 400 mg/dL 21/30/8657    See Psychiatric Specialty Exam and Suicide Risk Assessment completed by Attending Physician prior to discharge.  Discharge destination:  Home  Is patient on multiple antipsychotic therapies at discharge:  No   Has Patient had three or more failed trials of antipsychotic  monotherapy by history:  No  Recommended Plan for Multiple Antipsychotic Therapies: NA     Medication List    TAKE these medications      Indication   ARIPiprazole 5 MG tablet  Commonly known as:  ABILIFY  Take 1 tablet (5 mg total) by mouth daily.   Indication:  Major Depressive Disorder     FLUoxetine 40 MG capsule  Commonly known as:  PROZAC  Take 1 capsule (40 mg total) by mouth daily.   Indication:  Depression, Major Depressive Disorder     gemfibrozil 600 MG tablet  Commonly known as:  LOPID  Take 1 tablet (600 mg total) by mouth 2 (two) times daily before a meal.   Indication:  high cholesterol     hydrOXYzine 25 MG tablet  Commonly known as:  ATARAX/VISTARIL  Take 1 tablet (25 mg total) by mouth 3 (three) times daily as needed (agitation).   Indication:  Anxiety Neurosis     nicotine 21 mg/24hr patch  Commonly known as:  NICODERM CQ - dosed in mg/24 hours  Place 1 patch (21 mg total) onto the skin daily.   Indication:  Nicotine Addiction     traZODone 100 MG tablet  Commonly known as:  DESYREL  Take 1 tablet (100 mg total) by mouth at bedtime.   Indication:  Excessive Use of Alcohol, Trouble Sleeping       Follow-up Information  Follow up with [facility].   Specialty:  Behavioral Health   Why:  Call them to see if you can get an appointment.  If not, you will need to go to the walk-in clinic. Next Friday at 8AM would be good since you said you have the day off.   [address] [place] [address] [phone]       Follow-up recommendations:  Activity:  as tol Diet:  as tol  Comments:  1.  Take all your medications as prescribed.              2.  Report any adverse side effects to outpatient provider.                       3.  Patient instructed to not use alcohol or illegal drugs while on prescription medicines.            4.  In the event of worsening symptoms, instructed patient to call 911, the crisis hotline or go to  nearest emergency room for evaluation of symptoms.  Signed: Lindwood Qua, NP [facility] 10/12/2015, 10:45 AM

## 2015-10-12 NOTE — BHH Suicide Risk Assessment (Signed)
BHH INPATIENT:  Family/Significant Other Suicide Prevention Education  Suicide Prevention Education:  Patient Refusal for Family/Significant Other Suicide Prevention Education: The patient Brian Nolan has refused to provide written consent for family/significant other to be provided Family/Significant Other Suicide Prevention Education during admission and/or prior to discharge.  Physician notified.  Jonathon Jordan 10/12/2015, 12:43 PM

## 2015-10-12 NOTE — Progress Notes (Signed)
Brian Nolan was prepared for DC as this nurse read his daily assessment. On it , he wrote he denied SI today and he rated his depression, hopelessness  And anxiety " 0/0/0/", respectively. All paperwork is reviewed with him and he is given prescriptions, samples of meds as well as DC AVS and  DC  Plan from MD SRA. The transition record is included and then he is escorted to bldg entrance and he is dc'd home. He tolerated this well.

## 2015-10-12 NOTE — BHH Suicide Risk Assessment (Signed)
[facility] Discharge Suicide Risk Assessment   Principal Problem: MDD (major depressive disorder), recurrent episode, moderate (HCC) Discharge Diagnoses:  Patient Active Problem List   Diagnosis Date Noted  . Hyperlipidemia [E78.5] 10/12/2015  . MDD (major depressive disorder), recurrent episode, moderate (HCC) [F33.1] 10/11/2015  . Cocaine use disorder, mild, abuse [F14.10] 10/11/2015  . Cannabis use disorder, mild, abuse [F12.10] 10/11/2015    Total Time spent with patient: 30 minutes  Musculoskeletal: Strength & Muscle Tone: within normal limits Gait & Station: normal Patient leans: N/A  Psychiatric Specialty Exam: Review of Systems  Psychiatric/Behavioral: Positive for substance abuse. Negative for depression, suicidal ideas and hallucinations. The patient is not nervous/anxious and does not have insomnia.   All other systems reviewed and are negative.   Blood pressure 130/80, pulse 91, temperature 98.4 F (36.9 C), temperature source Oral, resp. rate 18, height 5' 8.5" (1.74 m), weight 90.719 kg (200 lb), SpO2 100 %.Body mass index is 29.96 kg/(m^2).  General Appearance: Casual  Eye Contact::  Fair  Speech:  Clear and Coherent409  Volume:  Normal  Mood:  Euthymic  Affect:  Appropriate  Thought Process:  Coherent  Orientation:  Full (Time, Place, and Person)  Thought Content:  WDL  Suicidal Thoughts:  No  Homicidal Thoughts:  No  Memory:  Immediate;   Fair Recent;   Fair Remote;   Fair  Judgement:  Fair  Insight:  Fair  Psychomotor Activity:  Normal  Concentration:  Fair  Recall:  [employer] of Knowledge:Fair  Language: Fair  Akathisia:  No  Handed:  Right  AIMS (if indicated):   0  Assets:  Desire for Improvement  Sleep:  Number of Hours: 6.5  Cognition: WNL  ADL's:  Intact   Mental Status Per Nursing Assessment::   On Admission:     Demographic Factors:  Male  Loss Factors: Legal issues  Historical Factors: Impulsivity  Risk Reduction Factors:    Positive social support  Continued Clinical Symptoms:  Alcohol/Substance Abuse/Dependencies Unstable or Poor Therapeutic Relationship  Cognitive Features That Contribute To Risk:  None    Suicide Risk:  Minimal: No identifiable suicidal ideation.  Patients presenting with no risk factors but with morbid ruminations; may be classified as minimal risk based on the severity of the depressive symptoms    Plan Of Care/Follow-up recommendations:  Activity:  no restrictions Diet:  regular Tests:  follow up on lipid panel - follow up with PMD Other:  NONE  Celestial Barnfield, MD 10/12/2015, 9:43 AM

## 2015-10-13 LAB — HEMOGLOBIN A1C
Hgb A1c MFr Bld: 5.4 % (ref 4.8–5.6)
Mean Plasma Glucose: 108 mg/dL

## 2015-10-15 NOTE — Progress Notes (Signed)
  [facility] Adult Case Management Discharge Plan :  Will you be returning to the same living situation after discharge:  Yes,  home At discharge, do you have transportation home?: Yes,  friend Do you have the ability to pay for your medications: Yes,  mental health  Release of information consent forms completed and in the chart;  Patient's signature needed at discharge.  Patient to Follow up at: Follow-up Information    Follow up with [facility].   Specialty:  Behavioral Health   Why:  Call them to see if you can get an appointment.  If not, you will need to go to the walk-in clinic. Next Friday at 8AM would be good since you said you have the day off.   Contact information:   [address] [place] [address] [phone]       Next level of care provider has access to [facility] Link:no  Safety Planning and Suicide Prevention discussed: Yes,  yes  Have you used any form of tobacco in the last 30 days? (Cigarettes, Smokeless Tobacco, Cigars, and/or Pipes): Yes  Has patient been referred to the Quitline?: Patient refused referral  Patient has been referred for addiction treatment: Yes  Daryel Gerald B 10/15/2015, 10:16 AM

## 2016-04-24 ENCOUNTER — Inpatient Hospital Stay
Admission: AD | Admit: 2016-04-24 | Discharge: 2016-04-28 | DRG: 885 | Disposition: A | Discharge status: Home / Self Care | Payer: No Typology Code available for payment source | Source: Intra-hospital | Attending: Psychiatry | Admitting: Psychiatry

## 2016-04-24 ENCOUNTER — Emergency Department (HOSPITAL_COMMUNITY)
Admission: EM | Admit: 2016-04-24 | Discharge: 2016-04-24 | Payer: Self-pay | Attending: Emergency Medicine | Admitting: Emergency Medicine

## 2016-04-24 ENCOUNTER — Encounter (HOSPITAL_COMMUNITY): Payer: Self-pay | Admitting: Emergency Medicine

## 2016-04-24 DIAGNOSIS — F331 Major depressive disorder, recurrent, moderate: Secondary | ICD-10-CM | POA: Diagnosis present

## 2016-04-24 DIAGNOSIS — Z79899 Other long term (current) drug therapy: Secondary | ICD-10-CM | POA: Insufficient documentation

## 2016-04-24 DIAGNOSIS — F17210 Nicotine dependence, cigarettes, uncomplicated: Secondary | ICD-10-CM | POA: Insufficient documentation

## 2016-04-24 DIAGNOSIS — F419 Anxiety disorder, unspecified: Secondary | ICD-10-CM | POA: Diagnosis present

## 2016-04-24 DIAGNOSIS — Z91410 Personal history of adult physical and sexual abuse: Secondary | ICD-10-CM | POA: Diagnosis not present

## 2016-04-24 DIAGNOSIS — F1910 Other psychoactive substance abuse, uncomplicated: Secondary | ICD-10-CM | POA: Insufficient documentation

## 2016-04-24 DIAGNOSIS — F17200 Nicotine dependence, unspecified, uncomplicated: Secondary | ICD-10-CM

## 2016-04-24 DIAGNOSIS — Z813 Family history of other psychoactive substance abuse and dependence: Secondary | ICD-10-CM | POA: Diagnosis not present

## 2016-04-24 DIAGNOSIS — G4700 Insomnia, unspecified: Secondary | ICD-10-CM | POA: Diagnosis present

## 2016-04-24 DIAGNOSIS — E785 Hyperlipidemia, unspecified: Secondary | ICD-10-CM | POA: Diagnosis present

## 2016-04-24 DIAGNOSIS — Z915 Personal history of self-harm: Secondary | ICD-10-CM

## 2016-04-24 DIAGNOSIS — R45851 Suicidal ideations: Secondary | ICD-10-CM | POA: Diagnosis present

## 2016-04-24 DIAGNOSIS — F1410 Cocaine abuse, uncomplicated: Secondary | ICD-10-CM | POA: Diagnosis present

## 2016-04-24 DIAGNOSIS — F1210 Cannabis abuse, uncomplicated: Secondary | ICD-10-CM | POA: Diagnosis present

## 2016-04-24 DIAGNOSIS — F1290 Cannabis use, unspecified, uncomplicated: Secondary | ICD-10-CM | POA: Insufficient documentation

## 2016-04-24 DIAGNOSIS — F329 Major depressive disorder, single episode, unspecified: Secondary | ICD-10-CM | POA: Diagnosis present

## 2016-04-24 DIAGNOSIS — Z818 Family history of other mental and behavioral disorders: Secondary | ICD-10-CM

## 2016-04-24 DIAGNOSIS — F1490 Cocaine use, unspecified, uncomplicated: Secondary | ICD-10-CM | POA: Insufficient documentation

## 2016-04-24 LAB — CBC
HCT: 46.3 % (ref 39.0–52.0)
Hemoglobin: 16.2 g/dL (ref 13.0–17.0)
MCH: 30.9 pg (ref 26.0–34.0)
MCHC: 35 g/dL (ref 30.0–36.0)
MCV: 88.2 fL (ref 78.0–100.0)
PLATELETS: 182 10*3/uL (ref 150–400)
RBC: 5.25 MIL/uL (ref 4.22–5.81)
RDW: 13.7 % (ref 11.5–15.5)
WBC: 9.2 10*3/uL (ref 4.0–10.5)

## 2016-04-24 LAB — COMPREHENSIVE METABOLIC PANEL
ALT: 34 U/L (ref 17–63)
AST: 44 U/L — AB (ref 15–41)
Albumin: 3.9 g/dL (ref 3.5–5.0)
Alkaline Phosphatase: 66 U/L (ref 38–126)
Anion gap: 8 (ref 5–15)
BUN: 10 mg/dL (ref 6–20)
CHLORIDE: 104 mmol/L (ref 101–111)
CO2: 25 mmol/L (ref 22–32)
Calcium: 8.8 mg/dL — ABNORMAL LOW (ref 8.9–10.3)
Creatinine, Ser: 0.91 mg/dL (ref 0.61–1.24)
GFR calc non Af Amer: >60 mL/min (ref >60)
Glucose, Bld: 98 mg/dL (ref 65–99)
POTASSIUM: 3.5 mmol/L (ref 3.5–5.1)
SODIUM: 137 mmol/L (ref 135–145)
TOTAL PROTEIN: 7 g/dL (ref 6.5–8.1)
Total Bilirubin: 0.6 mg/dL (ref 0.3–1.2)

## 2016-04-24 LAB — RAPID URINE DRUG SCREEN, HOSP PERFORMED
AMPHETAMINES: NOT DETECTED
BENZODIAZEPINES: NOT DETECTED
Barbiturates: NOT DETECTED
Cocaine: POSITIVE — AB
OPIATES: NOT DETECTED
Tetrahydrocannabinol: POSITIVE — AB

## 2016-04-24 LAB — ACETAMINOPHEN LEVEL: Acetaminophen (Tylenol), Serum: <10 ug/mL — ABNORMAL LOW (ref 10–30)

## 2016-04-24 LAB — SALICYLATE LEVEL

## 2016-04-24 LAB — ETHANOL

## 2016-04-24 MED ORDER — LORAZEPAM 0.5 MG PO TABS: 1.0000 mg | ORAL_TABLET | Freq: Three times a day (TID) | ORAL | Status: DC | PRN

## 2016-04-24 MED ORDER — IBUPROFEN 600 MG PO TABS: 600.0000 mg | ORAL_TABLET | Freq: Three times a day (TID) | ORAL | Status: DC | PRN

## 2016-04-24 MED ORDER — GEMFIBROZIL 600 MG PO TABS
600.0000 mg | ORAL_TABLET | Freq: Two times a day (BID) | ORAL | Status: DC
  Administered 2016-04-25 – 2016-04-28 (×7): 600 mg via ORAL
  Filled 2016-04-24 (×7): qty 1

## 2016-04-24 MED ORDER — FLUOXETINE HCL 20 MG PO CAPS
40.0000 mg | ORAL_CAPSULE | Freq: Every day | ORAL | Status: DC
  Administered 2016-04-24: 40 mg via ORAL
  Filled 2016-04-24: qty 2

## 2016-04-24 MED ORDER — MAGNESIUM HYDROXIDE 400 MG/5ML PO SUSP: 30.0000 mL | Freq: Every day | ORAL | Status: DC | PRN

## 2016-04-24 MED ORDER — NICOTINE 21 MG/24HR TD PT24
21.0000 mg | MEDICATED_PATCH | Freq: Every day | TRANSDERMAL | Status: DC
  Administered 2016-04-25 – 2016-04-28 (×3): 21 mg via TRANSDERMAL
  Filled 2016-04-24 (×4): qty 1

## 2016-04-24 MED ORDER — IBUPROFEN 200 MG PO TABS: 600.0000 mg | ORAL_TABLET | Freq: Three times a day (TID) | ORAL | Status: DC | PRN

## 2016-04-24 MED ORDER — ALUM & MAG HYDROXIDE-SIMETH 200-200-20 MG/5ML PO SUSP: 30.0000 mL | ORAL | Status: DC | PRN

## 2016-04-24 MED ORDER — LORAZEPAM 1 MG PO TABS: 1.0000 mg | ORAL_TABLET | Freq: Three times a day (TID) | ORAL | Status: DC | PRN

## 2016-04-24 MED ORDER — TRAZODONE HCL 100 MG PO TABS
100.0000 mg | ORAL_TABLET | Freq: Every day | ORAL | Status: DC
  Administered 2016-04-24: 100 mg via ORAL
  Filled 2016-04-24: qty 1

## 2016-04-24 MED ORDER — ACETAMINOPHEN 325 MG PO TABS: 650.0000 mg | ORAL_TABLET | Freq: Four times a day (QID) | ORAL | Status: DC | PRN

## 2016-04-24 MED ORDER — FLUOXETINE HCL 20 MG PO CAPS
40.0000 mg | ORAL_CAPSULE | Freq: Every day | ORAL | Status: DC
  Administered 2016-04-25: 40 mg via ORAL
  Filled 2016-04-24: qty 2

## 2016-04-24 MED ORDER — HYDROXYZINE HCL 25 MG PO TABS: 25.0000 mg | ORAL_TABLET | Freq: Three times a day (TID) | ORAL | Status: DC | PRN

## 2016-04-24 MED ORDER — TRAZODONE HCL 100 MG PO TABS: 100.0000 mg | ORAL_TABLET | Freq: Every day | ORAL | Status: DC

## 2016-04-24 NOTE — Tx Team (Signed)
Initial Treatment Plan 04/24/2016 10:59 PM Brian GrandJohn Nolan [id number]    PATIENT STRESSORS: Loss of uncle Marital or family conflict Medication change or noncompliance Substance abuse   PATIENT STRENGTHS: Ability for insight General fund of knowledge Physical Health   PATIENT IDENTIFIED PROBLEMS: Depression  Substance abuse "Once I sober up the problems are still there"    "A way to get control of things."               DISCHARGE CRITERIA:  Adequate post-discharge living arrangements Improved stabilization in mood, thinking, and/or behavior Motivation to continue treatment in a less acute level of care Need for constant or close observation no longer present Reduction of life-threatening or endangering symptoms to within safe limits Verbal commitment to aftercare and medication compliance  PRELIMINARY DISCHARGE PLAN: Attend aftercare/continuing care group Attend 12-step recovery group Outpatient therapy  PATIENT/FAMILY INVOLVEMENT: This treatment plan has been presented to and reviewed with the patient, Brian Nolan, and/or family member.  The patient and family have been given the opportunity to ask questions and make suggestions.  Beckie BusingMichelle L Parsells, RN 04/24/2016, 10:59 PM

## 2016-04-24 NOTE — ED Notes (Signed)
Bed: [facility]PT4 Expected date:  Expected time:  Means of arrival:  Comments: 

## 2016-04-24 NOTE — Progress Notes (Signed)
Entered in d/c instructions Please use the resources provided to you in emergency room by case manager to assist you're your choice of doctor for follow up  These [place] uninsured resources provide possible primary care providers, resources for discounted medications, housing, dental resources, affordable care act information, plus other resources for [place]   

## 2016-04-24 NOTE — Progress Notes (Signed)
Patient has been accepted to [facility]. Charge nurse is CylinderPhyllis. Patient assigned to room 314 Accepting physician is Dr. Ardyth HarpsHernandez. Attending is Dr. Ardyth HarpsHernandez Call report to [phone].  Representative was Marny LowensteinUrsula from registration/ admissions. Brian Nolan, LCAS-A, LPC-A, [facility]  Counselor 04/24/2016 1:59 PM

## 2016-04-24 NOTE — Progress Notes (Signed)
04/24/16 1353:  LRT went to pt room to introduce self and offer activities.  Pt seemed a little nervous but pleasant.  Pt was lying down and asked for a few minutes.  LRT informed pt she would be back to check on him.  Pt was receptive to that.  LRT went back and checked on pt at 1439 and once again offered activities to pt.  Pt stated he was fine and was not interested in activities at this time.   Caroll RancherMarjette Tahni Porchia, LRT/CTRS

## 2016-04-24 NOTE — BH Assessment (Addendum)
BHH Assessment Progress Note  Per Nanine MeansJamison Lord, DNP, this pt requires psychiatric hospitalization at this time.  Lillia AbedLindsay, RN, [facility], reports that pt has been accepted to [facility] by Dr Ardyth HarpsHernandez to Rm 314.  They request that pt be transferred by 17:00.  EDP Crista Curbana Liu, MD, concurs with this plan.  Pt has signed Voluntary Admission and Consent for Treatment, as well as Consent to Release Information to no one, and signed forms have been faxed to [phone].  Pt's nurse, Wille CelesteJanie, has been notified. She agrees to send original forms with pt via [facility] and to call report to [phone].  Doylene Canninghomas Khamia Stambaugh, MA Triage Specialist [phone]

## 2016-04-24 NOTE — ED Triage Notes (Signed)
Patient brought in by [employer] voluntarily for SI with out any plan except "just something really fast'. Patient states that he has a lot going on recently.  Just found out that his uncle has cancer and has about 4 months to live.  Adds that his mother is now drinking ETOH everyday now that she has found out about her brother dying.  Patient and his girlfriend recently broke up, so patient states that he hasnt been able to eat last couple of days but has been drinking "a lot of alcohol daily".  Patient also admits to smoking THC as well as cigarettes.  Patient states "ive tried stuff before", as he rubs old scars on his left arm.  Patient states that he hasnt been taking his Prozac, "because I just didn't feel like taking it", it several weeks.

## 2016-04-24 NOTE — ED Notes (Signed)
[facility] is here to transport 

## 2016-04-24 NOTE — ED Notes (Signed)
Up to the bathroom 

## 2016-04-24 NOTE — ED Notes (Signed)
[facility] will call when pt can transport

## 2016-04-24 NOTE — ED Notes (Signed)
Pt OK to transport per Phyliss

## 2016-04-24 NOTE — ED Notes (Signed)
[facility] contacted for transport

## 2016-04-24 NOTE — ED Notes (Signed)
Pt ambulatory w/o difficulty to [facility] with [place], belongings given to driver.  [facility] notified that pt is in transit.

## 2016-04-24 NOTE — ED Notes (Signed)
Report called, will call in 30 mins to confirm that pt can transport.
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
## 2016-04-24 NOTE — Progress Notes (Signed)
CM spoke with pt who confirms uninsured [place] resident with no pcp.  CM discussed and provided written information to assist pt with determining choice for uninsured accepting pcps, discussed the importance of pcp vs EDP services for f/u care, www.needymeds.org, www.goodrx.com, discounted pharmacies and other [place] resources such as [facility] , [facility], affordable care act, financial assistance, uninsured dental services, [facility] med assist, DSS and  health department  Reviewed resources for [place] uninsured accepting pcps like [facility], [facility] [address], [facility], [facility], local urgent care centers, [facility], [facility], general [facility] clinics, [facility], [facility] plus others, medication resources, [facility] out patient pharmacies and housing Pt voiced understanding and appreciation of resources provided   Provided P4[facility] contact information Pt agreed to a referral Cm completed referral Pt to be contact by [facility] clinical liaison  

## 2016-04-24 NOTE — ED Notes (Signed)
Pt. Received a breakfast tray. Nurse aware.  
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
## 2016-04-24 NOTE — ED Notes (Signed)
pt can not transport yet per Phyliss

## 2016-04-25 DIAGNOSIS — F331 Major depressive disorder, recurrent, moderate: Principal | ICD-10-CM

## 2016-04-25 DIAGNOSIS — F17200 Nicotine dependence, unspecified, uncomplicated: Secondary | ICD-10-CM

## 2016-04-25 MED ORDER — TRAZODONE HCL 50 MG PO TABS
150.0000 mg | ORAL_TABLET | Freq: Every day | ORAL | Status: DC
  Administered 2016-04-25 – 2016-04-27 (×3): 150 mg via ORAL
  Filled 2016-04-25 (×3): qty 1

## 2016-04-25 MED ORDER — FLUOXETINE HCL 20 MG PO CAPS
20.0000 mg | ORAL_CAPSULE | Freq: Every day | ORAL | Status: DC
  Administered 2016-04-26 – 2016-04-28 (×3): 20 mg via ORAL
  Filled 2016-04-25 (×3): qty 1
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
## 2016-04-25 NOTE — Plan of Care (Signed)
Problem: [facility] Participation in Recreation Therapeutic Interventions Goal: STG-Patient will identify at least five coping skills for ** STG: Coping Skills - Within 4 treatment sessions, patient will verbalize at least 5 coping skills for substance abuse in each of 2 treatment sessions to decrease substance abuse post d/c.  Outcome: Progressing Treatment Session 1; Completed 1 out of 2: At approximately 3:00 pm, LRT met with patient in consult room. Patient verbalized 5 coping skills for substance abuse. LRT educated patient on leisure and why it is important to implement it into his schedule. LRT educated and provided patient with blank schedules to help him plan his day and try to avoid using substances. LRT educated patient on healthy support systems.  Leonette [facility], LRT/CTRS 08.25.17 3:18 pm Goal: STG-Other Recreation Therapy Goal (Specify) STG: Stress Management - Within 4 treatment sessions, patient will verbalize understanding of the stress management techniques in each of 2 treatment sessions to increase stress management skills post d/c.  Outcome: Progressing Treatment Session 1; Completed 1 out of 2: At approximately 3:00 pm, LRT met with patient in consult room. LRT educated and provided patient with handouts on stress management techniques. Patient verbalized understanding. LRT encouraged patient to read over and practice the stress management techniques.  Leonette [facility], LRT/CTRS 08.25.17 3:20 pm

## 2016-04-25 NOTE — Progress Notes (Signed)
NUTRITION ASSESSMENT  Pt identified as at risk on the Malnutrition Screen Tool  INTERVENTION: 1. Reinforce the importance of nutrition and encourage intake of food and beverages. 2. Supplements:recommend MVI; if po intake inadequate, nutritional supplements such as Ensure or [employer] Instant Breakfast available for ordering  NUTRITION DIAGNOSIS: Inadequate oral intake related to poor appetite as evidenced by patient report  Goal: Pt to meet >/= 90% of their estimated nutrition needs.  Monitor:  PO intake  Assessment:   35 y.o. male admitted with major depressive disorder, recurrent, moderate. Pt also with EtOH dependence, cocaine and cannabis use disorder. Poor appetite with small amount of wt loss prior to admission. For the past month and a half, pt has been drinking a 12 pack of beer per day and using cocaine daily  Height: Ht Readings from Last 1 Encounters:  04/24/16 5\' 10"  (1.778 m)    Weight: Wt Readings from Last 1 Encounters:  04/24/16 183 lb (83 kg)    Weight Hx: Wt Readings from Last 10 Encounters:  04/24/16 183 lb (83 kg)  04/24/16 190 lb (86.2 kg)  10/10/15 200 lb (90.7 kg)  08/04/15 193 lb (87.5 kg)    BMI:  Body mass index is 26.26 kg/m.   Estimated Nutritional Needs: Kcal: 25-30 kcal/kg Protein: > 1 gram protein/kg Fluid: 1 ml/kcal  Diet Order: Diet regular Room service appropriate? Yes; Fluid consistency: Thin Pt is also offered choice of scheduled unit snacks. Pt is eating as desired. Recorded po intake 90-100% of meals  Lab results and medications reviewed.   Romelle Starcherate Quinlee Sciarra MS, RD, LDN [phone] Pager  [phone] Weekend/On-Call Pager

## 2016-04-25 NOTE — [facility] Group Notes (Signed)
[facility] LCSW Group Therapy  04/25/2016 9:53 AM  Type of Therapy:  Group Therapy  Participation Level:  Pt did not attend group. CSW invited pt to group.   Summary of Progress/Problems:Feelings around Relapse. Group members discussed the meaning of relapse and shared personal stories of relapse, how it affected them and others, and how they perceived themselves during this time. Group members were encouraged to identify triggers, warning signs and coping skills used when facing the possibility of relapse. Social supports were discussed and explored in detail. Patients also discussed facing disappointment and how that can trigger someone to relapse.   Nastasha Reising G. Garnette CzechSampson MSW, LCSWA 04/25/2016, 9:53 AM

## 2016-04-25 NOTE — Care Management (Signed)
PCP appointment scheduled: [facility] PCP appointment to f/u with for Dyslipidemia. Appointment 05/09/16 at 9:15 am. Patient Copay of $25.00
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
## 2016-04-25 NOTE — Progress Notes (Signed)
Recreation Therapy Notes  INPATIENT RECREATION THERAPY ASSESSMENT  Patient Details Name: Brian Nolan MRN: [id number] DOB: [birth year] Today's Date: 04/25/2016  Patient Stressors: Family, Relationship, Death (Mom has been drinking since uncle passed; going through things with girlfriend; uncle died recently)  Coping Skills:   Isolate, Arguments, Substance Abuse, Self-Injury, Exercise, Art/Dance, Talking, Music, Sports, Other (Comment) (Read Bible)  Personal Challenges: Concentration, Decision-Making, Expressing Yourself, Problem-Solving, Relationships, Self-Esteem/Confidence, Stress Management, Substance Abuse, Trusting Others  Leisure Interests (2+):  Nature - Fishing, Individual - Other (Comment) (Traveling)  Awareness of Community Resources:  No  Community Resources:     Current Use:    If no, Barriers?:    Patient Strengths:  Good heart, caring  Patient Identified Areas of Improvement:  Using drug/alcohol, be a better person, get life together  Current Recreation Participation:  Nothing positive  Patient Goal for Hospitalization:  To get treatment for substance abuse  [place]  [place] Residence[place]   Current [place] (including self-harm):  No ("Not really")  Current HI:  No  Consent to Intern Participation: N/A   Brian Nolan,Brian Nolan, LRT/CTRS 04/25/2016, 2:50 PM

## 2016-04-25 NOTE — Tx Team (Signed)
Brian Nolan [place]  Pt name [id number]    MDD (major depressive disorder), recurrent episode, moderate (HCC)  Principal Problem Principal Problem:   MDD (major depressive disorder), recurrent episode, moderate (HCC) Active Problems:   Cocaine use disorder, mild, abuse   Cannabis use disorder, mild, abuse   Hyperlipidemia   Alcohol use disorder, severe, dependence (HCC)   Tobacco use disorder  Secondary Dx/Hospital Problem List Current Facility-Administered Medications  Medication Dose Route Frequency Provider Last Rate Last Dose  . acetaminophen (TYLENOL) tablet 650 mg  650 mg Oral Q6H PRN Jimmy FootmanAndrea Hernandez-Gonzalez, MD      . alum & mag hydroxide-simeth (MAALOX/MYLANTA) 200-200-20 MG/5ML suspension 30 mL  30 mL Oral Q4H PRN Jimmy FootmanAndrea Hernandez-Gonzalez, MD      . FLUoxetine (PROZAC) capsule 40 mg  40 mg Oral Daily Jimmy FootmanAndrea Hernandez-Gonzalez, MD   40 mg at 04/25/16 0916  . gemfibrozil (LOPID) tablet 600 mg  600 mg Oral BID AC Jimmy FootmanAndrea Hernandez-Gonzalez, MD   600 mg at 04/25/16 [id number]  . hydrOXYzine (ATARAX/VISTARIL) tablet 25 mg  25 mg Oral TID PRN Jimmy FootmanAndrea Hernandez-Gonzalez, MD      . ibuprofen (ADVIL,MOTRIN) tablet 600 mg  600 mg Oral Q8H PRN Jimmy FootmanAndrea Hernandez-Gonzalez, MD      . magnesium hydroxide (MILK OF MAGNESIA) suspension 30 mL  30 mL Oral Daily PRN Jimmy FootmanAndrea Hernandez-Gonzalez, MD      . nicotine (NICODERM CQ - dosed in mg/24 hours) patch 21 mg  21 mg Transdermal Daily Jimmy FootmanAndrea Hernandez-Gonzalez, MD   21 mg at 04/25/16 0916  . traZODone (DESYREL) tablet 100 mg  100 mg Oral QHS Jimmy FootmanAndrea Hernandez-Gonzalez, MD   100 mg at 04/24/16 2127     Current Meds Prescriptions Prior to Admission  Medication Sig Dispense Refill Last Dose  . ARIPiprazole (ABILIFY) 5 MG tablet Take 1 tablet (5 mg total) by mouth daily. (Patient not taking: Reported on 04/24/2016) 30 tablet 0 Not Taking at Unknown time  . FLUoxetine (PROZAC) 40 MG capsule Take 1 capsule (40 mg total) by mouth daily. 30 capsule 0 Past Month at Unknown  time  . gemfibrozil (LOPID) 600 MG tablet Take 1 tablet (600 mg total) by mouth 2 (two) times daily before a meal. (Patient not taking: Reported on 04/24/2016) 60 tablet 0 Not Taking at Unknown time  . hydrOXYzine (ATARAX/VISTARIL) 25 MG tablet Take 1 tablet (25 mg total) by mouth 3 (three) times daily as needed (agitation). (Patient not taking: Reported on 04/24/2016) 30 tablet 0 Not Taking at Unknown time  . nicotine (NICODERM CQ - DOSED IN MG/24 HOURS) 21 mg/24hr patch Place 1 patch (21 mg total) onto the skin daily. (Patient not taking: Reported on 04/24/2016) 28 patch 0 Not Taking at Unknown time  . traZODone (DESYREL) 100 MG tablet Take 1 tablet (100 mg total) by mouth at bedtime. 30 tablet 0 Past Month at Unknown time     Prior to Admission Meds   Interdisciplinary Treatment and Diagnostic Plan Update  04/25/2016 Time of Session: 11:49 AM  Brian Nolan Piacentini MRN: [id number]  Principal Diagnosis: MDD (major depressive disorder), recurrent episode, moderate (HCC)  Secondary Diagnoses: Principal Problem:   MDD (major depressive disorder), recurrent episode, moderate (HCC) Active Problems:   Cocaine use disorder, mild, abuse   Cannabis use disorder, mild, abuse   Hyperlipidemia   Alcohol use disorder, severe, dependence (HCC)   Tobacco use disorder   Current Medications:  Current Facility-Administered Medications  Medication Dose Route Frequency Provider Last Rate Last Dose  . acetaminophen (TYLENOL) tablet  650 mg  650 mg Oral Q6H PRN Jimmy Footman, MD      . alum & mag hydroxide-simeth (MAALOX/MYLANTA) 200-200-20 MG/5ML suspension 30 mL  30 mL Oral Q4H PRN Jimmy Footman, MD      . FLUoxetine (PROZAC) capsule 40 mg  40 mg Oral Daily Jimmy Footman, MD   40 mg at 04/25/16 0916  . gemfibrozil (LOPID) tablet 600 mg  600 mg Oral BID AC Jimmy Footman, MD   600 mg at 04/25/16 1610  . hydrOXYzine (ATARAX/VISTARIL) tablet 25 mg  25 mg Oral TID PRN  Jimmy Footman, MD      . ibuprofen (ADVIL,MOTRIN) tablet 600 mg  600 mg Oral Q8H PRN Jimmy Footman, MD      . magnesium hydroxide (MILK OF MAGNESIA) suspension 30 mL  30 mL Oral Daily PRN Jimmy Footman, MD      . nicotine (NICODERM CQ - dosed in mg/24 hours) patch 21 mg  21 mg Transdermal Daily Jimmy Footman, MD   21 mg at 04/25/16 0916  . traZODone (DESYREL) tablet 100 mg  100 mg Oral QHS Jimmy Footman, MD   100 mg at 04/24/16 2127    PTA Medications: Prescriptions Prior to Admission  Medication Sig Dispense Refill Last Dose  . ARIPiprazole (ABILIFY) 5 MG tablet Take 1 tablet (5 mg total) by mouth daily. (Patient not taking: Reported on 04/24/2016) 30 tablet 0 Not Taking at Unknown time  . FLUoxetine (PROZAC) 40 MG capsule Take 1 capsule (40 mg total) by mouth daily. 30 capsule 0 Past Month at Unknown time  . gemfibrozil (LOPID) 600 MG tablet Take 1 tablet (600 mg total) by mouth 2 (two) times daily before a meal. (Patient not taking: Reported on 04/24/2016) 60 tablet 0 Not Taking at Unknown time  . hydrOXYzine (ATARAX/VISTARIL) 25 MG tablet Take 1 tablet (25 mg total) by mouth 3 (three) times daily as needed (agitation). (Patient not taking: Reported on 04/24/2016) 30 tablet 0 Not Taking at Unknown time  . nicotine (NICODERM CQ - DOSED IN MG/24 HOURS) 21 mg/24hr patch Place 1 patch (21 mg total) onto the skin daily. (Patient not taking: Reported on 04/24/2016) 28 patch 0 Not Taking at Unknown time  . traZODone (DESYREL) 100 MG tablet Take 1 tablet (100 mg total) by mouth at bedtime. 30 tablet 0 Past Month at Unknown time    Treatment Modalities: Medication Management, Group therapy, Case management,  1 to 1 session with clinician, Psychoeducation, Recreational therapy.   Physician Treatment Plan for Primary Diagnosis: MDD (major depressive disorder), recurrent episode, moderate (HCC) Long Term Goal(s): Improvement in symptoms so as  ready for discharge  Short Term Goals: Ability to verbalize feelings will improve and Ability to disclose and discuss suicidal ideas  Medication Management: Evaluate patient's response, side effects, and tolerance of medication regimen.  Therapeutic Interventions: 1 to 1 sessions, Unit Group sessions and Medication administration.  Evaluation of Outcomes: Progressing  Physician Treatment Plan for Secondary Diagnosis: Principal Problem:   MDD (major depressive disorder), recurrent episode, moderate (HCC) Active Problems:   Cocaine use disorder, mild, abuse   Cannabis use disorder, mild, abuse   Hyperlipidemia   Alcohol use disorder, severe, dependence (HCC)   Tobacco use disorder   Long Term Goal(s): Improvement in symptoms so as ready for discharge  Short Term Goals: Ability to identify changes in lifestyle to reduce recurrence of condition will improve and Ability to identify triggers associated with substance abuse/mental health issues will improve  Medication Management: Evaluate patient's  response, side effects, and tolerance of medication regimen.  Therapeutic Interventions: 1 to 1 sessions, Unit Group sessions and Medication administration.  Evaluation of Outcomes: Progressing   RN Treatment Plan for Primary Diagnosis: MDD (major depressive disorder), recurrent episode, moderate (HCC) Long Term Goal(s): Knowledge of disease and therapeutic regimen to maintain health will improve  Short Term Goals: Ability to demonstrate self-control and Ability to participate in decision making will improve  Medication Management: RN will administer medications as ordered by provider, will assess and evaluate patient's response and provide education to patient for prescribed medication. RN will report any adverse and/or side effects to prescribing provider.  Therapeutic Interventions: 1 on 1 counseling sessions, Psychoeducation, Medication administration, Evaluate responses to treatment,  Monitor vital signs and CBGs as ordered, Perform/monitor CIWA, COWS, AIMS and Fall Risk screenings as ordered, Perform wound care treatments as ordered.  Evaluation of Outcomes: Progressing   LCSW Treatment Plan for Primary Diagnosis: MDD (major depressive disorder), recurrent episode, moderate (HCC) Long Term Goal(s): Safe transition to appropriate next level of care at discharge, Engage patient in therapeutic group addressing interpersonal concerns.  Short Term Goals: Engage patient in aftercare planning with referrals and resources, Increase social support and Facilitate acceptance of mental health diagnosis and concerns  Therapeutic Interventions: Assess for all discharge needs, 1 to 1 time with Social worker, Explore available resources and support systems, Assess for adequacy in community support network, Educate family and significant other(s) on suicide prevention, Complete Psychosocial Assessment, Interpersonal group therapy.  Evaluation of Outcomes: Progressing   Progress in Treatment: Attending groups: Yes Participating in groups: Yes Taking medication as prescribed: Yes, MD continues to assess for medication changes as needed Toleration medication: Yes, no side effects reported at this time Family/Significant other contact made:  Patient understands diagnosis:  Discussing patient identified problems/goals with staff: Yes Medical problems stabilized or resolved: Yes Denies suicidal/homicidal ideation:  Issues/concerns per patient self-inventory: None Other: N/A  New problem(s) identified: None identified at this time.   New Short Term/Long Term Goal(s): None identified at this time.   Discharge Plan or Barriers: see below  Reason for Continuation of Hospitalization: Anxiety Delusions  Depression Hallucinations Homicidal ideation [place] Medical Issues Medication stabilization Suicidal ideation Withdrawal symptoms  Estimated Length of Stay: 3-5  days  Attendees: Patient: 04/25/2016  11:49 AM  Physician: Radene Journey 04/25/2016  11:49 AM  Nursing: Leonia Reader 04/25/2016  11:49 AM  RN Care Manager: 04/25/2016  11:49 AM  Social Worker: Hampton Abbot 04/25/2016  11:49 AM  Recreational Therapist: Hershal Coria 04/25/2016  11:49 AM  Other:  04/25/2016  11:49 AM  Other:  04/25/2016  11:49 AM  Other: 04/25/2016  11:49 AM    Scribe for Treatment Team: Hampton Abbot, MSW, LCSW-A

## 2016-04-25 NOTE — [facility] Suicide Risk Assessment (Signed)
[facility] INPATIENT:  Family/Significant Other Suicide Prevention Education  Suicide Prevention Education:  Patient Refusal for Family/Significant Other Suicide Prevention Education: The patient Brian Nolan has refused to provide written consent for family/significant other to be provided Family/Significant Other Suicide Prevention Education during admission and/or prior to discharge.  Physician notified. Patient does not think that contacting family would be in his best interest at this time.   Lynden OxfordKadijah R Ashaunti Treptow, MSW, LCSW-A 04/25/2016, 12:28 PM

## 2016-04-26 MED ORDER — HYDROXYZINE HCL 25 MG PO TABS
25.0000 mg | ORAL_TABLET | Freq: Three times a day (TID) | ORAL | Status: DC | PRN
  Administered 2016-04-27: 25 mg via ORAL

## 2016-04-26 MED ORDER — ENSURE ENLIVE PO LIQD
237.0000 mL | Freq: Two times a day (BID) | ORAL | Status: DC
  Administered 2016-04-26 – 2016-04-28 (×3): 237 mL via ORAL

## 2016-04-26 NOTE — [facility] Group Notes (Signed)
[facility] LCSW Group Therapy  04/26/2016 3:11 PM  Type of Therapy:  Group Therapy  Participation Level:  Active  Participation Quality:  Appropriate, Attentive and Sharing  Affect:  Appropriate  Cognitive:  Appropriate  Insight:  Developing/Improving  Engagement in Therapy:  Engaged  Modes of Intervention:  Activity, Discussion, Education and Support  Summary of Progress/Problems:Communications: Patients identify how individuals communicate with one another appropriately and inappropriately. Patients will be guided to discuss their thoughts, feelings, and behaviors related to barriers when communicating. The group will process together ways to execute positive and appropriate communications. Pt was engaged in the group discussion. Pt stated he could improve his communication with others by remaining calm and try to express exactly what he is feeling. CSW provided support to pt and discussed effective communication skills.   Brian Nolan G. Garnette CzechSampson MSW, LCSWA 04/26/2016, 3:13 PM

## 2016-04-26 NOTE — Progress Notes (Signed)
Patient is pleasant and cooperative. Denies SI/HI/AVH.  Visible in milieu, interacting with peers and staff appropriately. Medication and group compliant.  Requested to have his phone charged so that he could his numbers out because could not get them when admitted due to phone being dead.  Phone charged and patient allowed to get phone numbers.  Support and encouragement offered.  Safety maintained.

## 2016-04-26 NOTE — Plan of Care (Signed)
Problem: Education: Goal: Knowledge of the prescribed therapeutic regimen will improve Outcome: Progressing Able to identify medications and verbalize the purpose of each medicatipm

## 2016-04-26 NOTE — Progress Notes (Signed)
[facility] MD Progress Note  04/26/2016 12:25 PM Brian Nolan  MRN:  [id number] Subjective:  Patient reports feeling much better now that he is back on his medications. He is no longer feeling suicidal helpless or hopeless. He said that last night he did have thoughts of suicide have difficulty falling asleep. This morning he is feeling anxious and requests a medication for anxiety. He is concerned about his weight because he says that since using drugs his weight has gone down his asking for ensure.   He denies any physical complaints. He denies any side effects from medications.   Per nurse's he has been calm and cooperative. He has been compliant with medications and groups.  Principal Problem: MDD (major depressive disorder), recurrent episode, moderate (HCC) Diagnosis:   Patient Active Problem List   Diagnosis Date Noted  . Alcohol use disorder, severe, dependence (HCC) [F10.20] 04/25/2016  . Tobacco use disorder [F17.200] 04/25/2016  . Hyperlipidemia [E78.5] 10/12/2015  . MDD (major depressive disorder), recurrent episode, moderate (HCC) [F33.1] 10/11/2015  . Cocaine use disorder, mild, abuse [F14.10] 10/11/2015  . Cannabis use disorder, mild, abuse [F12.10] 10/11/2015   Total Time spent with patient: 30 minutes  Past Psychiatric History:   Past Medical History:  Past Medical History:  Diagnosis Date  . Depression   . Schizophrenia (HCC)   . Substance abuse     Past Surgical History:  Procedure Laterality Date  . NO PAST SURGERIES     Family History:  Family History  Problem Relation Age of Onset  . Schizophrenia Maternal Uncle    Family Psychiatric  History:  Social History:  History  Alcohol Use  . Yes     History  Drug Use  . Types: "Crack" cocaine, Marijuana    Social History   Social History  . Marital status: Single    Spouse name: N/A  . Number of children: N/A  . Years of education: N/A   Social History Main Topics  . Smoking status: Current Every Day  Smoker    Packs/day: 1.00    Types: Cigarettes  . Smokeless tobacco: Never Used  . Alcohol use Yes  . Drug use:     Types: "Crack" cocaine, Marijuana  . Sexual activity: Yes    Birth control/ protection: Condom   Other Topics Concern  . None   Social History Narrative  . None   Additional Social History:    Pain Medications: denies Prescriptions: see PTA meds Over the Counter: denies History of alcohol / drug use?: Yes    Current Medications: Current Facility-Administered Medications  Medication Dose Route Frequency Provider Last Rate Last Dose  . acetaminophen (TYLENOL) tablet 650 mg  650 mg Oral Q6H PRN Jimmy Footman, MD      . alum & mag hydroxide-simeth (MAALOX/MYLANTA) 200-200-20 MG/5ML suspension 30 mL  30 mL Oral Q4H PRN Jimmy Footman, MD      . feeding supplement (ENSURE ENLIVE) (ENSURE ENLIVE) liquid 237 mL  237 mL Oral BID BM Jimmy Footman, MD      . FLUoxetine (PROZAC) capsule 20 mg  20 mg Oral Daily Jimmy Footman, MD   20 mg at 04/26/16 0919  . gemfibrozil (LOPID) tablet 600 mg  600 mg Oral BID AC Jimmy Footman, MD   600 mg at 04/26/16 0919  . hydrOXYzine (ATARAX/VISTARIL) tablet 25 mg  25 mg Oral TID PRN Jimmy Footman, MD      . hydrOXYzine (ATARAX/VISTARIL) tablet 25 mg  25 mg Oral TID PRN Sue Lush  Hernandez-Gonzalez, MD      . ibuprofen (ADVIL,MOTRIN) tablet 600 mg  600 mg Oral Q8H PRN Jimmy FootmanAndrea Hernandez-Gonzalez, MD      . magnesium hydroxide (MILK OF MAGNESIA) suspension 30 mL  30 mL Oral Daily PRN Jimmy FootmanAndrea Hernandez-Gonzalez, MD      . nicotine (NICODERM CQ - dosed in mg/24 hours) patch 21 mg  21 mg Transdermal Daily Jimmy FootmanAndrea Hernandez-Gonzalez, MD   21 mg at 04/26/16 0919  . traZODone (DESYREL) tablet 150 mg  150 mg Oral QHS Jimmy FootmanAndrea Hernandez-Gonzalez, MD   150 mg at 04/25/16 2149    Lab Results: No results found for this or any previous visit (from the past 48 hour(s)).  Blood Alcohol level:   Lab Results  Component Value Date   ETH <5 04/24/2016   ETH 97 (H) 10/10/2015    Metabolic Disorder Labs: Lab Results  Component Value Date   HGBA1C 5.4 10/12/2015   MPG 108 10/12/2015   No results found for: PROLACTIN Lab Results  Component Value Date   CHOL 203 (H) 10/12/2015   TRIG 444 (H) 10/12/2015   HDL 60 10/12/2015   CHOLHDL 3.4 10/12/2015   VLDL UNABLE TO CALCULATE IF TRIGLYCERIDE OVER 400 mg/dL 16/10/960402/06/2016   LDLCALC UNABLE TO CALCULATE IF TRIGLYCERIDE OVER 400 mg/dL 54/09/811902/06/2016    Physical Findings: AIMS: Facial and Oral Movements Muscles of Facial Expression: None, normal Lips and Perioral Area: None, normal Jaw: None, normal Tongue: None, normal,Extremity Movements Upper (arms, wrists, hands, fingers): None, normal Lower (legs, knees, ankles, toes): None, normal, Trunk Movements Neck, shoulders, hips: None, normal, Overall Severity Severity of abnormal movements (highest score from questions above): None, normal Incapacitation due to abnormal movements: None, normal Patient's awareness of abnormal movements (rate only patient's report): No Awareness, Dental Status Current problems with teeth and/or dentures?: No Does patient usually wear dentures?: No  CIWA:  CIWA-Ar Total: 4 COWS:     Musculoskeletal: Strength & Muscle Tone: within normal limits Gait & Station: normal Patient leans: N/A  Psychiatric Specialty Exam: Physical Exam  Constitutional: He is oriented to person, place, and time. He appears well-developed and well-nourished.  HENT:  Head: Normocephalic and atraumatic.  Eyes: EOM are normal.  Neck: Normal range of motion.  Respiratory: Effort normal.  Musculoskeletal: Normal range of motion.  Neurological: He is alert and oriented to person, place, and time.    Review of Systems  Constitutional: Negative.   HENT: Negative.   Eyes: Negative.   Respiratory: Negative.   Cardiovascular: Negative.   Gastrointestinal: Negative.    Genitourinary: Negative.   Musculoskeletal: Negative.   Skin: Negative.   Neurological: Negative.   Endo/Heme/Allergies: Negative.   Psychiatric/Behavioral: Positive for depression and substance abuse. Negative for hallucinations and suicidal ideas. The patient is nervous/anxious and has insomnia.     Blood pressure 93/70, pulse 62, temperature 98.1 F (36.7 C), temperature source Oral, resp. rate 18, height 5\' 10"  (1.778 m), weight 83 kg (183 lb), SpO2 97 %.Body mass index is 26.26 kg/m.  General Appearance: Well Groomed  Eye Contact:  Good  Speech:  Clear and Coherent  Volume:  Normal  Mood:  Anxious  Affect:  Appropriate  Thought Process:  Linear and Descriptions of Associations: Intact  Orientation:  Full (Time, Place, and Person)  Thought Content:  Hallucinations: None  Suicidal Thoughts:  No  Homicidal Thoughts:  No  Memory:  Immediate;   Good Recent;   Good Remote;   Good  Judgement:  Fair  Insight:  Fair  Psychomotor Activity:  Normal  Concentration:  Concentration: Good and Attention Span: Good  Recall:  Good  Fund of Knowledge:  Good  Language:  Good  Akathisia:  No  Handed:    AIMS (if indicated):     Assets:  Communication Skills Social Support  ADL's:  Intact  Cognition:  WNL  Sleep:  Number of Hours: 7.15     Treatment Plan Summary:  Major depressive disorder the patient will be continued on fluoxetine 20 mg a day  For insomnia: continue trazodone 150 mg by mouth daily at bedtime  Alcohol use disorder, cocaine use disorder, cannabis use disorder: [facility]  Alcohol withdrawal: No evidence at this time of alcohol withdrawal  Tobacco use disorder: pt has been receiving  nicotine patch of 21 mg a day  Dyslipidemia: Continue Lopid 600 mg twice a day.  Disposition possible discharge on Monday to [facility]  Follow-up: [facility] in [place].    Precautions every 15 minute  checks  Diet regular  Hospitalization status: voluntary  Case manager: pt needs a primary care in [place] for management of dyslipidemia    Jimmy Footman, MD 04/26/2016, 12:25 PM

## 2016-04-26 NOTE — Progress Notes (Signed)
Pt denies SI/HI/AVH. Denies pain. Medication compliant. Visible in milieu socializing with peers. Appropriate during interaction. Voices no additional concerns at this time. Safety maintained.

## 2016-04-26 NOTE — [facility] Group Notes (Signed)
[facility] Group Notes:  (Nursing/MHT/Case Management/Adjunct)  Date:  04/26/2016  Time:  4:51 AM  Type of Therapy:  Psychoeducational Skills  Participation Level:  Active  Participation Quality:  Appropriate  Affect:  Appropriate  Cognitive:  Appropriate  Insight:  Appropriate and Good  Engagement in Group:  Engaged  Modes of Intervention:  Discussion, Socialization and Support  Summary of Progress/Problems:  Brian Nolan 04/26/2016, 4:51 AM

## 2016-04-26 NOTE — [facility] Group Notes (Signed)
[facility] Group Notes:  (Nursing/MHT/Case Management/Adjunct)  Date:  04/26/2016  Time:  9:55 PM  Type of Therapy:  Group Therapy  Participation Level:  Active  Participation Quality:  Appropriate  Affect:  Appropriate  Cognitive:  Alert  Insight:  Appropriate  Engagement in Group:  Engaged  Modes of Intervention:  Support  Summary of Progress/Problems:  Brian NeerJackie L Janice Seales 04/26/2016, 9:55 PM

## 2016-04-27 MED ORDER — GEMFIBROZIL 600 MG PO TABS: 600.0000 mg | ORAL_TABLET | Freq: Two times a day (BID) | ORAL | 0 refills | Status: DC

## 2016-04-27 MED ORDER — TRAZODONE HCL 150 MG PO TABS: 150.0000 mg | ORAL_TABLET | Freq: Every day | ORAL | 0 refills | Status: DC

## 2016-04-27 MED ORDER — FLUOXETINE HCL 20 MG PO CAPS: 20.0000 mg | ORAL_CAPSULE | Freq: Every day | ORAL | 0 refills | Status: DC

## 2016-04-27 NOTE — [facility] Suicide Risk Assessment (Signed)
[facility] Discharge Suicide Risk Assessment   Principal Problem: MDD (major depressive disorder), recurrent episode, moderate (HCC) Discharge Diagnoses:  Patient Active Problem List   Diagnosis Date Noted  . Alcohol use disorder, severe, dependence (HCC) [F10.20] 04/25/2016  . Tobacco use disorder [F17.200] 04/25/2016  . Hyperlipidemia [E78.5] 10/12/2015  . MDD (major depressive disorder), recurrent episode, moderate (HCC) [F33.1] 10/11/2015  . Cocaine use disorder, mild, abuse [F14.10] 10/11/2015  . Cannabis use disorder, mild, abuse [F12.10] 10/11/2015     Psychiatric Specialty Exam: Review of Systems  Constitutional: Negative.   HENT: Negative.   Eyes: Negative.   Respiratory: Negative.   Cardiovascular: Negative.   Gastrointestinal: Negative.   Genitourinary: Negative.   Musculoskeletal: Negative.   Skin: Negative.   Neurological: Negative.   Endo/Heme/Allergies: Negative.   Psychiatric/Behavioral: Positive for depression and substance abuse. Negative for suicidal ideas.    Blood pressure 113/79, pulse 71, temperature 98 F (36.7 C), temperature source Oral, resp. rate 18, height 5\' 10"  (1.778 m), weight 83 kg (183 lb), SpO2 97 %.Body mass index is 26.26 kg/m.                                                       Mental Status Per Nursing Assessment::   On Admission:  Self-harm thoughts  Demographic Factors:  Male and Low socioeconomic status  Loss Factors: Financial problems/change in socioeconomic status  Historical Factors: Impulsivity  Risk Reduction Factors:   Positive social support  Continued Clinical Symptoms:  Depression:   Comorbid alcohol abuse/dependence Impulsivity Alcohol/Substance Abuse/Dependencies Previous Psychiatric Diagnoses and Treatments  Cognitive Features That Contribute To Risk:  None    Suicide Risk:  Minimal: No identifiable suicidal ideation.  Patients presenting with no risk factors but with morbid  ruminations; may be classified as minimal risk based on the severity of the depressive symptoms  Follow-up Information    [facility]. Go on 04/08/2017.   Why:  Please bring $25.00 copay Contact information: [facility] [address] [place], [place] [address] Phone [phone]        [facility]. Go on 05/01/2016.   Why:  [facility] at 7:45AM. It is important to arrive 15 minutes early to fill out needed paperwork. Please bring discharge paperwork to your appointment. Contact information: [address] [place], [place] [address] Phone #: [phone] Fax #: [phone]           Jimmy FootmanHernandez-Gonzalez,  Jazper Nikolai, MD 04/28/2016, 10:28 AM

## 2016-04-27 NOTE — [facility] Group Notes (Signed)
[facility] Group Notes:  (Nursing/MHT/Case Management/Adjunct)  Date:  04/27/2016  Time:  11:06 PM  Type of Therapy:  Evening Wrap-up Group  Participation Level:  Active  Participation Quality:  Appropriate and Attentive  Affect:  Appropriate  Cognitive:  Alert and Appropriate  Insight:  Appropriate and Good  Engagement in Group:  Developing/Improving and Engaged  Modes of Intervention:  Activity  Summary of Progress/Problems:  Tomasita MorrowChelsea Nanta Malikhi Ogan 04/27/2016, 11:06 PM

## 2016-04-27 NOTE — Progress Notes (Signed)
Patient with blunted affect, cooperative behavior with meals, meds and plan of care. No SI/HI at this time. Good eye contact and verbalizes needs appropriately. Patient encouraged to attend therapy groups to learn and initiate coping skills. Safety maintained.

## 2016-04-27 NOTE — Progress Notes (Signed)
[facility] MD Progress Note  04/27/2016 11:31 AM Brian Nolan  MRN:  [id number] Subjective:  Patient reports feeling much better now that he is back on his medications. He is no longer feeling suicidal helpless or hopeless. He no longer is feeling suicidal. He denies any homicidal thoughts or hallucinations. He denies major problems with his sleep last night. His appetite, energy and concentration are improving. Patient is motivated for recovery and is looking forward to start substance abuse treatment.  He denies any physical complaints. He denies any side effects from medications.   Per nurse's he has been calm and cooperative. He has been compliant with medications and groups.  Per nursing: Patient with blunted affect, cooperative behavior with meals, meds and plan of care. No SI/HI at this time. Good eye contact and verbalizes needs appropriately. Patient encouraged to attend therapy groups to learn and initiate coping skills. Safety maintained.   Principal Problem: MDD (major depressive disorder), recurrent episode, moderate (HCC) Diagnosis:   Patient Active Problem List   Diagnosis Date Noted  . Alcohol use disorder, severe, dependence (HCC) [F10.20] 04/25/2016  . Tobacco use disorder [F17.200] 04/25/2016  . Hyperlipidemia [E78.5] 10/12/2015  . MDD (major depressive disorder), recurrent episode, moderate (HCC) [F33.1] 10/11/2015  . Cocaine use disorder, mild, abuse [F14.10] 10/11/2015  . Cannabis use disorder, mild, abuse [F12.10] 10/11/2015   Total Time spent with patient: 30 minutes  Past Psychiatric History:   Past Medical History:  Past Medical History:  Diagnosis Date  . Depression   . Schizophrenia (HCC)   . Substance abuse     Past Surgical History:  Procedure Laterality Date  . NO PAST SURGERIES     Family History:  Family History  Problem Relation Age of Onset  . Schizophrenia Maternal Uncle    Family Psychiatric  History:  Social History:  History  Alcohol Use   . Yes     History  Drug Use  . Types: "Crack" cocaine, Marijuana    Social History   Social History  . Marital status: Single    Spouse name: N/A  . Number of children: N/A  . Years of education: N/A   Social History Main Topics  . Smoking status: Current Every Day Smoker    Packs/day: 1.00    Types: Cigarettes  . Smokeless tobacco: Never Used  . Alcohol use Yes  . Drug use:     Types: "Crack" cocaine, Marijuana  . Sexual activity: Yes    Birth control/ protection: Condom   Other Topics Concern  . None   Social History Narrative  . None   Additional Social History:    Pain Medications: denies Prescriptions: see PTA meds Over the Counter: denies History of alcohol / drug use?: Yes    Current Medications: Current Facility-Administered Medications  Medication Dose Route Frequency Provider Last Rate Last Dose  . acetaminophen (TYLENOL) tablet 650 mg  650 mg Oral Q6H PRN Jimmy Footman, MD      . alum & mag hydroxide-simeth (MAALOX/MYLANTA) 200-200-20 MG/5ML suspension 30 mL  30 mL Oral Q4H PRN Jimmy Footman, MD      . feeding supplement (ENSURE ENLIVE) (ENSURE ENLIVE) liquid 237 mL  237 mL Oral BID BM Jimmy Footman, MD   237 mL at 04/26/16 1436  . FLUoxetine (PROZAC) capsule 20 mg  20 mg Oral Daily Jimmy Footman, MD   20 mg at 04/27/16 4098  . gemfibrozil (LOPID) tablet 600 mg  600 mg Oral BID AC Jimmy Footman, MD  600 mg at 04/27/16 16100833  . hydrOXYzine (ATARAX/VISTARIL) tablet 25 mg  25 mg Oral TID PRN Jimmy FootmanAndrea Hernandez-Gonzalez, MD      . ibuprofen (ADVIL,MOTRIN) tablet 600 mg  600 mg Oral Q8H PRN Jimmy FootmanAndrea Hernandez-Gonzalez, MD      . magnesium hydroxide (MILK OF MAGNESIA) suspension 30 mL  30 mL Oral Daily PRN Jimmy FootmanAndrea Hernandez-Gonzalez, MD      . nicotine (NICODERM CQ - dosed in mg/24 hours) patch 21 mg  21 mg Transdermal Daily Jimmy FootmanAndrea Hernandez-Gonzalez, MD   21 mg at 04/26/16 0919  . traZODone (DESYREL)  tablet 150 mg  150 mg Oral QHS Jimmy FootmanAndrea Hernandez-Gonzalez, MD   150 mg at 04/26/16 2132    Lab Results: No results found for this or any previous visit (from the past 48 hour(s)).  Blood Alcohol level:  Lab Results  Component Value Date   ETH <5 04/24/2016   ETH 97 (H) 10/10/2015    Metabolic Disorder Labs: Lab Results  Component Value Date   HGBA1C 5.4 10/12/2015   MPG 108 10/12/2015   No results found for: PROLACTIN Lab Results  Component Value Date   CHOL 203 (H) 10/12/2015   TRIG 444 (H) 10/12/2015   HDL 60 10/12/2015   CHOLHDL 3.4 10/12/2015   VLDL UNABLE TO CALCULATE IF TRIGLYCERIDE OVER 400 mg/dL 96/04/540902/06/2016   LDLCALC UNABLE TO CALCULATE IF TRIGLYCERIDE OVER 400 mg/dL 81/19/147802/06/2016    Physical Findings: AIMS: Facial and Oral Movements Muscles of Facial Expression: None, normal Lips and Perioral Area: None, normal Jaw: None, normal Tongue: None, normal,Extremity Movements Upper (arms, wrists, hands, fingers): None, normal Lower (legs, knees, ankles, toes): None, normal, Trunk Movements Neck, shoulders, hips: None, normal, Overall Severity Severity of abnormal movements (highest score from questions above): None, normal Incapacitation due to abnormal movements: None, normal Patient's awareness of abnormal movements (rate only patient's report): No Awareness, Dental Status Current problems with teeth and/or dentures?: No Does patient usually wear dentures?: No  CIWA:  CIWA-Ar Total: 4 COWS:     Musculoskeletal: Strength & Muscle Tone: within normal limits Gait & Station: normal Patient leans: N/A  Psychiatric Specialty Exam: Physical Exam  Constitutional: He is oriented to person, place, and time. He appears well-developed and well-nourished.  HENT:  Head: Normocephalic and atraumatic.  Eyes: EOM are normal.  Neck: Normal range of motion.  Respiratory: Effort normal.  Musculoskeletal: Normal range of motion.  Neurological: He is alert and oriented to person,  place, and time.    Review of Systems  Constitutional: Negative.   HENT: Negative.   Eyes: Negative.   Respiratory: Negative.   Cardiovascular: Negative.   Gastrointestinal: Negative.   Genitourinary: Negative.   Musculoskeletal: Negative.   Skin: Negative.   Neurological: Negative.   Endo/Heme/Allergies: Negative.   Psychiatric/Behavioral: Positive for depression and substance abuse. Negative for hallucinations and suicidal ideas. The patient is not nervous/anxious and does not have insomnia.     Blood pressure 125/75, pulse 78, temperature 98 F (36.7 C), temperature source Oral, resp. rate 18, height 5\' 10"  (1.778 m), weight 83 kg (183 lb), SpO2 97 %.Body mass index is 26.26 kg/m.  General Appearance: Well Groomed  Eye Contact:  Good  Speech:  Clear and Coherent  Volume:  Normal  Mood:  Anxious  Affect:  Appropriate  Thought Process:  Linear and Descriptions of Associations: Intact  Orientation:  Full (Time, Place, and Person)  Thought Content:  Hallucinations: None  Suicidal Thoughts:  No  Homicidal Thoughts:  No  Memory:  Immediate;   Good Recent;   Good Remote;   Good  Judgement:  Fair  Insight:  Fair  Psychomotor Activity:  Normal  Concentration:  Concentration: Good and Attention Span: Good  Recall:  Good  Fund of Knowledge:  Good  Language:  Good  Akathisia:  No  Handed:    AIMS (if indicated):     Assets:  Manufacturing systems engineer Social Support  ADL's:  Intact  Cognition:  WNL  Sleep:  Number of Hours: 5     Treatment Plan Summary:  Major depressive disorder the patient will be continued on fluoxetine 20 mg a day  For insomnia: continue trazodone 150 mg by mouth daily at bedtime  Alcohol use disorder, cocaine use disorder, cannabis use disorder: [facility]  Alcohol withdrawal: No evidence at this time of alcohol withdrawal  Tobacco use disorder: pt has been receiving  nicotine  patch of 21 mg a day  Dyslipidemia: Continue Lopid 600 mg twice a day.  Disposition possible [facility] Monday [facility]  [facility]: [facility] [place].    Precautions every 15 minute checks  Diet regular  Hospitalization status: voluntary  Case manager: pt needs a primary care in [place] for management of dyslipidemia   No changes of medication regimen today. Patient is improving and is a stable   Jimmy Footman, MD 04/27/2016, 11:31 AM

## 2016-04-27 NOTE — Progress Notes (Signed)
Pt denies SI/HI/AVH. Denies pain. Attended evening group.  Medication compliant. Visible in milieu socializing with peers.  Appropriate during interaction. Voices no additional concerns at this time. Safety maintained.
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
## 2016-04-27 NOTE — Plan of Care (Signed)
Problem: Coping: Goal: Ability to cope will improve Outcome: Progressing Therapy groups encouraged.

## 2016-04-27 NOTE — [facility] Group Notes (Signed)
[facility] LCSW Group Therapy  04/27/2016 2:37 PM  Type of Therapy:  Group Therapy  Participation Level:  Active  Participation Quality:  Attentive  Affect:  Appropriate  Cognitive:  Appropriate  Insight:  Developing/Improving  Engagement in Therapy:  Improving  Modes of Intervention:  Activity, Discussion, Reality Testing and Support  Summary of Progress/Problems:Emotional Regulation: Patients will identify both negative and positive emotions. They will discuss emotions they have difficulty regulating and how they impact their lives. Patients will be asked to identify healthy coping skills to combat unhealthy reactions to negative emotions. Pt stated he is remaining positive about his future and feels he will be able to remain stable in the community if he follows his aftercare plan.   Misty Rago G. Garnette CzechSampson MSW, LCSWA 04/27/2016, 2:39 PM

## 2016-04-28 NOTE — Progress Notes (Signed)
Patient discharged home. DC instructions provided and explained. Medications reviewed. Rx given. 7 day supply of medications given. All questions answered. Pt stable at discharge. Denies SI, HI, AVH. Belongings returned.

## 2016-04-28 NOTE — Progress Notes (Signed)
  [facility] :  Will you be returning to the same living situation after discharge:  Yes, home with family. At discharge, do you have transportation home?: Yes,  home with wife. Do you have the ability to pay for your medications: Yes, [facility].  Release of information consent forms completed and in the chart;  Patient's signature needed at discharge.  Patient to Follow up at: Follow-up Information    [facility] Go on [facility]2017.   Why:  Please bring $25.00 copay Contact information: [facility] [address] [place], [place] [address] Phone [phone]        [facility] Go [facility].   Why:  [facility] at 7:45AM. It is important to arrive 15 minutes early to fill out needed paperwork. Please bring discharge paperwork to your appointment. Contact information: [address] [place], [place] [address] Phone #: [phone] Fax #: [phone]          Next level of care provider has [facility] Link:no  Safety Planning and Suicide Prevention discussed: Yes,  SPE reviewed with patient  Have you used any form of tobacco in the last 30 days? (Cigarettes, Smokeless Tobacco, Cigars, and/or Pipes): Yes  Has patient been referred to the Quitline?: Patient refused referral  Patient has been referred for addiction treatment: Yes - [facility].  Lynden OxfordKadijah R Delyla Sandeen, MSW, LCSW-A 04/28/2016, 9:35 AM

## 2016-04-28 NOTE — Tx Team (Signed)
Brian Nolan  Pt name [id number]    MDD (major depressive disorder), recurrent episode, moderate ([place])  Principal Problem Principal Problem:   MDD (major depressive disorder), recurrent episode, moderate ([place]) Active Problems:   Cocaine use disorder, mild, abuse   Cannabis use disorder, mild, abuse   Hyperlipidemia   Alcohol use disorder, severe, dependence ([place])   Tobacco use disorder  Secondary Dx/Hospital Problem List Current Facility-Administered Medications  Medication Dose Route Frequency Provider Last Rate Last Dose  . acetaminophen (TYLENOL) tablet 650 mg  650 mg Oral Q6H PRN Hildred Priest, MD      . alum & mag hydroxide-simeth (MAALOX/MYLANTA) 200-200-20 MG/5ML suspension 30 mL  30 mL Oral Q4H PRN Hildred Priest, MD      . feeding supplement (ENSURE ENLIVE) (ENSURE ENLIVE) liquid 237 mL  237 mL Oral BID BM Hildred Priest, MD   237 mL at 04/28/16 1000  . FLUoxetine (PROZAC) capsule 20 mg  20 mg Oral Daily Hildred Priest, MD   20 mg at 04/28/16 0756  . gemfibrozil (LOPID) tablet 600 mg  600 mg Oral BID AC Hildred Priest, MD   600 mg at 04/28/16 0756  . hydrOXYzine (ATARAX/VISTARIL) tablet 25 mg  25 mg Oral TID PRN Hildred Priest, MD   25 mg at 04/27/16 2141  . ibuprofen (ADVIL,MOTRIN) tablet 600 mg  600 mg Oral Q8H PRN Hildred Priest, MD      . magnesium hydroxide (MILK OF MAGNESIA) suspension 30 mL  30 mL Oral Daily PRN Hildred Priest, MD      . nicotine (NICODERM CQ - dosed in mg/24 hours) patch 21 mg  21 mg Transdermal Daily Hildred Priest, MD   21 mg at 04/28/16 0756  . traZODone (DESYREL) tablet 150 mg  150 mg Oral QHS Hildred Priest, MD   150 mg at 04/27/16 2140     Current Meds Prescriptions Prior to Admission  Medication Sig Dispense Refill Last Dose  . ARIPiprazole (ABILIFY) 5 MG tablet Take 1 tablet (5 mg total) by mouth daily. (Patient not taking: Reported on  04/24/2016) 30 tablet 0 Not Taking at Unknown time  . FLUoxetine (PROZAC) 40 MG capsule Take 1 capsule (40 mg total) by mouth daily. 30 capsule 0 Past Month at Unknown time  . gemfibrozil (LOPID) 600 MG tablet Take 1 tablet (600 mg total) by mouth 2 (two) times daily before a meal. (Patient not taking: Reported on 04/24/2016) 60 tablet 0 Not Taking at Unknown time  . hydrOXYzine (ATARAX/VISTARIL) 25 MG tablet Take 1 tablet (25 mg total) by mouth 3 (three) times daily as needed (agitation). (Patient not taking: Reported on 04/24/2016) 30 tablet 0 Not Taking at Unknown time  . nicotine (NICODERM CQ - DOSED IN MG/24 HOURS) 21 mg/24hr patch Place 1 patch (21 mg total) onto the skin daily. (Patient not taking: Reported on 04/24/2016) 28 patch 0 Not Taking at Unknown time  . traZODone (DESYREL) 100 MG tablet Take 1 tablet (100 mg total) by mouth at bedtime. 30 tablet 0 Past Month at Unknown time     Prior to Admission Meds   Interdisciplinary Treatment and Diagnostic Plan Update  04/28/2016 Time of Session: 11:32 AM  Brian Nolan MRN: [id number]  Principal Diagnosis: MDD (major depressive disorder), recurrent episode, moderate ([place])  Secondary Diagnoses: Principal Problem:   MDD (major depressive disorder), recurrent episode, moderate ([place]) Active Problems:   Cocaine use disorder, mild, abuse   Cannabis use disorder, mild, abuse   Hyperlipidemia   Alcohol use disorder, severe,  dependence ([place])   Tobacco use disorder   Current Medications:  Current Facility-Administered Medications  Medication Dose Route Frequency Provider Last Rate Last Dose  . acetaminophen (TYLENOL) tablet 650 mg  650 mg Oral Q6H PRN Hildred Priest, MD      . alum & mag hydroxide-simeth (MAALOX/MYLANTA) 200-200-20 MG/5ML suspension 30 mL  30 mL Oral Q4H PRN Hildred Priest, MD      . feeding supplement (ENSURE ENLIVE) (ENSURE ENLIVE) liquid 237 mL  237 mL Oral BID BM Hildred Priest, MD   237 mL  at 04/28/16 1000  . FLUoxetine (PROZAC) capsule 20 mg  20 mg Oral Daily Hildred Priest, MD   20 mg at 04/28/16 0756  . gemfibrozil (LOPID) tablet 600 mg  600 mg Oral BID AC Hildred Priest, MD   600 mg at 04/28/16 0756  . hydrOXYzine (ATARAX/VISTARIL) tablet 25 mg  25 mg Oral TID PRN Hildred Priest, MD   25 mg at 04/27/16 2141  . ibuprofen (ADVIL,MOTRIN) tablet 600 mg  600 mg Oral Q8H PRN Hildred Priest, MD      . magnesium hydroxide (MILK OF MAGNESIA) suspension 30 mL  30 mL Oral Daily PRN Hildred Priest, MD      . nicotine (NICODERM CQ - dosed in mg/24 hours) patch 21 mg  21 mg Transdermal Daily Hildred Priest, MD   21 mg at 04/28/16 0756  . traZODone (DESYREL) tablet 150 mg  150 mg Oral QHS Hildred Priest, MD   150 mg at 04/27/16 2140    PTA Medications: Prescriptions Prior to Admission  Medication Sig Dispense Refill Last Dose  . ARIPiprazole (ABILIFY) 5 MG tablet Take 1 tablet (5 mg total) by mouth daily. (Patient not taking: Reported on 04/24/2016) 30 tablet 0 Not Taking at Unknown time  . FLUoxetine (PROZAC) 40 MG capsule Take 1 capsule (40 mg total) by mouth daily. 30 capsule 0 Past Month at Unknown time  . gemfibrozil (LOPID) 600 MG tablet Take 1 tablet (600 mg total) by mouth 2 (two) times daily before a meal. (Patient not taking: Reported on 04/24/2016) 60 tablet 0 Not Taking at Unknown time  . hydrOXYzine (ATARAX/VISTARIL) 25 MG tablet Take 1 tablet (25 mg total) by mouth 3 (three) times daily as needed (agitation). (Patient not taking: Reported on 04/24/2016) 30 tablet 0 Not Taking at Unknown time  . nicotine (NICODERM CQ - DOSED IN MG/24 HOURS) 21 mg/24hr patch Place 1 patch (21 mg total) onto the skin daily. (Patient not taking: Reported on 04/24/2016) 28 patch 0 Not Taking at Unknown time  . traZODone (DESYREL) 100 MG tablet Take 1 tablet (100 mg total) by mouth at bedtime. 30 tablet 0 Past Month at Unknown time     Treatment Modalities: Medication Management, Group therapy, Case management,  1 to 1 session with clinician, Psychoeducation, Recreational therapy.   Physician Treatment Plan for Primary Diagnosis: MDD (major depressive disorder), recurrent episode, moderate ([facility]) Long Term Goal(s): Improvement in symptoms so as ready for discharge  Short Term Goals: Ability to verbalize feelings will improve and Ability to disclose and discuss suicidal ideas  Medication Management: Evaluate patient's response, side effects, and tolerance of medication regimen.  Therapeutic Interventions: 1 to 1 sessions, Unit Group sessions and Medication administration.  Evaluation of Outcomes: Met  Physician Treatment Plan for Secondary Diagnosis: Principal Problem:   MDD (major depressive disorder), recurrent episode, moderate ([facility]) Active Problems:   Cocaine use disorder, mild, abuse   Cannabis use disorder, mild, abuse   Hyperlipidemia  Alcohol use disorder, severe, dependence ([place])   Tobacco use disorder   Long Term Goal(s): Improvement in symptoms so as ready for discharge  Short Term Goals: Ability to identify changes in lifestyle to reduce recurrence of condition will improve and Ability to identify triggers associated with substance abuse/mental health issues will improve  Medication Management: Evaluate patient's response, side effects, and tolerance of medication regimen.  Therapeutic Interventions: 1 to 1 sessions, Unit Group sessions and Medication administration.  Evaluation of Outcomes: Met   RN Treatment Plan for Primary Diagnosis: MDD (major depressive disorder), recurrent episode, moderate ([facility]) Long Term Goal(s): Knowledge of disease and therapeutic regimen to maintain health will improve  Short Term Goals: Ability to demonstrate self-control and Ability to participate in decision making will improve  Medication Management: RN will administer medications as ordered by provider,  will assess and evaluate patient's response and provide education to patient for prescribed medication. RN will report any adverse and/or side effects to prescribing provider.  Therapeutic Interventions: 1 on 1 counseling sessions, Psychoeducation, Medication administration, Evaluate responses to treatment, Monitor vital signs and CBGs as ordered, Perform/monitor CIWA, COWS, AIMS and Fall Risk screenings as ordered, Perform wound care treatments as ordered.  Evaluation of Outcomes: Met   LCSW Treatment Plan for Primary Diagnosis: MDD (major depressive disorder), recurrent episode, moderate ([place]) Long Term Goal(s): Safe transition to appropriate next level of care at discharge, Engage patient in therapeutic group addressing interpersonal concerns.  Short Term Goals: Engage patient in aftercare planning with referrals and resources, Increase social support and Facilitate acceptance of mental health diagnosis and concerns  Therapeutic Interventions: Assess for all discharge needs, 1 to 1 time with Social worker, Explore available resources and support systems, Assess for adequacy in community support network, Educate family and significant other(s) on suicide prevention, Complete Psychosocial Assessment, Interpersonal group therapy.  Evaluation of Outcomes: Met   Progress in Treatment: Attending groups: Yes Participating in groups: Yes Taking medication as prescribed: Yes, MD continues to assess for medication changes as needed Toleration medication: Yes, no side effects reported at this time Family/Significant other contact made:  Patient understands diagnosis:  Discussing patient identified problems/goals with staff: Yes Medical problems stabilized or resolved: Yes Denies suicidal/homicidal ideation:  Issues/concerns per patient self-inventory: None Other: N/A  New problem(s) identified: None identified at this time.   New Short Term/Long Term Goal(s): None identified at this time.    Discharge Plan or Barriers: see below  Reason for Continuation of Hospitalization: Anxiety Delusions  Depression Hallucinations Homicidal ideation [place] Medical Issues Medication stabilization Suicidal ideation Withdrawal symptoms  Estimated Length of Stay: 3-5 days  Attendees: Patient: 04/28/2016  11:32 AM  Physician: Merlyn Albert 04/28/2016  11:32 AM  Nursing: Floyde Parkins 04/28/2016  11:32 AM  RN Care Manager: 04/28/2016  11:32 AM  Social Worker: Glorious Peach 04/28/2016  11:32 AM  Recreational Therapist: Everitt Amber 04/28/2016  11:32 AM  Other:  04/28/2016  11:32 AM  Other:  04/28/2016  11:32 AM  Other: 04/28/2016  11:32 AM    Scribe for Treatment Team: Glorious Peach, MSW, LCSW-A

## 2016-04-28 NOTE — Progress Notes (Signed)
D: Patient is alert and oriented on the unit this shift. Patient attended and actively participated in groups today. Patient denies suicidal ideation, homicidal ideation, auditory or visual hallucinations at the present time.  A: Scheduled medications are administered to patient as per MD orders. Emotional support and encouragement are provided. Patient is maintained on q.15 minute safety checks. Patient is informed to notify staff with questions or concerns. R: No adverse medication reactions are noted. Patient is cooperative with medication administration and treatment plan today. Patient is receptive, anxious and cooperative on the unit at this time. Patient isolates  on the unit this shift. Patient contracts for safety at this time. Patient remains safe at this time.

## 2016-04-28 NOTE — Plan of Care (Signed)
Problem: Coping: Goal: Ability to verbalize frustrations and anger appropriately will improve Outcome: Progressing Patient more verbal about feelings this shift [place] RN

## 2016-04-28 NOTE — Plan of Care (Signed)
Problem: [facility] Participation in Recreation Therapeutic Interventions Goal: STG-Patient will identify at least five coping skills for ** STG: Coping Skills - Within 4 treatment sessions, patient will verbalize at least 5 coping skills for substance abuse in each of 2 treatment sessions to decrease substance abuse post d/c.  Outcome: Completed/Met Date Met: 04/28/16 Treatment Session 2; Completed 2 out of 2: At approximately 11:20 am, LRT met with patient in patient room. Patient verbalized 5 coping skills for substance abuse. LRT encouraged patient to use his coping skills to help him avoid using substances.  Leonette Monarch, LRT/CTRS 08.28.17 12:05 pm Goal: STG-Other Recreation Therapy Goal (Specify) STG: Stress Management - Within 4 treatment sessions, patient will verbalize understanding of the stress management techniques in each of 2 treatment sessions to increase stress management skills post d/c.  Outcome: Completed/Met Date Met: 04/28/16 Treatment Session 2; Completed 2 out of 2: At approximately 11:20 am, LRT met with patient in patient room. Patient reported he read over and practiced the stress management techniques. Patient verbalized understanding and reported the techniques were helpful. LRT encouraged patient to continue practicing the stress management techniques.  Leonette Monarch, LRT/CTRS 08.28.17 12:06 pm

## 2016-05-08 ENCOUNTER — Telehealth: Payer: Self-pay

## 2016-05-08 NOTE — Telephone Encounter (Signed)
APT. REMINDER CALL, NO ANSWER, NO VOICEMAIL °

## 2016-05-09 ENCOUNTER — Ambulatory Visit: Payer: Self-pay

## 2016-06-12 ENCOUNTER — Emergency Department (HOSPITAL_COMMUNITY)
Admission: EM | Admit: 2016-06-12 | Discharge: 2016-06-12 | Disposition: A | Discharge status: Home / Self Care | Payer: Self-pay | Attending: Emergency Medicine | Admitting: Emergency Medicine

## 2016-06-12 ENCOUNTER — Emergency Department (HOSPITAL_COMMUNITY): Payer: Self-pay

## 2016-06-12 ENCOUNTER — Encounter (HOSPITAL_COMMUNITY): Payer: Self-pay | Admitting: Emergency Medicine

## 2016-06-12 DIAGNOSIS — Y939 Activity, unspecified: Secondary | ICD-10-CM | POA: Insufficient documentation

## 2016-06-12 DIAGNOSIS — F17210 Nicotine dependence, cigarettes, uncomplicated: Secondary | ICD-10-CM | POA: Insufficient documentation

## 2016-06-12 DIAGNOSIS — Y999 Unspecified external cause status: Secondary | ICD-10-CM | POA: Insufficient documentation

## 2016-06-12 DIAGNOSIS — S0003XA Contusion of scalp, initial encounter: Secondary | ICD-10-CM | POA: Insufficient documentation

## 2016-06-12 DIAGNOSIS — T07XXXA Unspecified multiple injuries, initial encounter: Secondary | ICD-10-CM

## 2016-06-12 DIAGNOSIS — S0502XA Injury of conjunctiva and corneal abrasion without foreign body, left eye, initial encounter: Secondary | ICD-10-CM

## 2016-06-12 DIAGNOSIS — Y9289 Other specified places as the place of occurrence of the external cause: Secondary | ICD-10-CM | POA: Insufficient documentation

## 2016-06-12 DIAGNOSIS — S0083XA Contusion of other part of head, initial encounter: Secondary | ICD-10-CM | POA: Insufficient documentation

## 2016-06-12 MED ORDER — CIPROFLOXACIN HCL 0.3 % OP SOLN
1.0000 [drp] | OPHTHALMIC | Status: AC
  Administered 2016-06-12: 1 [drp] via OPHTHALMIC
  Filled 2016-06-12: qty 2.5

## 2016-06-12 MED ORDER — IBUPROFEN 800 MG PO TABS: 800.0000 mg | ORAL_TABLET | Freq: Three times a day (TID) | ORAL | 0 refills | Status: DC

## 2016-06-12 MED ORDER — FLUORESCEIN SODIUM 1 MG OP STRP
1.0000 | ORAL_STRIP | Freq: Once | OPHTHALMIC | Status: AC
  Administered 2016-06-12: 1 via OPHTHALMIC
  Filled 2016-06-12: qty 1

## 2016-06-12 MED ORDER — OXYCODONE-ACETAMINOPHEN 5-325 MG PO TABS
2.0000 | ORAL_TABLET | Freq: Once | ORAL | Status: AC
  Administered 2016-06-12: 2 via ORAL
  Filled 2016-06-12: qty 2

## 2016-06-12 MED ORDER — TETRACAINE HCL 0.5 % OP SOLN
2.0000 [drp] | Freq: Once | OPHTHALMIC | Status: AC
  Administered 2016-06-12: 2 [drp] via OPHTHALMIC
  Filled 2016-06-12: qty 2

## 2016-06-12 NOTE — Discharge Instructions (Signed)
Your x-rays reveal no signs of fractures of your ribs or your face. Your eye exam shows that you do have a scratch on your eye, he will need to be seen by GI specialist tomorrow. Please call in the morning for an appointment. We have given you an antibiotic drop, you should use this drop every 8 hours until you follow-up with the eye doctor tomorrow. Ibuprofen for pain ice pack for swelling

## 2016-06-12 NOTE — ED Notes (Signed)
Dr Miller at bedside. 
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
## 2016-06-12 NOTE — ED Notes (Signed)
Patient transported to X-ray 

## 2016-06-14 ENCOUNTER — Encounter (HOSPITAL_COMMUNITY): Payer: Self-pay | Admitting: Emergency Medicine

## 2016-06-14 ENCOUNTER — Emergency Department (HOSPITAL_COMMUNITY)
Admission: EM | Admit: 2016-06-14 | Discharge: 2016-06-15 | Disposition: A | Discharge status: Other Acute Inpatient Hospital | Payer: No Typology Code available for payment source | Attending: Emergency Medicine | Admitting: Emergency Medicine

## 2016-06-14 DIAGNOSIS — R45851 Suicidal ideations: Secondary | ICD-10-CM

## 2016-06-14 DIAGNOSIS — Z5181 Encounter for therapeutic drug level monitoring: Secondary | ICD-10-CM | POA: Insufficient documentation

## 2016-06-14 DIAGNOSIS — F332 Major depressive disorder, recurrent severe without psychotic features: Secondary | ICD-10-CM | POA: Insufficient documentation

## 2016-06-14 DIAGNOSIS — F17210 Nicotine dependence, cigarettes, uncomplicated: Secondary | ICD-10-CM | POA: Insufficient documentation

## 2016-06-14 LAB — COMPREHENSIVE METABOLIC PANEL
ALT: 21 U/L (ref 17–63)
AST: 30 U/L (ref 15–41)
Albumin: 4.2 g/dL (ref 3.5–5.0)
Alkaline Phosphatase: 62 U/L (ref 38–126)
Anion gap: 10 (ref 5–15)
BILIRUBIN TOTAL: 1 mg/dL (ref 0.3–1.2)
BUN: 11 mg/dL (ref 6–20)
CO2: 24 mmol/L (ref 22–32)
CREATININE: 0.99 mg/dL (ref 0.61–1.24)
Calcium: 9.6 mg/dL (ref 8.9–10.3)
Chloride: 106 mmol/L (ref 101–111)
GFR calc Af Amer: >60 mL/min (ref >60)
Glucose, Bld: 108 mg/dL — ABNORMAL HIGH (ref 65–99)
Potassium: 3.8 mmol/L (ref 3.5–5.1)
Sodium: 140 mmol/L (ref 135–145)
Total Protein: 7.4 g/dL (ref 6.5–8.1)

## 2016-06-14 LAB — RAPID URINE DRUG SCREEN, HOSP PERFORMED
AMPHETAMINES: NOT DETECTED
BENZODIAZEPINES: NOT DETECTED
Barbiturates: NOT DETECTED
COCAINE: POSITIVE — AB
Opiates: NOT DETECTED
Tetrahydrocannabinol: POSITIVE — AB

## 2016-06-14 LAB — CBC
HEMATOCRIT: 47.3 % (ref 39.0–52.0)
Hemoglobin: 16.2 g/dL (ref 13.0–17.0)
MCH: 31.5 pg (ref 26.0–34.0)
MCHC: 34.2 g/dL (ref 30.0–36.0)
MCV: 92 fL (ref 78.0–100.0)
PLATELETS: 220 10*3/uL (ref 150–400)
RBC: 5.14 MIL/uL (ref 4.22–5.81)
RDW: 14.3 % (ref 11.5–15.5)
WBC: 10.6 10*3/uL — AB (ref 4.0–10.5)

## 2016-06-14 LAB — ETHANOL

## 2016-06-14 LAB — ACETAMINOPHEN LEVEL

## 2016-06-14 LAB — SALICYLATE LEVEL: Salicylate Lvl: <7 mg/dL (ref 2.8–30.0)

## 2016-06-14 MED ORDER — NICOTINE 21 MG/24HR TD PT24
21.0000 mg | MEDICATED_PATCH | Freq: Every day | TRANSDERMAL | Status: DC
Start: 2016-06-14 — End: 2016-06-15
  Administered 2016-06-14 – 2016-06-15 (×2): 21 mg via TRANSDERMAL
  Filled 2016-06-14: qty 1

## 2016-06-14 MED ORDER — TRAZODONE HCL 50 MG PO TABS
150.0000 mg | ORAL_TABLET | Freq: Every day | ORAL | Status: DC
  Administered 2016-06-14: 150 mg via ORAL
  Filled 2016-06-14: qty 1

## 2016-06-14 MED ORDER — GEMFIBROZIL 600 MG PO TABS
600.0000 mg | ORAL_TABLET | Freq: Two times a day (BID) | ORAL | Status: DC
  Administered 2016-06-15: 600 mg via ORAL
  Filled 2016-06-14 (×2): qty 1

## 2016-06-14 MED ORDER — FLUOXETINE HCL 20 MG PO CAPS
20.0000 mg | ORAL_CAPSULE | Freq: Every day | ORAL | Status: DC
  Administered 2016-06-14 – 2016-06-15 (×2): 20 mg via ORAL
  Filled 2016-06-14 (×2): qty 1

## 2016-06-14 MED ORDER — IBUPROFEN 200 MG PO TABS
600.0000 mg | ORAL_TABLET | Freq: Three times a day (TID) | ORAL | Status: DC | PRN
  Administered 2016-06-14 – 2016-06-15 (×2): 600 mg via ORAL
  Filled 2016-06-14 (×2): qty 3

## 2016-06-14 NOTE — ED Notes (Signed)
Patient noted sleeping in room. No complaints, stable, in no acute distress. Q15 minute rounds and monitoring via Security Cameras to continue.  

## 2016-06-14 NOTE — ED Provider Notes (Signed)
[facility]-EMERGENCY DEPT Provider Note   CSN: [id number] Arrival date & time: 06/14/16  1555     History   Chief Complaint Chief Complaint  Patient presents with  . Suicidal    HPI Brian Nolan is a 35 y.o. male with a past medical history of schizophrenia, substance abuse who presents the ED today complaining of suicidal ideation. Patient states that he was walking along the bridge on [address] and contemplating jumping off in an attempt to harm himself. Pt states that he is struggling with drug and alcohol addiction and "no longer want to live this way anymore". Patient states his last crack cocaine use was yesterday. Patient also reports drinking several 40 ounce appears yesterday. He denies any homicidal ideation, hallucinations. Patient states "I will slit my throat right now ff I dont' get some help".  HPI  Past Medical History:  Diagnosis Date  . Depression   . Schizophrenia (HCC)   . Substance abuse     Patient Active Problem List   Diagnosis Date Noted  . Alcohol use disorder, severe, dependence (HCC) 04/25/2016  . Tobacco use disorder 04/25/2016  . Hyperlipidemia 10/12/2015  . MDD (major depressive disorder), recurrent episode, moderate (HCC) 10/11/2015  . Cocaine use disorder, mild, abuse 10/11/2015  . Cannabis use disorder, mild, abuse 10/11/2015    Past Surgical History:  Procedure Laterality Date  . NO PAST SURGERIES         Home Medications    Prior to Admission medications   Medication Sig Start Date End Date Taking? Authorizing Provider  FLUoxetine (PROZAC) 20 MG capsule Take 1 capsule (20 mg total) by mouth daily. 04/27/16   Jimmy Footman, MD  gemfibrozil (LOPID) 600 MG tablet Take 1 tablet (600 mg total) by mouth 2 (two) times daily before a meal. 04/27/16   Jimmy Footman, MD  ibuprofen (ADVIL,MOTRIN) 800 MG tablet Take 1 tablet (800 mg total) by mouth 3 (three) times daily. 06/12/16   Eber Hong, MD  traZODone (DESYREL) 150  MG tablet Take 1 tablet (150 mg total) by mouth at bedtime. 04/27/16   Jimmy Footman, MD    Family History Family History  Problem Relation Age of Onset  . Schizophrenia Maternal Uncle     Social History Social History  Substance Use Topics  . Smoking status: Current Every Day Smoker    Packs/day: 1.00    Types: Cigarettes  . Smokeless tobacco: Never Used  . Alcohol use Yes     Allergies   Review of patient's allergies indicates no known allergies.   Review of Systems Review of Systems  All other systems reviewed and are negative.    Physical Exam Updated Vital Signs BP 125/77   Pulse 110   Temp 98.2 F (36.8 C)   Resp 18   SpO2 95%   Physical Exam  Constitutional: He is oriented to person, place, and time. He appears well-developed and well-nourished. No distress.  HENT:  Head: Normocephalic and atraumatic.  Eyes: Conjunctivae are normal. Right eye exhibits no discharge. Left eye exhibits no discharge. No scleral icterus.  Cardiovascular: Normal rate.   Pulmonary/Chest: Effort normal.  Neurological: He is alert and oriented to person, place, and time. Coordination normal.  Skin: Skin is warm and dry. No rash noted. He is not diaphoretic. No erythema. No pallor.  Psychiatric:  SI  Nursing note and vitals reviewed.    ED Treatments / Results  Labs (all labs ordered are listed, but only abnormal results are displayed) Labs Reviewed  COMPREHENSIVE METABOLIC PANEL - Abnormal; Notable for the following:       Result Value   Glucose, Bld 108 (*)    All other components within normal limits  ACETAMINOPHEN LEVEL - Abnormal; Notable for the following:    Acetaminophen (Tylenol), Serum <10 (*)    All other components within normal limits  CBC - Abnormal; Notable for the following:    WBC 10.6 (*)    All other components within normal limits  RAPID URINE DRUG SCREEN, HOSP PERFORMED - Abnormal; Notable for the following:    Cocaine POSITIVE (*)     Tetrahydrocannabinol POSITIVE (*)    All other components within normal limits  ETHANOL  SALICYLATE LEVEL    EKG  EKG Interpretation None       Radiology No results found.  Procedures Procedures (including critical care time)  Medications Ordered in ED Medications  ibuprofen (ADVIL,MOTRIN) tablet 600 mg (not administered)  FLUoxetine (PROZAC) capsule 20 mg (not administered)  gemfibrozil (LOPID) tablet 600 mg (not administered)  traZODone (DESYREL) tablet 150 mg (not administered)     Initial Impression / Assessment and Plan / ED Course  I have reviewed the triage vital signs and the nursing notes.  Pertinent labs & imaging results that were available during my care of the patient were reviewed by me and considered in my medical decision making (see chart for details).  Clinical Course   35 year old male with multiple psychiatric disorders and polysubstance abuse presents the ED stay complaining of suicidal ideation. Labs unremarkable. Patient is medically cleared and awaiting TTS consult and disposition.  Final Clinical Impressions(s) / ED Diagnoses   Final diagnoses:  Suicidal ideation    New Prescriptions New Prescriptions   No medications on file     Dub MikesSamantha Tripp Dowless, PA-C 06/14/16 1739    Jacalyn LefevreJulie Haviland, MD 06/14/16 2333

## 2016-06-14 NOTE — ED Notes (Signed)
Report to include situation, background, assessment and recommendations from Janie Rambo RN. Patient sleeping, respirations regular and unlabored. Q15 minute rounds and security camera observation to continue.   

## 2016-06-14 NOTE — ED Notes (Signed)
Bed: [facility] Expected date:  Expected time:  Means of arrival:  Comments: Medical Clearance 

## 2016-06-14 NOTE — ED Triage Notes (Signed)
Pt reports SI. Was walking [place] and [place], thinking about SI. Pt has hx of etoh abuse. Pt reports last use yesterday. Also used crack yesterday as well. Pt calm in triage.

## 2016-06-14 NOTE — ED Notes (Signed)
Bed: [id number] Expected date:  Expected time:  Means of arrival:  Comments: Triage 4  

## 2016-06-14 NOTE — ED Notes (Signed)
Patient noted in room. No complaints, stable, in no acute distress. Q15 minute rounds and monitoring via Security Cameras to continue.  
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
## 2016-06-14 NOTE — ED Notes (Signed)
Pt has been wanded by security. 

## 2016-06-14 NOTE — ED Notes (Signed)
Up to the bathroom 

## 2016-06-14 NOTE — ED Notes (Signed)
Pt ambulatory w/o difficulty to unit, scrubs searched on arrival

## 2016-06-15 ENCOUNTER — Encounter (HOSPITAL_COMMUNITY): Payer: Self-pay | Admitting: General Practice

## 2016-06-15 ENCOUNTER — Inpatient Hospital Stay (HOSPITAL_COMMUNITY)
Admission: AD | Admit: 2016-06-15 | Discharge: 2016-06-19 | DRG: 897 | Payer: Federal, State, Local not specified - Other | Source: Intra-hospital | Attending: Psychiatry | Admitting: Psychiatry

## 2016-06-15 DIAGNOSIS — F17210 Nicotine dependence, cigarettes, uncomplicated: Secondary | ICD-10-CM | POA: Diagnosis present

## 2016-06-15 DIAGNOSIS — Z818 Family history of other mental and behavioral disorders: Secondary | ICD-10-CM

## 2016-06-15 DIAGNOSIS — F1420 Cocaine dependence, uncomplicated: Principal | ICD-10-CM | POA: Diagnosis present

## 2016-06-15 DIAGNOSIS — F332 Major depressive disorder, recurrent severe without psychotic features: Secondary | ICD-10-CM | POA: Diagnosis not present

## 2016-06-15 DIAGNOSIS — R45851 Suicidal ideations: Secondary | ICD-10-CM | POA: Diagnosis present

## 2016-06-15 DIAGNOSIS — F1024 Alcohol dependence with alcohol-induced mood disorder: Secondary | ICD-10-CM | POA: Diagnosis not present

## 2016-06-15 DIAGNOSIS — Z79899 Other long term (current) drug therapy: Secondary | ICD-10-CM | POA: Diagnosis not present

## 2016-06-15 HISTORY — DX: Other specified health status: Z78.9

## 2016-06-15 MED ORDER — THIAMINE HCL 100 MG/ML IJ SOLN: 100.0000 mg | Freq: Once | INTRAMUSCULAR | Status: DC

## 2016-06-15 MED ORDER — LOPERAMIDE HCL 2 MG PO CAPS: 2.0000 mg | ORAL_CAPSULE | ORAL | Status: AC | PRN

## 2016-06-15 MED ORDER — TRAZODONE HCL 150 MG PO TABS
150.0000 mg | ORAL_TABLET | Freq: Every day | ORAL | Status: DC
  Administered 2016-06-15 – 2016-06-16 (×2): 150 mg via ORAL
  Filled 2016-06-15 (×4): qty 1

## 2016-06-15 MED ORDER — FLUOXETINE HCL 20 MG PO CAPS
20.0000 mg | ORAL_CAPSULE | Freq: Every day | ORAL | Status: DC
  Administered 2016-06-16 – 2016-06-18 (×3): 20 mg via ORAL
  Filled 2016-06-15: qty 14
  Filled 2016-06-15 (×2): qty 1
  Filled 2016-06-15 (×2): qty 14
  Filled 2016-06-15: qty 1

## 2016-06-15 MED ORDER — VITAMIN B-1 100 MG PO TABS: 100.0000 mg | ORAL_TABLET | Freq: Every day | ORAL | Status: DC

## 2016-06-15 MED ORDER — IBUPROFEN 600 MG PO TABS
600.0000 mg | ORAL_TABLET | Freq: Three times a day (TID) | ORAL | Status: DC | PRN
  Administered 2016-06-15 – 2016-06-17 (×3): 600 mg via ORAL
  Filled 2016-06-15 (×4): qty 1

## 2016-06-15 MED ORDER — ONDANSETRON 4 MG PO TBDP: 4.0000 mg | ORAL_TABLET | Freq: Four times a day (QID) | ORAL | Status: AC | PRN

## 2016-06-15 MED ORDER — LOPERAMIDE HCL 2 MG PO CAPS: 2.0000 mg | ORAL_CAPSULE | ORAL | Status: DC | PRN

## 2016-06-15 MED ORDER — ADULT MULTIVITAMIN W/MINERALS CH
1.0000 | ORAL_TABLET | Freq: Every day | ORAL | Status: DC
  Administered 2016-06-16 – 2016-06-18 (×3): 1 via ORAL
  Filled 2016-06-15 (×5): qty 1

## 2016-06-15 MED ORDER — HYDROXYZINE HCL 25 MG PO TABS
25.0000 mg | ORAL_TABLET | Freq: Four times a day (QID) | ORAL | Status: AC | PRN
  Administered 2016-06-15 – 2016-06-16 (×2): 25 mg via ORAL
  Filled 2016-06-15: qty 1
  Filled 2016-06-15: qty 20
  Filled 2016-06-15 (×2): qty 1

## 2016-06-15 MED ORDER — ADULT MULTIVITAMIN W/MINERALS CH
1.0000 | ORAL_TABLET | Freq: Every day | ORAL | Status: DC
  Administered 2016-06-15: 1 via ORAL
  Filled 2016-06-15: qty 1

## 2016-06-15 MED ORDER — THIAMINE HCL 100 MG/ML IJ SOLN
100.0000 mg | Freq: Once | INTRAMUSCULAR | Status: AC
  Administered 2016-06-15: 100 mg via INTRAMUSCULAR
  Filled 2016-06-15: qty 2

## 2016-06-15 MED ORDER — ONDANSETRON 4 MG PO TBDP: 4.0000 mg | ORAL_TABLET | Freq: Four times a day (QID) | ORAL | Status: DC | PRN

## 2016-06-15 MED ORDER — NICOTINE 21 MG/24HR TD PT24
21.0000 mg | MEDICATED_PATCH | Freq: Every day | TRANSDERMAL | Status: DC
  Administered 2016-06-16 – 2016-06-18 (×3): 21 mg via TRANSDERMAL
  Filled 2016-06-15 (×5): qty 1

## 2016-06-15 MED ORDER — GEMFIBROZIL 600 MG PO TABS
600.0000 mg | ORAL_TABLET | Freq: Two times a day (BID) | ORAL | Status: DC
  Administered 2016-06-16 – 2016-06-19 (×7): 600 mg via ORAL
  Filled 2016-06-15: qty 28
  Filled 2016-06-15: qty 1
  Filled 2016-06-15: qty 28
  Filled 2016-06-15 (×2): qty 1
  Filled 2016-06-15 (×2): qty 28
  Filled 2016-06-15 (×4): qty 1

## 2016-06-15 MED ORDER — VITAMIN B-1 100 MG PO TABS
100.0000 mg | ORAL_TABLET | Freq: Every day | ORAL | Status: DC
  Administered 2016-06-16 – 2016-06-18 (×3): 100 mg via ORAL
  Filled 2016-06-15 (×2): qty 1
  Filled 2016-06-15 (×2): qty 14
  Filled 2016-06-15: qty 1

## 2016-06-15 MED ORDER — LORAZEPAM 1 MG PO TABS: 1.0000 mg | ORAL_TABLET | Freq: Four times a day (QID) | ORAL | Status: DC | PRN

## 2016-06-15 MED ORDER — ENSURE ENLIVE PO LIQD: 237.0000 mL | Freq: Two times a day (BID) | ORAL | Status: DC

## 2016-06-15 MED ORDER — LORAZEPAM 1 MG PO TABS
1.0000 mg | ORAL_TABLET | Freq: Four times a day (QID) | ORAL | Status: AC | PRN
  Administered 2016-06-15: 1 mg via ORAL
  Filled 2016-06-15: qty 1

## 2016-06-15 MED ORDER — HYDROXYZINE HCL 25 MG PO TABS: 25.0000 mg | ORAL_TABLET | Freq: Four times a day (QID) | ORAL | Status: DC | PRN

## 2016-06-15 NOTE — ED Notes (Signed)
Patient noted sleeping in room. No complaints, stable, in no acute distress. Q15 minute rounds and monitoring via Security Cameras to continue.  

## 2016-06-15 NOTE — ED Notes (Signed)
Up to the bathroom 

## 2016-06-15 NOTE — Tx Team (Signed)
Initial Treatment Plan 06/15/2016 4:47 PM Brian GrandJohn Tauzin ZOX:[id number]    PATIENT STRESSORS: Substance abuse   PATIENT STRENGTHS: Ability for insight Capable of independent living Motivation for treatment/growth   PATIENT IDENTIFIED PROBLEMS: Depression  Suicidal Ideation  " Enter substance abuse treatment program"  " Get out of unhealthy and unsafe situation"                DISCHARGE CRITERIA:  Adequate post-discharge living arrangements  PRELIMINARY DISCHARGE PLAN: Attend 12-step recovery group Placement in alternative living arrangements  PATIENT/FAMILY INVOLVEMENT: This treatment plan has been presented to and reviewed with the patient, Brian Nolan.  The patient and family have been given the opportunity to ask questions and make suggestions.  Vinetta BergamoBarbara M Ercia Crisafulli, RN 06/15/2016, 4:47 PM

## 2016-06-15 NOTE — ED Notes (Signed)
Ice pk given. 
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
## 2016-06-15 NOTE — BH Assessment (Signed)
[facility]  [facility]16[id number]

## 2016-06-15 NOTE — Progress Notes (Signed)
Patient attended AA group meeting.  

## 2016-06-15 NOTE — ED Notes (Signed)
TTS into see 

## 2016-06-15 NOTE — Consult Note (Signed)
[place] Psychiatry Consult   Reason for Consult:  Suicidal ideations Referring Physician:  ED Physician Patient Identification: Brian Nolan MRN:  [id number] Principal Diagnosis:  Cocaine Abuse, Alcohol Abuse, Substance Induced Mood Disorder  Diagnosis:  Substance Induced Mood Disorder  Patient Active Problem List   Diagnosis Date Noted  . Alcohol use disorder, severe, dependence ([place]) [F10.20] 04/25/2016  . Tobacco use disorder [F17.200] 04/25/2016  . Hyperlipidemia [E78.5] 10/12/2015  . MDD (major depressive disorder), recurrent episode, moderate ([place]) [F33.1] 10/11/2015  . Cocaine use disorder, mild, abuse [F14.10] 10/11/2015  . Cannabis use disorder, mild, abuse [F12.10] 10/11/2015    Total Time spent with patient: 30 minutes  Subjective:   Brian Nolan is a 35 y.o. male patient admitted with suicidal ideations.  HPI:  34 year old male, reports  Worsening depression,recent suicidal ideations, with thoughts of jumping off a bridge. States he was walking on a bridge at [employer], contemplating suicide. States police were alerted and brought him to ED . Patient states he has been feeling depressed, and endorses neuro-vegetative symptoms of depression, such as poor appetite, poor sleep, anhedonia, low energy. Patient states his worsening depression is at least partially related to substance abuse. States " I have an addiction and it has gotten worse". States that his addiction is discouraging and " it makes me want to give up, I would rather die". Patient reports abusing cocaine and alcohol. He had been drinking up to a case of beer per day. Last drank 2 days ago. At this time presenting with slight tremors, restlessness, but not in any acute distress . Denies hallucinations at this time and does not appear internally preoccupied . Patient has not been taking psychiatric medications recently. UDS positive for cocaine and cannabis . BAL < 5   Past Psychiatric History:  history of prior psychiatric admissions, most recently in August/17, for suicidality, depression, substance abuse . At that time discharged on Prozac 20 mgrs QDAY andTrazodone 150 mgrs QHS   Risk to Self: Suicidal Ideation: Yes-Currently Present Suicidal Intent: Yes-Currently Present Is patient at risk for suicide?: Yes Suicidal Plan?: Yes-Currently Present Specify Current Suicidal Plan: Jump off of bridge  Access to Means: Yes Specify Access to Suicidal Means: access to bridge  What has been your use of drugs/alcohol within the last 12 months?: Daily alcohol, crack/cocaine and THC use reported.  How many times?: 1 Other Self Harm Risks: cutting  Triggers for Past Attempts: Other (Comment) (my uncle has stage 4 cancer (3 to 4 weeks to live)) Intentional Self Injurious Behavior: Cutting Comment - Self Injurious Behavior: cutting  Risk to Others: Homicidal Ideation: No Thoughts of Harm to Others: No Current Homicidal Intent: No Current Homicidal Plan: No Access to Homicidal Means: No Identified Victim: N/A History of harm to others?: No Assessment of Violence: None Noted Violent Behavior Description: No violent behaviors observed. Pt is calm and cooperative at this time.  Does patient have access to weapons?: No Criminal Charges Pending?: Yes Describe Pending Criminal Charges: Driving while impaired, injury to personal property, assault on a male, misdemeanor larceny   Does patient have a court date: Yes Court Date: 06/24/16 Prior Inpatient Therapy: Prior Inpatient Therapy: Yes Prior Therapy Dates: 08/14/15, 10/10/15, 8/17 Prior Therapy Facilty/Provider(s): [facility] [place]  Reason for Treatment: Depression, SI  Prior Outpatient Therapy: Prior Outpatient Therapy: No Does patient have an ACCT team?: No Does patient have Intensive In-House Services?  : No Does patient have [facility] services? : No Does patient have P4CC  services?: No  Past Medical History:  Past Medical History:   Diagnosis Date  . Depression   . Schizophrenia ([place])   . Substance abuse     Past Surgical History:  Procedure Laterality Date  . NO PAST SURGERIES     Family History: Family History  Problem Relation Age of Onset  . Schizophrenia Maternal Uncle    Family Psychiatric  History: as above  Social History: single, has two children, who live with the mother, employed, no legal issues  History  Alcohol Use  . Yes     History  Drug Use  . Types: "Crack" cocaine, Marijuana    Social History   Social History  . Marital status: Single    Spouse name: N/A  . Number of children: N/A  . Years of education: N/A   Social History Main Topics  . Smoking status: Current Every Day Smoker    Packs/day: 1.00    Types: Cigarettes  . Smokeless tobacco: Never Used  . Alcohol use Yes  . Drug use:     Types: "Crack" cocaine, Marijuana  . Sexual activity: Yes    Birth control/ protection: Condom   Other Topics Concern  . None   Social History Narrative  . None   Additional Social History:    Allergies:  No Known Allergies  Labs:  Results for orders placed or performed during the hospital encounter of 06/14/16 (from the past 48 hour(s))  Rapid urine drug screen (hospital performed)     Status: Abnormal   Collection Time: 06/14/16  4:01 PM  Result Value Ref Range   Opiates NONE DETECTED NONE DETECTED   Cocaine POSITIVE (A) NONE DETECTED   Benzodiazepines NONE DETECTED NONE DETECTED   Amphetamines NONE DETECTED NONE DETECTED   Tetrahydrocannabinol POSITIVE (A) NONE DETECTED   Barbiturates NONE DETECTED NONE DETECTED    Comment:        DRUG SCREEN FOR MEDICAL PURPOSES ONLY.  IF CONFIRMATION IS NEEDED FOR ANY PURPOSE, NOTIFY LAB WITHIN 5 DAYS.        LOWEST DETECTABLE LIMITS FOR URINE DRUG SCREEN Drug Class       Cutoff (ng/mL) Amphetamine      1000 Barbiturate      200 Benzodiazepine   810 Tricyclics       175 Opiates          300 Cocaine          300 THC               50   Comprehensive metabolic panel     Status: Abnormal   Collection Time: 06/14/16  4:05 PM  Result Value Ref Range   Sodium 140 135 - 145 mmol/L   Potassium 3.8 3.5 - 5.1 mmol/L   Chloride 106 101 - 111 mmol/L   CO2 24 22 - 32 mmol/L   Glucose, Bld 108 (H) 65 - 99 mg/dL   BUN 11 6 - 20 mg/dL   Creatinine, Ser 0.99 0.61 - 1.24 mg/dL   Calcium 9.6 8.9 - 10.3 mg/dL   Total Protein 7.4 6.5 - 8.1 g/dL   Albumin 4.2 3.5 - 5.0 g/dL   AST 30 15 - 41 U/L   ALT 21 17 - 63 U/L   Alkaline Phosphatase 62 38 - 126 U/L   Total Bilirubin 1.0 0.3 - 1.2 mg/dL   GFR calc non Af Amer >60 >60 mL/min   GFR calc Af Amer >60 >60 mL/min    Comment: (  NOTE) The eGFR has been calculated using the CKD EPI equation. This calculation has not been validated in all clinical situations. eGFR's persistently <60 mL/min signify possible Chronic Kidney Disease.    Anion gap 10 5 - 15  Ethanol     Status: None   Collection Time: 06/14/16  4:05 PM  Result Value Ref Range   Alcohol, Ethyl (B) <5 <5 mg/dL    Comment:        LOWEST DETECTABLE LIMIT FOR SERUM ALCOHOL IS 5 mg/dL FOR MEDICAL PURPOSES ONLY   Salicylate level     Status: None   Collection Time: 06/14/16  4:05 PM  Result Value Ref Range   Salicylate Lvl <0.6 2.8 - 30.0 mg/dL  Acetaminophen level     Status: Abnormal   Collection Time: 06/14/16  4:05 PM  Result Value Ref Range   Acetaminophen (Tylenol), Serum <10 (L) 10 - 30 ug/mL    Comment:        THERAPEUTIC CONCENTRATIONS VARY SIGNIFICANTLY. A RANGE OF 10-30 ug/mL MAY BE AN EFFECTIVE CONCENTRATION FOR MANY PATIENTS. HOWEVER, SOME ARE BEST TREATED AT CONCENTRATIONS OUTSIDE THIS RANGE. ACETAMINOPHEN CONCENTRATIONS >150 ug/mL AT 4 HOURS AFTER INGESTION AND >50 ug/mL AT 12 HOURS AFTER INGESTION ARE OFTEN ASSOCIATED WITH TOXIC REACTIONS.   cbc     Status: Abnormal   Collection Time: 06/14/16  4:05 PM  Result Value Ref Range   WBC 10.6 (H) 4.0 - 10.5 K/uL   RBC 5.14 4.22 - 5.81  MIL/uL   Hemoglobin 16.2 13.0 - 17.0 g/dL   HCT 47.3 39.0 - 52.0 %   MCV 92.0 78.0 - 100.0 fL   MCH 31.5 26.0 - 34.0 pg   MCHC 34.2 30.0 - 36.0 g/dL   RDW 14.3 11.5 - 15.5 %   Platelets 220 150 - 400 K/uL    Current Facility-Administered Medications  Medication Dose Route Frequency Provider Last Rate Last Dose  . FLUoxetine (PROZAC) capsule 20 mg  20 mg Oral Daily Samantha Tripp Dowless, PA-C   20 mg at 06/15/16 0933  . gemfibrozil (LOPID) tablet 600 mg  600 mg Oral BID AC Samantha Tripp Dowless, PA-C   600 mg at 06/15/16 2376  . ibuprofen (ADVIL,MOTRIN) tablet 600 mg  600 mg Oral Q8H PRN Samantha Tripp Dowless, PA-C   600 mg at 06/15/16 1022  . nicotine (NICODERM CQ - dosed in mg/24 hours) patch 21 mg  21 mg Transdermal Daily Isla Pence, MD   21 mg at 06/15/16 0934  . traZODone (DESYREL) tablet 150 mg  150 mg Oral QHS Samantha Tripp Dowless, PA-C   150 mg at 06/14/16 2129   Current Outpatient Prescriptions  Medication Sig Dispense Refill  . ENSURE (ENSURE) Take 237 mLs by mouth 2 (two) times daily between meals.    Marland Kitchen FLUoxetine (PROZAC) 20 MG capsule Take 1 capsule (20 mg total) by mouth daily. 30 capsule 0  . gemfibrozil (LOPID) 600 MG tablet Take 1 tablet (600 mg total) by mouth 2 (two) times daily before a meal. 60 tablet 0  . ibuprofen (ADVIL,MOTRIN) 800 MG tablet Take 1 tablet (800 mg total) by mouth 3 (three) times daily. 21 tablet 0  . nicotine (NICODERM CQ - DOSED IN MG/24 HOURS) 14 mg/24hr patch Place 14 mg onto the skin daily.    . traZODone (DESYREL) 150 MG tablet Take 1 tablet (150 mg total) by mouth at bedtime. 30 tablet 0    Musculoskeletal: Strength & Muscle Tone: within normal limits mild distal  tremors, but no restlessness or agitiation Gait & Station: normal Patient leans: N/A  Psychiatric Specialty Exam: Physical Exam  ROS denies headache , denies chest pain,no shortness of breath, no nausea, no vomiting   Blood pressure 117/72, pulse 82, temperature 98 F  (36.7 C), temperature source Oral, resp. rate 18, SpO2 100 %.There is no height or weight on file to calculate BMI.  General Appearance: Fairly Groomed  Eye Contact:  Good  Speech:  Normal Rate  Volume:  Normal  Mood:  Depressed  Affect:  constricted, but reactive   Thought Process:  Linear  Orientation:  Other:  fully alert and attentive   Thought Content:  denies hallucinations, no delusions expressed   Suicidal Thoughts:  at this time denies suicidal plan or intention and contracts for safety on unit   Homicidal Thoughts:  denies homicidal ideations   Memory:  recent and remote grossly intact   Judgement:  Fair  Insight:  Fair  Psychomotor Activity:  slight distal tremors, no restlessness   Concentration:  Concentration: Good and Attention Span: Good  Recall:  Good  Fund of Knowledge:  Good  Language:  Good  Akathisia:  Negative  Handed:  Right  AIMS (if indicated):     Assets:  Desire for Improvement Resilience  ADL's:  Intact  Cognition:  WNL  Sleep:        Treatment Plan Summary: Plan as below   Disposition: Recommend psychiatric Inpatient admission when medically cleared. Patient is requesting inpatient psychiatric admission, which is warranted at this time. Will start Ativan PRNs for potential alcohol withdrawal Has been restarted on Prozac, which he states was helping and well tolerated Neita Garnet, MD 06/15/2016 12:21 PM

## 2016-06-15 NOTE — ED Notes (Addendum)
[facility] is here to transport.  [facility], belongings given to driver.

## 2016-06-16 ENCOUNTER — Encounter (HOSPITAL_COMMUNITY): Payer: Self-pay | Admitting: Psychiatry

## 2016-06-16 DIAGNOSIS — Z79899 Other long term (current) drug therapy: Secondary | ICD-10-CM

## 2016-06-16 DIAGNOSIS — F332 Major depressive disorder, recurrent severe without psychotic features: Secondary | ICD-10-CM

## 2016-06-16 DIAGNOSIS — Z818 Family history of other mental and behavioral disorders: Secondary | ICD-10-CM

## 2016-06-16 DIAGNOSIS — F1420 Cocaine dependence, uncomplicated: Principal | ICD-10-CM

## 2016-06-16 DIAGNOSIS — R45851 Suicidal ideations: Secondary | ICD-10-CM

## 2016-06-16 MED ORDER — ENSURE ENLIVE PO LIQD
237.0000 mL | Freq: Two times a day (BID) | ORAL | Status: DC
  Administered 2016-06-16 – 2016-06-18 (×5): 237 mL via ORAL
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
## 2016-06-16 NOTE — Progress Notes (Signed)
NUTRITION ASSESSMENT  Pt identified as at risk on the Malnutrition Screen Tool  INTERVENTION: 1. Supplements: Ensure Enlive po BID, each supplement provides 350 kcal and 20 grams of protein  NUTRITION DIAGNOSIS: Unintentional weight loss related to sub-optimal intake as evidenced by pt report.   Goal: Pt to meet >/= 90% of their estimated nutrition needs.  Monitor:  PO intake  Assessment:  Pt admitted with depression and substance abuse (crack cocaine and ETOH). Per H&P: Pt reports his cocaine use has increased and he drinks "a case a day" of ETOH. Per chart review, pt has lost 17 lb since February 2017 (9% wt loss x 8 months, insignificant for time frame). RD to order ensure supplements given increased needs from substance abuse and steady weight loss.  Height: Ht Readings from Last 1 Encounters:  06/15/16 5\' 8"  (1.727 m)    Weight: Wt Readings from Last 1 Encounters:  06/15/16 183 lb (83 kg)    Weight Hx: Wt Readings from Last 10 Encounters:  06/15/16 183 lb (83 kg)  04/24/16 183 lb (83 kg)  04/24/16 190 lb (86.2 kg)  10/10/15 200 lb (90.7 kg)  08/04/15 193 lb (87.5 kg)    BMI:  Body mass index is 27.83 kg/m. Pt meets criteria for overweight based on current BMI.  Estimated Nutritional Needs: Kcal: 25-30 kcal/kg Protein: > 1 gram protein/kg Fluid: 1 ml/kcal  Diet Order: Diet regular Room service appropriate? Yes; Fluid consistency: Thin Pt is also offered choice of unit snacks mid-morning and mid-afternoon.  Pt is eating as desired.   Lab results and medications reviewed.   Tilda FrancoLindsey Ammarie Matsuura, MS, RD, LDN Pager: [phone] After Hours Pager: [phone]

## 2016-06-16 NOTE — [facility] Group Notes (Signed)
[facility]   06/16/2016 12:52 PM  Participation Quality:  Invited. DID NOT ATTEND. Pt chose to remain in bed.    Pulte HomesHeather N Smart

## 2016-06-16 NOTE — Tx Team (Signed)
Interdisciplinary Treatment and Diagnostic Plan Update  06/16/2016 Time of Session: 9:30AM Brian Nolan MRN: [id number]  Principal Diagnosis: <principal problem not specified>  Secondary Diagnoses: Active Problems:   MDD (major depressive disorder), recurrent severe, without psychosis (HCC)   Current Medications:  Current Facility-Administered Medications  Medication Dose Route Frequency Provider Last Rate Last Dose  . feeding supplement (ENSURE ENLIVE) (ENSURE ENLIVE) liquid 237 mL  237 mL Oral BID BM Oneta Rack, NP      . FLUoxetine (PROZAC) capsule 20 mg  20 mg Oral Daily Shuvon B Rankin, NP   20 mg at 06/16/16 0831  . gemfibrozil (LOPID) tablet 600 mg  600 mg Oral BID AC Shuvon B Rankin, NP   600 mg at 06/16/16 0831  . hydrOXYzine (ATARAX/VISTARIL) tablet 25 mg  25 mg Oral Q6H PRN Shuvon B Rankin, NP   25 mg at 06/15/16 2144  . ibuprofen (ADVIL,MOTRIN) tablet 600 mg  600 mg Oral Q8H PRN Shuvon B Rankin, NP   600 mg at 06/15/16 2144  . loperamide (IMODIUM) capsule 2-4 mg  2-4 mg Oral PRN Shuvon B Rankin, NP      . LORazepam (ATIVAN) tablet 1 mg  1 mg Oral Q6H PRN Shuvon B Rankin, NP   1 mg at 06/15/16 2310  . multivitamin with minerals tablet 1 tablet  1 tablet Oral Daily Shuvon B Rankin, NP   1 tablet at 06/16/16 0831  . nicotine (NICODERM CQ - dosed in mg/24 hours) patch 21 mg  21 mg Transdermal Daily Shuvon B Rankin, NP   21 mg at 06/16/16 0830  . ondansetron (ZOFRAN-ODT) disintegrating tablet 4 mg  4 mg Oral Q6H PRN Shuvon B Rankin, NP      . thiamine (B-1) injection 100 mg  100 mg Intramuscular Once Shuvon B Rankin, NP      . thiamine (VITAMIN B-1) tablet 100 mg  100 mg Oral Daily Shuvon B Rankin, NP   100 mg at 06/16/16 0831  . traZODone (DESYREL) tablet 150 mg  150 mg Oral QHS Shuvon B Rankin, NP   150 mg at 06/15/16 2144   PTA Medications: Prescriptions Prior to Admission  Medication Sig Dispense Refill Last Dose  . ENSURE (ENSURE) Take 237 mLs by mouth 2  (two) times daily between meals.   Past Month at Unknown time  . FLUoxetine (PROZAC) 20 MG capsule Take 1 capsule (20 mg total) by mouth daily. 30 capsule 0 Past Month at Unknown time  . gemfibrozil (LOPID) 600 MG tablet Take 1 tablet (600 mg total) by mouth 2 (two) times daily before a meal. 60 tablet 0 Past Month at Unknown time  . ibuprofen (ADVIL,MOTRIN) 800 MG tablet Take 1 tablet (800 mg total) by mouth 3 (three) times daily. 21 tablet 0 Past Month at Unknown time  . traZODone (DESYREL) 150 MG tablet Take 1 tablet (150 mg total) by mouth at bedtime. 30 tablet 0 Past Month at Unknown time  . nicotine (NICODERM CQ - DOSED IN MG/24 HOURS) 14 mg/24hr patch Place 14 mg onto the skin daily.   More than a month at Unknown time    Patient Stressors: Substance abuse  Patient Strengths: Ability for insight Capable of independent living Motivation for treatment/growth  Treatment Modalities: Medication Management, Group therapy, Case management,  1 to 1 session with clinician, Psychoeducation, Recreational therapy.   Physician Treatment Plan for Primary Diagnosis: <principal problem not specified> Long Term Goal(s):     Short Term Goals:  Medication Management: Evaluate patient's response, side effects, and tolerance of medication regimen.  Therapeutic Interventions: 1 to 1 sessions, Unit Group sessions and Medication administration.  Evaluation of Outcomes: Progressing  Physician Treatment Plan for Secondary Diagnosis: Active Problems:   MDD (major depressive disorder), recurrent severe, without psychosis (HCC)  Long Term Goal(s):     Short Term Goals:       Medication Management: Evaluate patient's response, side effects, and tolerance of medication regimen.  Therapeutic Interventions: 1 to 1 sessions, Unit Group sessions and Medication administration.  Evaluation of Outcomes: Progressing   RN Treatment Plan for Primary Diagnosis: <principal problem not specified> Long Term  Goal(s): Knowledge of disease and therapeutic regimen to maintain health will improve  Short Term Goals: Ability to remain free from injury will improve, Ability to disclose and discuss suicidal ideas and Ability to identify and develop effective coping behaviors will improve  Medication Management: RN will administer medications as ordered by provider, will assess and evaluate patient's response and provide education to patient for prescribed medication. RN will report any adverse and/or side effects to prescribing provider.  Therapeutic Interventions: 1 on 1 counseling sessions, Psychoeducation, Medication administration, Evaluate responses to treatment, Monitor vital signs and CBGs as ordered, Perform/monitor CIWA, COWS, AIMS and Fall Risk screenings as ordered, Perform wound care treatments as ordered.  Evaluation of Outcomes: Progressing   LCSW Treatment Plan for Primary Diagnosis: <principal problem not specified> Long Term Goal(s): Safe transition to appropriate next level of care at discharge, Engage patient in therapeutic group addressing interpersonal concerns.  Short Term Goals: Engage patient in aftercare planning with referrals and resources, Increase social support and Facilitate patient progression through stages of change regarding substance use diagnoses and concerns  Therapeutic Interventions: Assess for all discharge needs, 1 to 1 time with Social worker, Explore available resources and support systems, Assess for adequacy in community support network, Educate family and significant other(s) on suicide prevention, Complete Psychosocial Assessment, Interpersonal group therapy.  Evaluation of Outcomes: Progressing   Progress in Treatment: Attending groups:No.  Participating in groups: No. Taking medication as prescribed: Yes. Toleration medication: Yes. Family/Significant other contact made: No, will contact:  family support if patient consents Patient understands  diagnosis: Yes. Discussing patient identified problems/goals with staff: Yes. Medical problems stabilized or resolved: Yes. Denies suicidal/homicidal ideation: Yes. Issues/concerns per patient self-inventory: No. Other: n/a  New problem(s) identified: No, Describe:  n/a  New Short Term/Long Term Goal(s):  Discharge Plan or Barriers: Pt is hoping to get into inpatient treatment. He did not attend group this morning. CSW assessing for appropriate referrals--[facility] and [facility].   Reason for Continuation of Hospitalization: Depression Medication stabilization Withdrawal symptoms  Estimated Length of Stay: 2-4 days   Attendees: Patient: 06/16/2016 8:35 AM  Physician: Dr. Mckinley Jewelates MD 06/16/2016 8:35 AM  Nursing: Ricard DillonElizabeth RN; Christa RN 06/16/2016 8:35 AM  RN Care Manager: Onnie BoerJennifer Clark CM 06/16/2016 8:35 AM  Social Worker:  Trula SladeHeather Smart, LCSW 06/16/2016 8:35 AM  Recreational Therapist:  06/16/2016 8:35 AM  Other:  06/16/2016 8:35 AM  Other:  06/16/2016 8:35 AM  Other: 06/16/2016 8:35 AM    Scribe for Treatment Team: Ledell PeoplesHeather N Smart, LCSW 06/16/2016 8:35 AM

## 2016-06-16 NOTE — Plan of Care (Signed)
Problem: Medication: Goal: Compliance with prescribed medication regimen will improve Outcome: Progressing Pt has been compliant with scheduled medication tonight.    

## 2016-06-16 NOTE — [facility] Suicide Risk Assessment (Signed)
[facility]   Nursing information obtained from:   pt  Demographic factors:   male Current Mental Status:   see MAR Loss Factors:   relationship Historical Factors:   substance use Risk Reduction Factors:   religious beliefs employed  Total Time spent with patient: 30 minutes Principal Problem: Cocaine dependence, continuous (HCC) Diagnosis:   Patient Active Problem List   Diagnosis Date Noted  . Cocaine dependence, continuous (HCC) [F14.20] 06/16/2016  . MDD (major depressive disorder), recurrent severe, without psychosis (HCC) [F33.2] 06/15/2016  . Alcohol use disorder, severe, dependence (HCC) [F10.20] 04/25/2016  . Tobacco use disorder [F17.200] 04/25/2016  . Hyperlipidemia [E78.5] 10/12/2015  . MDD (major depressive disorder), recurrent episode, moderate (HCC) [F33.1] 10/11/2015  . Cannabis use disorder, mild, abuse [F12.10] 10/11/2015   Subjective Data: Patient reports that he would rather be dead than continue to use substances but denies acute plan to act at present denies any homicidal ideation, plan or intent  Continued Clinical Symptoms:  Alcohol Use Disorder Identification Test Final Score (AUDIT): 36 The "Alcohol Use Disorders Identification Test", Guidelines for Use in Primary Care, Second Edition.  World Science writerHealth Organization [facility](WHO). Score between 0-7:  no or low risk or alcohol related problems. Score between 8-15:  moderate risk of alcohol related problems. Score between 16-19:  high risk of alcohol related problems. Score 20 or above:  warrants further diagnostic evaluation for alcohol dependence and treatment.   CLINICAL FACTORS:   Alcohol/Substance Abuse/Dependencies   Musculoskeletal: Strength & Muscle Tone: within normal limits Gait & Station: normal Patient leans: N/A  Psychiatric Specialty Exam: Physical Exam  ROS  Blood pressure 102/72, pulse 78, temperature 97.7 F (36.5 C), temperature source Oral, resp. rate 16, height  5\' 8"  (1.727 m), weight 83 kg (183 lb), SpO2 100 %.Body mass index is 27.83 kg/m.  General Appearance: Fairly Groomed  Eye Contact:  Good  Speech:  Clear and Coherent  Volume:  Normal  Mood:  Anxious  Affect:  Appropriate and Congruent  Thought Process:  Coherent and Goal Directed  Orientation:  Full (Time, Place, and Person)  Thought Content:  Negative  Suicidal Thoughts:  Yes.  without intent/plan  Homicidal Thoughts:  No  Memory:  Negative  Judgement:  Fair  Insight:  Good  Psychomotor Activity:  Normal  Concentration:  Concentration: Good and Attention Span: Good  Recall:  Good  Fund of Knowledge:  Good  Language:  Good  Akathisia:  No  Handed:  Right  AIMS (if indicated):     Assets:  Communication Skills Desire for Improvement Physical Health Resilience  ADL's:  Intact  Cognition:  WNL  Sleep:  Number of Hours: 6      COGNITIVE FEATURES THAT CONTRIBUTE TO RISK:  None    SUICIDE RISK:   Mild:  Suicidal ideation of limited frequency, intensity, duration, and specificity.  There are no identifiable plans, no associated intent, mild dysphoria and related symptoms, good self-control (both objective and subjective assessment), few other risk factors, and identifiable protective factors, including available and accessible social support.   PLAN OF CARE: PAA  I certify that inpatient services furnished can reasonably be expected to improve the patient's condition.  Acquanetta SitElizabeth Woods Justino Boze, MD 06/16/2016, 12:33 PM

## 2016-06-16 NOTE — Progress Notes (Signed)
D   Pt is cooperative and pleasant on approach   He interacts appropriately with other patients   He attended group and was appropriate A   Verbal support given   Medications administered and effectiveness monitored   Q 15 min checks R   Pt is safe at receptive to verbal support
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
## 2016-06-17 MED ORDER — HYDROXYZINE HCL 25 MG PO TABS: 25.0000 mg | ORAL_TABLET | Freq: Four times a day (QID) | ORAL | 0 refills | Status: DC | PRN

## 2016-06-17 MED ORDER — HYDROXYZINE HCL 25 MG PO TABS
75.0000 mg | ORAL_TABLET | Freq: Every evening | ORAL | Status: DC | PRN
  Administered 2016-06-17: 75 mg via ORAL
  Filled 2016-06-17 (×2): qty 42
  Filled 2016-06-17: qty 3

## 2016-06-17 MED ORDER — NICOTINE 21 MG/24HR TD PT24: 21.0000 mg | MEDICATED_PATCH | Freq: Every day | TRANSDERMAL | 0 refills | Status: DC

## 2016-06-17 MED ORDER — THIAMINE HCL 100 MG PO TABS: 100.0000 mg | ORAL_TABLET | Freq: Every day | ORAL | 0 refills | Status: DC

## 2016-06-17 MED ORDER — HYDROXYZINE HCL 25 MG PO TABS: 75.0000 mg | ORAL_TABLET | Freq: Every evening | ORAL | 0 refills | Status: DC | PRN

## 2016-06-17 MED ORDER — FLUOXETINE HCL 20 MG PO CAPS: 20.0000 mg | ORAL_CAPSULE | Freq: Every day | ORAL | 0 refills | Status: DC

## 2016-06-17 NOTE — B[facility] Suicide Risk Assessment (Signed)
[facility] INPATIENT:  Family/Significant Other Suicide Prevention Education  Suicide Prevention Education:  Patient Refusal for Family/Significant Other Suicide Prevention Education: The patient Brian RuizJohn Thomes Dinningnthony Gail Middlekauff has refused to provide written consent for family/significant other to be provided Family/Significant Other Suicide Prevention Education during admission and/or prior to discharge.  Physician notified.  SPE completed with pt, as pt refused to consent to family contact. SPI pamphlet provided to pt and pt was encouraged to share information with support network, ask questions, and talk about any concerns relating to SPE. Pt denies access to guns/firearms and verbalized understanding of information provided. Mobile Crisis information also provided to pt.   Brooklynne Pereida N Smart LCSW 06/17/2016, 10:23 AM

## 2016-06-17 NOTE — [facility] Group Notes (Signed)
[facility] LCSW Group Therapy  06/17/2016 1:35 PM  Type of Therapy:  Group Therapy  Participation Level:  Active  Participation Quality:  Attentive  Affect:  Appropriate  Cognitive:  Alert and Oriented  Insight:  Engaged  Engagement in Therapy:  Improving  Modes of Intervention:  Discussion, Education, Exploration, Problem-solving, Rapport Building, Socialization and Support  Summary of Progress/Problems: [facility] Speaker came to talk about his personal journey with substance abuse and addiction. The pt processed ways by which to relate to the speaker. [facility] speaker provided handouts and educational information pertaining to groups and services offered by the MHA.   Zhane Donlan N Smart LCSW 06/17/2016, 1:35 PM  

## 2016-06-17 NOTE — Progress Notes (Signed)
  [facility] :  Will you be returning to the same living situation after discharge:  No.Pt scheduled [facility] Wed AM (possible admission).  At discharge, do you have transportation home?: Yes,  taxi voucher/bus pass in chart Do you have the ability to pay for your medications: Yes,  mental health  Release of information consent forms completed and submitted to medical records by CSW. Patient to Follow up at: Follow-up Information    [facility] .   Specialty:  Behavioral Health Why:  Walk in between 8am-9am for hospital follow-up/medication management/assessment for counseling services.  Contact information: [address] [place] [address] [phone]        [facility] Follow up on 06/18/2016.   Why:  7:45AM appt.  Contact information: [place] [place] [address] [phone]           Next level of care provider has access to [facility] Link:no  Safety Planning and Suicide Prevention discussed: Yes,  SPE completed with pt; pt did not consent to family contact. SPI pamphlet and Mobile crisis information also provided.  Have you used any form of tobacco in the last 30 days? (Cigarettes, Smokeless Tobacco, Cigars, and/or Pipes): Yes  Has patient been referred to the Quitline?: Patient refused referral  Patient has been referred for addiction treatment: Yes  Amoree Newlon N Smart LCSW 06/17/2016, 10:23 AM

## 2016-06-17 NOTE — Tx Team (Signed)
Interdisciplinary Treatment and Diagnostic Plan Update  06/17/2016 Time of Session: 9:30AM Brian Nolan MRN: [id number]  Principal Diagnosis: Cocaine dependence, continuous ([place])  Secondary Diagnoses: Principal Problem:   Cocaine dependence, continuous ([place]) Active Problems:   MDD (major depressive disorder), recurrent severe, without psychosis ([place])   Current Medications:  Current Facility-Administered Medications  Medication Dose Route Frequency Provider Last Rate Last Dose  . feeding supplement (ENSURE ENLIVE) (ENSURE ENLIVE) liquid 237 mL  237 mL Oral BID BM Myer Peer Cobos, MD   237 mL at 06/17/16 0817  . FLUoxetine (PROZAC) capsule 20 mg  20 mg Oral Daily Shuvon B Rankin, NP   20 mg at 06/17/16 0818  . gemfibrozil (LOPID) tablet 600 mg  600 mg Oral BID AC Shuvon B Rankin, NP   600 mg at 06/17/16 0609  . hydrOXYzine (ATARAX/VISTARIL) tablet 25 mg  25 mg Oral Q6H PRN Shuvon B Rankin, NP   25 mg at 06/16/16 2224  . hydrOXYzine (ATARAX/VISTARIL) tablet 75 mg  75 mg Oral QHS PRN Linard Millers, MD      . ibuprofen (ADVIL,MOTRIN) tablet 600 mg  600 mg Oral Q8H PRN Shuvon B Rankin, NP   600 mg at 06/16/16 1713  . loperamide (IMODIUM) capsule 2-4 mg  2-4 mg Oral PRN Shuvon B Rankin, NP      . LORazepam (ATIVAN) tablet 1 mg  1 mg Oral Q6H PRN Shuvon B Rankin, NP   1 mg at 06/15/16 2310  . multivitamin with minerals tablet 1 tablet  1 tablet Oral Daily Shuvon B Rankin, NP   1 tablet at 06/17/16 0818  . nicotine (NICODERM CQ - dosed in mg/24 hours) patch 21 mg  21 mg Transdermal Daily Shuvon B Rankin, NP   21 mg at 06/17/16 0817  . ondansetron (ZOFRAN-ODT) disintegrating tablet 4 mg  4 mg Oral Q6H PRN Shuvon B Rankin, NP      . thiamine (B-1) injection 100 mg  100 mg Intramuscular Once Shuvon B Rankin, NP      . thiamine (VITAMIN B-1) tablet 100 mg  100 mg Oral Daily Shuvon B Rankin, NP   100 mg at 06/17/16 0818   PTA Medications: Prescriptions Prior to Admission   Medication Sig Dispense Refill Last Dose  . ENSURE (ENSURE) Take 237 mLs by mouth 2 (two) times daily between meals.   Past Month at Unknown time  . FLUoxetine (PROZAC) 20 MG capsule Take 1 capsule (20 mg total) by mouth daily. 30 capsule 0 Past Month at Unknown time  . gemfibrozil (LOPID) 600 MG tablet Take 1 tablet (600 mg total) by mouth 2 (two) times daily before a meal. 60 tablet 0 Past Month at Unknown time  . ibuprofen (ADVIL,MOTRIN) 800 MG tablet Take 1 tablet (800 mg total) by mouth 3 (three) times daily. 21 tablet 0 Past Month at Unknown time  . traZODone (DESYREL) 150 MG tablet Take 1 tablet (150 mg total) by mouth at bedtime. 30 tablet 0 Past Month at Unknown time  . nicotine (NICODERM CQ - DOSED IN MG/24 HOURS) 14 mg/24hr patch Place 14 mg onto the skin daily.   More than a month at Unknown time    Patient Stressors: Substance abuse  Patient Strengths: Ability for insight Capable of independent living Motivation for treatment/growth  Treatment Modalities: Medication Management, Group therapy, Case management,  1 to 1 session with clinician, Psychoeducation, Recreational therapy.   Physician Treatment Plan for Primary Diagnosis: Cocaine dependence, continuous ([place]) Long Term  Goal(s): Improvement in symptoms so as ready for discharge Improvement in symptoms so as ready for discharge   Short Term Goals: Ability to demonstrate self-control will improve Ability to maintain clinical measurements within normal limits will improve Compliance with prescribed medications will improve Ability to identify changes in lifestyle to reduce recurrence of condition will improve Ability to identify and develop effective coping behaviors will improve Ability to identify triggers associated with substance abuse/mental health issues will improve  Medication Management: Evaluate patient's response, side effects, and tolerance of medication regimen.  Therapeutic Interventions: 1 to 1 sessions,  Unit Group sessions and Medication administration.  Evaluation of Outcomes: Progressing  Physician Treatment Plan for Secondary Diagnosis: Principal Problem:   Cocaine dependence, continuous ([place]) Active Problems:   MDD (major depressive disorder), recurrent severe, without psychosis ([place])  Long Term Goal(s): Improvement in symptoms so as ready for discharge Improvement in symptoms so as ready for discharge   Short Term Goals: Ability to demonstrate self-control will improve Ability to maintain clinical measurements within normal limits will improve Compliance with prescribed medications will improve Ability to identify changes in lifestyle to reduce recurrence of condition will improve Ability to identify and develop effective coping behaviors will improve Ability to identify triggers associated with substance abuse/mental health issues will improve     Medication Management: Evaluate patient's response, side effects, and tolerance of medication regimen.  Therapeutic Interventions: 1 to 1 sessions, Unit Group sessions and Medication administration.  Evaluation of Outcomes: Met   RN Treatment Plan for Primary Diagnosis: Cocaine dependence, continuous ([place]) Long Term Goal(s): Knowledge of disease and therapeutic regimen to maintain health will improve  Short Term Goals: Ability to remain free from injury will improve, Ability to disclose and discuss suicidal ideas and Ability to identify and develop effective coping behaviors will improve  Medication Management: RN will administer medications as ordered by provider, will assess and evaluate patient's response and provide education to patient for prescribed medication. RN will report any adverse and/or side effects to prescribing provider.  Therapeutic Interventions: 1 on 1 counseling sessions, Psychoeducation, Medication administration, Evaluate responses to treatment, Monitor vital signs and CBGs as ordered, Perform/monitor CIWA,  COWS, AIMS and Fall Risk screenings as ordered, Perform wound care treatments as ordered.  Evaluation of Outcomes: Met   LCSW Treatment Plan for Primary Diagnosis: Cocaine dependence, continuous ([place]) Long Term Goal(s): Safe transition to appropriate next level of care at discharge, Engage patient in therapeutic group addressing interpersonal concerns.  Short Term Goals: Engage patient in aftercare planning with referrals and resources, Increase social support and Facilitate patient progression through stages of change regarding substance use diagnoses and concerns  Therapeutic Interventions: Assess for all discharge needs, 1 to 1 time with Social worker, Explore available resources and support systems, Assess for adequacy in community support network, Educate family and significant other(s) on suicide prevention, Complete Psychosocial Assessment, Interpersonal group therapy.  Evaluation of Outcomes: Met   Progress in Treatment: Attending groups: Yes.  Participating in groups: Yes Taking medication as prescribed: Yes. Toleration medication: Yes. Family/Significant other contact made: SPE completed with pt; pt declined to consent to family contact. Patient understands diagnosis: Yes. Discussing patient identified problems/goals with staff: Yes. Medical problems stabilized or resolved: Yes. Denies suicidal/homicidal ideation: Yes. Issues/concerns per patient self-inventory: No. Other: n/a  New problem(s) identified: No, Describe:  n/a  New Short Term/Long Term Goal(s):  Discharge Plan or Barriers: Pt is hoping to get into inpatient treatment and has been scheduled for screening at [facility]  for Wed 06/18/16 at 7:45AM. Taxi voucher in chart-PT MUST D/C NO LATER THAN 7:00AM ON 06/18/16 (wed). 2 week supply of medications required. MD/RN/NP made aware.   Reason for Continuation of Hospitalization:  Medication management  Estimated Length of Stay: 1 day-pt scheduled for early morning  discharge on Wed (7am).   Attendees: Patient: 06/17/2016 10:20 AM  Physician: Dr. Sharolyn Douglas MD 06/17/2016 10:20 AM  Nursing: Precious Gilding RN 06/17/2016 10:20 AM  RN Care Manager: Lars Pinks CM 06/17/2016 10:20 AM  Social Worker:  Maxie Better, LCSW 06/17/2016 10:20 AM  Recreational Therapist:  06/17/2016 10:20 AM  Other:  06/17/2016 10:20 AM  Other:  06/17/2016 10:20 AM  Other: 06/17/2016 10:20 AM    Scribe for Treatment Team: Kimber Relic Smart, LCSW 06/17/2016 10:20 AM
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
## 2016-06-17 NOTE — Progress Notes (Signed)
Recreation Therapy Notes  Animal-Assisted Activity (AAA) Program Checklist/Progress Notes Patient Eligibility Criteria Checklist & Daily Group note for Rec TxIntervention  Date: 10.17.2017 Time: 2:45pm Location: [address] Dayroom    AAA/T Program Assumption of Risk Form signed by Patient/ or Parent Legal Guardian Yes  Patient is free of allergies or sever asthma Yes  Patient reports no fear of animals Yes  Patient reports no history of cruelty to animals Yes  Patient understands his/her participation is voluntary Yes  Behavioral Response: Did not attend.   Emberlin Verner L Maddilynn Esperanza, LRT/CTRS       Krystn Dermody L 06/17/2016 3:02 PM 

## 2016-06-17 NOTE — Progress Notes (Signed)
[facility]  06/17/2016 9:21 AM  Patient Active Problem List   Diagnosis Date Noted  . Cocaine dependence, continuous (HCC) 06/16/2016  . 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 Alcohol use disorder, severe, dependence (HCC) 04/25/2016  . Tobacco use disorder 04/25/2016  . Hyperlipidemia 10/12/2015  . MDD (major depressive disorder), recurrent episode, moderate (HCC) 10/11/2015  . Cannabis use disorder, mild, abuse 10/11/2015    Diagnosis: Cocaine use disorder, severe  Subjective: Patient denies any acute issues he does report that he did not sleep that well unit with trazodone 150 mg by mouth daily at bedtime and agrees to a trial of changing it to Vistaril. We discussed that the combination of Prozac and trazodone might actually be more activating than sedating at this point. Patient denies any acute plan or intent to harm himself or others at present.  Objective: Well-developed well-nourished male in no apparent distress alert and eating breakfast in the day room. Speech and motor are within normal limits thought processes linear and goal-directed thought content denies any acute plan to harm self or others mood is all right and affect is calm and congruent alert and oriented 3 IQ appears an average range insight and judgment are fair     Current Facility-Administered Medications (Cardiovascular):  .  gemfibrozil (LOPID) tablet 600 mg     Current Facility-Administered Medications (Analgesics):  .  ibuprofen (ADVIL,MOTRIN) tablet 600 mg     Current Facility-Administered Medications (Other):  .  feeding supplement (ENSURE ENLIVE) (ENSURE ENLIVE) liquid 237 mL .  FLUoxetine (PROZAC) capsule 20 mg .  hydrOXYzine (ATARAX/VISTARIL) tablet 25 mg .  loperamide (IMODIUM) capsule 2-4 mg .  LORazepam (ATIVAN) tablet 1 mg .  multivitamin with minerals tablet 1 tablet .  nicotine (NICODERM CQ - dosed in mg/24 hours) patch 21  mg .  ondansetron (ZOFRAN-ODT) disintegrating tablet 4 mg .  thiamine (B-1) injection 100 mg .  thiamine (VITAMIN B-1) tablet 100 mg .  traZODone (DESYREL) tablet 150 mg  No current outpatient prescriptions on file.  Vital Signs:Blood pressure 119/73, pulse 76, temperature 97.5 F (36.4 C), temperature source Oral, resp. rate 18, height 5\' 8"  (1.727 m), weight 83 kg (183 lb), SpO2 100 %.    Lab Results: No results found for this or any previous visit (from the past 48 hour(s)).  Physical Findings: AIMS: Facial and Oral Movements Muscles of Facial Expression: None, normal Lips and Perioral Area: None, normal Jaw: None, normal Tongue: None, normal,Extremity Movements Upper (arms, wrists, hands, fingers): None, normal Lower (legs, knees, ankles, toes): None, normal, Trunk Movements Neck, shoulders, hips: None, normal, Overall Severity Severity of abnormal movements (highest score from questions above): None, normal Incapacitation due to abnormal movements: None, normal Patient's awareness of abnormal movements (rate only patient's report): No Awareness, Dental Status Current problems with teeth and/or dentures?: No Does patient usually wear dentures?: No  CIWA:  CIWA-Ar Total: 1 COWS:      Assessment/Plan: Patient appears to be stabilizing and suicidal ideation appears to be lessening. He is interested in further treatment for his cocaine use disorder recognizing that if he goes back to a environment where everybody is doing crack he is not likely to be successful in his recovery at this time. Social work is working with patient to identify options for further treatment.  Brian SitElizabeth Woods Oates, MD 06/17/2016, 9:21 AM
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
## 2016-06-17 NOTE — Progress Notes (Signed)
D: Pt continues to be very flat and depressed on the unit today. Pt also continues to be very isolative. Pt is excited about his discharge tomorrow. Pt reported that his depression was a 5, his hopelessness was a 5, and that his anxiety was a 0. Medications given as scheduled. Pt reported being negative SI/HI, no AH/VH noted. A: 15 min checks continued for patient safety. R: Pts safety maintained.

## 2016-06-18 MED ORDER — TRAZODONE HCL 50 MG PO TABS
50.0000 mg | ORAL_TABLET | Freq: Every evening | ORAL | Status: DC | PRN
  Administered 2016-06-18: 50 mg via ORAL
  Filled 2016-06-18 (×2): qty 1

## 2016-06-18 MED ORDER — TRAZODONE HCL 50 MG PO TABS: 50.0000 mg | ORAL_TABLET | Freq: Every evening | ORAL | 0 refills | Status: DC | PRN

## 2016-06-18 MED ORDER — TRAZODONE HCL 50 MG PO TABS
50.0000 mg | ORAL_TABLET | Freq: Every evening | ORAL | Status: DC | PRN
Start: 2016-06-18 — End: 2016-06-18
  Administered 2016-06-18: 50 mg via ORAL

## 2016-06-18 NOTE — BHH Suicide Risk Assessment (Signed)
[facility]   Principal Problem: Cocaine dependence, continuous [facility](HCC) Discharge Diagnoses:  Patient Active Problem List   Diagnosis Date Noted  . Cocaine dependence, continuous (HCC) [F14.20] 06/16/2016  . MDD (major depressive disorder), recurrent severe, without psychosis (HCC) [F33.2] 06/15/2016  . Alcohol use disorder, severe, dependence (HCC) [F10.20] 04/25/2016  . Tobacco use disorder [F17.200] 04/25/2016  . Hyperlipidemia [E78.5] 10/12/2015  . MDD (major depressive disorder), recurrent episode, moderate (HCC) [F33.1] 10/11/2015  . Cannabis use disorder, mild, abuse [F12.10] 10/11/2015    Total Time spent with patient: 15 minutes  Musculoskeletal: Strength & Muscle Tone: within normal limits Gait & Station: normal Patient leans: N/A  Psychiatric Specialty Exam: ROS  Blood pressure 106/69, pulse 80, temperature 97.9 F (36.6 C), temperature source Oral, resp. rate 16, height 5\' 8"  (1.727 m), weight 83 kg (183 lb), SpO2 100 %.Body mass index is 27.83 kg/m.  General Appearance: Casual  Eye Contact::  Good  Speech:  Clear and Coherent409  Volume:  Normal  Mood:  Euthymic  Affect:  Congruent  Thought Process:  Coherent  Orientation:  Full (Time, Place, and Person)  Thought Content:  Negative  Suicidal Thoughts:  No  Homicidal Thoughts:  No  Memory:  Negative  Judgement:  Fair  Insight:  Fair  Psychomotor Activity:  Normal  Concentration:  Good  Recall:  Good  Fund of Knowledge:Good  Language: Good  Akathisia:  No  Handed:  Right  AIMS (if indicated):     Assets:  Physical Health  Sleep:  Number of Hours: 5.75  Cognition: WNL  ADL's:  Intact   Mental Status Per Nursing Assessment::   On Admission:     Demographic Factors:  Male and Low socioeconomic status  Loss Factors: NA  Historical Factors: NA  Risk Reduction Factors:   Employed and Positive social support  Continued Clinical Symptoms:  Alcohol/Substance  Abuse/Dependencies  Cognitive Features That Contribute To Risk:  None    Suicide Risk:  Mild:  Suicidal ideation of limited frequency, intensity, duration, and specificity.  There are no identifiable plans, no associated intent, mild dysphoria and related symptoms, good self-control (both objective and subjective assessment), few other risk factors, and identifiable protective factors, including available and accessible social support.  Follow-up Information    [facility] .   Specialty:  Behavioral Health Why:  Walk in between 8am-9am for hospital follow-up/medication management/assessment for counseling services.  Contact information: [address] [place] [address] [phone]        [facility] Follow up on 06/18/2016.   Why:  Screening for possible admission on Wed at 7:45AM. Please bring [place] ID to screening. Thank you.  Contact information: [place] [address] [place] [address] [phone]           Plan Of Care/Follow-up recommendations:  Other:  Patient denies current suicidal or homicidal ideation, plan or intent. He has been accepted in a residential treatment program and will be discharged there.  Acquanetta SitElizabeth Woods Fumiye Lubben, MD 06/18/2016, 3:34 PM

## 2016-06-18 NOTE — Plan of Care (Signed)
Problem: Safety: Goal: Periods of time without injury will increase Outcome: Progressing Pt has not harmed self or others since admission.  He denies SI/HI and verbally contracts for safety.    

## 2016-06-18 NOTE — Progress Notes (Signed)
DAR NOTE: Pt present with flat affect and depressed mood in the unit. Pt has been isolating himself and has been bed most of the time. Pt denies physical pain, took all his meds as scheduled. As per self inventory, pt had a poor night sleep, poor appetite, low energy, and good concentration. Pt rate depression at 10, hopeless ness at 10, and anxiety at 0. Pt's safety ensured with 15 minute and environmental checks. Pt currently denies SI/HI and A/V hallucinations. Pt verbally agrees to seek staff if SI/HI or A/VH occurs and to consult with staff before acting on these thoughts. Will continue POC.
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
## 2016-06-18 NOTE — Progress Notes (Signed)
Recreation Therapy Notes  Date: 06/18/16 Time: 0930 Location: [address] Dayroom  Group Topic: Stress Management  Goal Area(s) Addresses:  Patient will verbalize importance of using healthy stress management.  Patient will identify positive emotions associated with healthy stress management.   Intervention: Stress Management  Activity :  Progressive Muscle Relaxation.  LRT introduced the stress management technique of progressive muscle relaxation.  LRT read a script to allow the patients to engage in the activity.  Patients were to follow along as LRT read script.  Education:  Stress Management, Discharge Planning.   Education Outcome: Acknowledges edcuation/In group clarification offered/Needs additional education  Clinical Observations/Feedback: Pt did not attend group.  Caroll RancherMarjette Indio Santilli, LRT/CTRS         Caroll RancherLindsay, Caffie Sotto A 06/18/2016 12:25 PM

## 2016-06-18 NOTE — Progress Notes (Signed)
[facility]  06/18/2016 2:18 PM  Patient Active Problem List   Diagnosis Date Noted  . Cocaine dependence, continuous (HCC) 06/16/2016  . 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 Alcohol use disorder, severe, dependence (HCC) 04/25/2016  . Tobacco use disorder 04/25/2016  . Hyperlipidemia 10/12/2015  . MDD (major depressive disorder), recurrent episode, moderate (HCC) 10/11/2015  . Cannabis use disorder, mild, abuse 10/11/2015    Diagnosis: Cocaine disorder severe  Subjective: [facility]. He is in agreement with this plan mood is good and he denies any current suicidal or homicidal ideation, plan or intent. Objective: Well-developed well-nourished man in no apparent distress pleasant and appropriate speech and motor within normal limits mood is good affect is euthymic processes linear and goal-directed thought content no psychosis no mania no suicidal or homicidal ideation, plan or intent, alert and oriented 3 IQ appears an average range insight and judgment are fair     Current Facility-Administered Medications (Cardiovascular):  .  gemfibrozil (LOPID) tablet 600 mg     Current Facility-Administered Medications (Analgesics):  .  ibuprofen (ADVIL,MOTRIN) tablet 600 mg     Current Facility-Administered Medications (Other):  .  feeding supplement (ENSURE ENLIVE) (ENSURE ENLIVE) liquid 237 mL .  FLUoxetine (PROZAC) capsule 20 mg .  hydrOXYzine (ATARAX/VISTARIL) tablet 25 mg .  hydrOXYzine (ATARAX/VISTARIL) tablet 75 mg .  loperamide (IMODIUM) capsule 2-4 mg .  LORazepam (ATIVAN) tablet 1 mg .  multivitamin with minerals tablet 1 tablet .  nicotine (NICODERM CQ - dosed in mg/24 hours) patch 21 mg .  ondansetron (ZOFRAN-ODT) disintegrating tablet 4 mg .  thiamine (B-1) injection 100 mg .  thiamine (VITAMIN B-1) tablet 100 mg  Current Outpatient Prescriptions (Other):  Marland Kitchen.   FLUoxetine (PROZAC) 20 MG capsule, Take 1 capsule (20 mg total) by mouth daily. .  hydrOXYzine (ATARAX/VISTARIL) 25 MG tablet, Take 1 tablet (25 mg total) by mouth every 6 (six) hours as needed (anxiety/agitation or CIWA < or = 10). .  hydrOXYzine (ATARAX/VISTARIL) 25 MG tablet, Take 3 tablets (75 mg total) by mouth at bedtime as needed (sleep). .  nicotine (NICODERM CQ - DOSED IN MG/24 HOURS) 21 mg/24hr patch, Place 1 patch (21 mg total) onto the skin daily. Marland Kitchen.  thiamine 100 MG tablet, Take 1 tablet (100 mg total) by mouth daily.  Vital Signs:Blood pressure 106/69, pulse 80, temperature 97.9 F (36.6 C), temperature source Oral, resp. rate 16, height 5\' 8"  (1.727 m), weight 83 kg (183 lb), SpO2 100 %.    Lab Results: No results found for this or any previous visit (from the past 48 hour(s)).  Physical Findings: AIMS: Facial and Oral Movements Muscles of Facial Expression: None, normal Lips and Perioral Area: None, normal Jaw: None, normal Tongue: None, normal,Extremity Movements Upper (arms, wrists, hands, fingers): None, normal Lower (legs, knees, ankles, toes): None, normal, Trunk Movements Neck, shoulders, hips: None, normal, Overall Severity Severity of abnormal movements (highest score from questions above): None, normal Incapacitation due to abnormal movements: None, normal Patient's awareness of abnormal movements (rate only patient's report): No Awareness, Dental Status Current problems with teeth and/or dentures?: No Does patient usually wear dentures?: No  CIWA:  CIWA-Ar Total: 1 COWS:      Assessment/Plan: Patient doing well and will transition to residential program tomorrow if current course continues.  Acquanetta SitElizabeth Woods Koda Routon, MD 06/18/2016, 2:18 PM

## 2016-06-18 NOTE — Progress Notes (Signed)
  [facility] :  Will you be returning to the same living situation after discharge:  No.[facility]--changed to Thursday AM--CS[facility] 06/18/2016 12:41 PM to reschedule (possible admission).  At discharge, do you have transportation home?: Yes,  taxi voucher/bus pass in chart Do you have the ability to pay for your medications: Yes,  mental health  Release of information consent forms completed and submitted to medical records by CSW. Patient to Follow up at: Follow-up Information    [facility] .   Specialty:  Behavioral Health Why:  Walk in between 8am-9am for hospital follow-up/medication management/assessment for counseling services.  Contact information: [address] [place] [address] [phone]        [facility] Follow up on 06/18/2016.   Why:  Screening for possible admission on Wed at 7:45AM. Please bring [place] ID to screening. Thank you.  Contact information: [place] [place] [address] [phone]           Next level of care provider has [facility] Link:no  Safety Planning and Suicide Prevention discussed: Yes,  SPE completed with pt; pt did not consent to family contact. SPI pamphlet and Mobile crisis information also provided.  Have you used any form of tobacco in the last 30 days? (Cigarettes, Smokeless Tobacco, Cigars, and/or Pipes): Yes  Has patient been referred to the Quitline?: Patient refused referral  Patient has been referred for addiction treatment: Yes  Rynn Markiewicz N Smart LCSW 06/18/2016, 12:41 PM

## 2016-06-18 NOTE — Progress Notes (Signed)
Patient ID: Brian CosierJohn Anthony Gail Nolan, male   DOB: [birth year], 35 y.o.   MRN: [id number] D: client visible on the unit, reports "I was suppose to leave yesterday, but it didn't happen, but I'm still happy" "I'm going to accomplish what I set out to to do" A: Writer provided emotional support, encouraged client to move forward with recovery. Medications reviewed, administered as ordered. Staff will monitor q10215min for safety. R: Client is safe on the unit, attended group.

## 2016-06-18 NOTE — Progress Notes (Signed)
Pt attended NA meeting this evening.  

## 2016-06-18 NOTE — Progress Notes (Signed)
Medication change note  Patient reported he did not like the Vistaril he tried for sleep last night stating it made him urinate all night long. We will return to the trazodone instead.  Assessment/plan DC Vistaril secondary to complaints of side effects and restart trazodone at 50 mg by mouth daily at bedtime when necessary.Brian SitElizabeth Woods Nolan Reaume, MD

## 2016-06-18 NOTE — Progress Notes (Signed)
D: Pt was in in the day room upon initial approach.  Pt presents with appropriate affect and depressed mood.  He reports [facility], then I'm going to get back into a safe environment, my life is gonna flourish."  Pt reports he feels safe to discharge.  Pt denies SI/HI, denies hallucinations, reports right-sided rib cage pain of 10/10.  Pt has been visible in milieu interacting with peers and staff appropriately.  Pt attended evening group.   A: Actively listened to pt and offered support and encouragement. PRN medication administered for sleep. R: Pt is safe on the unit.  Pt is compliant with medications.  Pt verbally contracts for safety.  Will continue to monitor and assess.

## 2017-01-31 ENCOUNTER — Encounter (HOSPITAL_COMMUNITY): Payer: Self-pay | Admitting: Emergency Medicine

## 2017-01-31 ENCOUNTER — Emergency Department (HOSPITAL_COMMUNITY)
Admission: EM | Admit: 2017-01-31 | Discharge: 2017-02-01 | Disposition: A | Discharge status: Home / Self Care | Payer: Federal, State, Local not specified - Other | Attending: Emergency Medicine | Admitting: Emergency Medicine

## 2017-01-31 DIAGNOSIS — F1420 Cocaine dependence, uncomplicated: Secondary | ICD-10-CM | POA: Insufficient documentation

## 2017-01-31 DIAGNOSIS — F330 Major depressive disorder, recurrent, mild: Secondary | ICD-10-CM | POA: Diagnosis present

## 2017-01-31 DIAGNOSIS — Z79899 Other long term (current) drug therapy: Secondary | ICD-10-CM | POA: Insufficient documentation

## 2017-01-31 DIAGNOSIS — F17210 Nicotine dependence, cigarettes, uncomplicated: Secondary | ICD-10-CM | POA: Insufficient documentation

## 2017-01-31 DIAGNOSIS — Z7289 Other problems related to lifestyle: Secondary | ICD-10-CM

## 2017-01-31 DIAGNOSIS — F1910 Other psychoactive substance abuse, uncomplicated: Secondary | ICD-10-CM

## 2017-01-31 DIAGNOSIS — Z789 Other specified health status: Secondary | ICD-10-CM

## 2017-01-31 DIAGNOSIS — F1010 Alcohol abuse, uncomplicated: Secondary | ICD-10-CM | POA: Insufficient documentation

## 2017-01-31 LAB — RAPID URINE DRUG SCREEN, HOSP PERFORMED
AMPHETAMINES: NOT DETECTED
BENZODIAZEPINES: NOT DETECTED
Barbiturates: NOT DETECTED
COCAINE: POSITIVE — AB
Opiates: NOT DETECTED
TETRAHYDROCANNABINOL: NOT DETECTED

## 2017-01-31 LAB — CBC
HEMATOCRIT: 45.2 % (ref 39.0–52.0)
HEMOGLOBIN: 16 g/dL (ref 13.0–17.0)
MCH: 30.1 pg (ref 26.0–34.0)
MCHC: 35.4 g/dL (ref 30.0–36.0)
MCV: 85.1 fL (ref 78.0–100.0)
Platelets: 264 10*3/uL (ref 150–400)
RBC: 5.31 MIL/uL (ref 4.22–5.81)
RDW: 14.3 % (ref 11.5–15.5)
WBC: 13.1 10*3/uL — AB (ref 4.0–10.5)

## 2017-01-31 LAB — COMPREHENSIVE METABOLIC PANEL
ALBUMIN: 4 g/dL (ref 3.5–5.0)
ALT: 30 U/L (ref 17–63)
AST: 40 U/L (ref 15–41)
Alkaline Phosphatase: 65 U/L (ref 38–126)
Anion gap: 11 (ref 5–15)
BUN: 10 mg/dL (ref 6–20)
CO2: 25 mmol/L (ref 22–32)
Calcium: 8.9 mg/dL (ref 8.9–10.3)
Chloride: 103 mmol/L (ref 101–111)
Creatinine, Ser: 1.07 mg/dL (ref 0.61–1.24)
GFR calc Af Amer: >60 mL/min (ref >60)
GFR calc non Af Amer: >60 mL/min (ref >60)
GLUCOSE: 110 mg/dL — AB (ref 65–99)
Potassium: 4.2 mmol/L (ref 3.5–5.1)
SODIUM: 139 mmol/L (ref 135–145)
Total Bilirubin: 1.6 mg/dL — ABNORMAL HIGH (ref 0.3–1.2)
Total Protein: 7.2 g/dL (ref 6.5–8.1)

## 2017-01-31 LAB — ETHANOL: Alcohol, Ethyl (B): 137 mg/dL — ABNORMAL HIGH (ref <5)

## 2017-01-31 LAB — SALICYLATE LEVEL: Salicylate Lvl: <7 mg/dL (ref 2.8–30.0)

## 2017-01-31 LAB — ACETAMINOPHEN LEVEL

## 2017-01-31 MED ORDER — LORAZEPAM 2 MG/ML IJ SOLN: 0.0000 mg | Freq: Four times a day (QID) | INTRAMUSCULAR | Status: DC

## 2017-01-31 MED ORDER — LORAZEPAM 2 MG/ML IJ SOLN: 0.0000 mg | Freq: Two times a day (BID) | INTRAMUSCULAR | Status: DC

## 2017-01-31 MED ORDER — VITAMIN B-1 100 MG PO TABS: 100.0000 mg | ORAL_TABLET | Freq: Every day | ORAL | Status: DC

## 2017-01-31 MED ORDER — LORAZEPAM 1 MG PO TABS: 0.0000 mg | ORAL_TABLET | Freq: Four times a day (QID) | ORAL | Status: DC

## 2017-01-31 MED ORDER — FLUOXETINE HCL 20 MG PO CAPS: 20.0000 mg | ORAL_CAPSULE | Freq: Every day | ORAL | Status: DC

## 2017-01-31 MED ORDER — TRAZODONE HCL 50 MG PO TABS: 50.0000 mg | ORAL_TABLET | Freq: Every evening | ORAL | Status: DC | PRN

## 2017-01-31 MED ORDER — THIAMINE HCL 100 MG/ML IJ SOLN: 100.0000 mg | Freq: Every day | INTRAMUSCULAR | Status: DC

## 2017-01-31 MED ORDER — LORAZEPAM 1 MG PO TABS
1.0000 mg | ORAL_TABLET | Freq: Four times a day (QID) | ORAL | Status: DC | PRN
  Administered 2017-01-31: 1 mg via ORAL
  Filled 2017-01-31: qty 1

## 2017-01-31 MED ORDER — LORAZEPAM 1 MG PO TABS
0.0000 mg | ORAL_TABLET | Freq: Two times a day (BID) | ORAL | Status: DC
Start: 2017-02-02 — End: 2017-01-31

## 2017-01-31 MED ORDER — HYDROXYZINE HCL 25 MG PO TABS
25.0000 mg | ORAL_TABLET | Freq: Four times a day (QID) | ORAL | Status: DC | PRN
  Filled 2017-01-31: qty 1

## 2017-01-31 MED ORDER — FLUOXETINE HCL 10 MG PO CAPS
10.0000 mg | ORAL_CAPSULE | Freq: Every day | ORAL | Status: DC
  Administered 2017-01-31: 10 mg via ORAL
  Filled 2017-01-31: qty 1

## 2017-01-31 NOTE — BH Assessment (Addendum)
Assessment Note  Brian Nolan is an 36 y.o. male that presents this date voluntary with AH stating he is hearing voices but is vague in reference to content. Patient states he has thoughts of self harm but no plan. Patient stated he has not been on medication/s since January 2018 reporting he had been receiving services from [facility]. Patient reports multiple hospitalizations for MH issues stating he was diagnosed with Schizophrenia "years ago." Patient has a history of SA use reporting frequent alcohol use (4 to 6 12 oz beers three to four times a week with last use on 01/30/17 when patient reported consuming 2 12 oz beers) cocaine abuse (patient reports using 1 gram (crack) three times "or more" weekly with last use "sometimes last week" but is vague in reference to amount). Patient also admits to ongoing Cannabis use reporting using 1 gram or more "several times a week" with last use two days ago. Patient denies any other illicit SA use. Patient states he has been receiving services from [facility] "on and off " for the last year. Patient states he has not seen that provider since January 2018. Patient denies receiving any services from any other OP providers. Patient denies any H/I or VH.  Patient is vague in reference to plan and denies any command voices. Patient does report ongoing depression with symptoms to include: worthlessness and isolating. Per notes, patient brought in by [facility] voluntarily stating patient was detained at [place] and the patient flagged him down and told him he was hearing voices. Patient requested to come to the hospital for help. Patient states he has not been on his medication/s since January 2018. Patient  states the voices are telling him to do "bad things." Patient states he has had thoughts of hurting himself but the police took his knife. Patient is vague in reference to plan during assessment. Patient is requesting a voluntary admission to assist with medication  management. Case was staffed with Shaune Pollack DNP who recommended patient be re-evaluated in the a.m.      Diagnosis: Schizophrenia (per notes)   Past Medical History:  Past Medical History:  Diagnosis Date  . Depression   . Medical history non-contributory   . Schizophrenia (HCC)   . Substance abuse     Past Surgical History:  Procedure Laterality Date  . NO PAST SURGERIES      Family History:  Family History  Problem Relation Age of Onset  . Schizophrenia Maternal Uncle     Social History:  reports that he has been smoking Cigarettes.  He has a 10.00 pack-year smoking history. He has never used smokeless tobacco. He reports that he drinks about 3.6 - 14.4 oz of alcohol per week . He reports that he uses drugs, including "Crack" cocaine and Marijuana.  Additional Social History:  Alcohol / Drug Use Pain Medications: denies Prescriptions: denies Over the Counter: denies History of alcohol / drug use?: Yes Longest period of sobriety (when/how long): 5 months while incarcerated, 2 months while in the community Negative Consequences of Use: Personal relationships, Financial Withdrawal Symptoms:  (Pt denies) Substance #1 Name of Substance 1: ETOH 1 - Age of First Use: 25 1 - Amount (size/oz): 12 oz beers 1 - Frequency: Daily 1 - Duration: Ongoing 1 - Last Use / Amount: 01/30/17 pt reports consuming 2 12 oz beers Substance #2 Name of Substance 2: Cocaine (Crack) 2 - Age of First Use: 36 yrs old  2 - Amount (size/oz): 2 grams  2 -  Frequency: Pt states three  2 - Duration: ongoing  2 - Last Use / Amount: 01/30/17 pt states 1 gram Substance #3 Name of Substance 3: THC 3 - Age of First Use: 36 yrs old  3 - Amount (size/oz): 1 joint per use 3 - Frequency: Two to three times a week  3 - Duration: Ongoing 3 - Last Use / Amount: Pt states "sometimes last week."  CIWA: CIWA-Ar BP: 123/88 Pulse Rate: (!) 105 Nausea and Vomiting: no nausea and no vomiting Tactile Disturbances:  none Tremor: no tremor Auditory Disturbances: not present Paroxysmal Sweats: no sweat visible Visual Disturbances: not present Anxiety: mildly anxious Headache, Fullness in Head: none present Agitation: normal activity Orientation and Clouding of Sensorium: oriented and can do serial additions CIWA-Ar Total: 1 COWS:    Allergies: No Known Allergies  Home Medications:  (Not in a hospital admission)  OB/GYN Status:  No LMP for male patient.  General Assessment Data [facility] of Assessment: [facility] TTS Assessment: In system Is this a Tele or Face-to-Face Assessment?: Face-to-Face Is this an Initial Assessment or a Re-assessment for this encounter?: Initial Assessment Marital status: Single Maiden name: NA Is patient pregnant?: No Pregnancy Status: No Living Arrangements: Spouse/significant other Can pt return to current living arrangement?: Yes Admission Status: Voluntary Is patient capable of signing voluntary admission?: Yes Referral Source: Self/Family/Friend Insurance type: [place] [facility]  Medical Screening Exam [facility] Walk-in ONLY) Medical Exam completed: Yes  Crisis Care Plan Living Arrangements: Spouse/significant other Legal Guardian:  (NA) Name of Psychiatrist: None Name of Therapist: None  Education Status Is patient currently in school?: No Current Grade:  (NA) Highest grade of school patient has completed: 12 Name of school: NA Contact person: NA  Risk to self with the past 6 months Suicidal Ideation: Yes-Currently Present Has patient been a risk to self within the past 6 months prior to admission? : No Suicidal Intent: No Has patient had any suicidal intent within the past 6 months prior to admission? : No Is patient at risk for suicide?: Yes Suicidal Plan?: No Has patient had any suicidal plan within the past 6 months prior to admission? : No Access to Means: No What has been your use of drugs/alcohol within the last 12 months?: Current  use Previous Attempts/Gestures: Yes How many times?: 1 Other Self Harm Risks: NA Triggers for Past Attempts: Unknown Intentional Self Injurious Behavior: Cutting Comment - Self Injurious Behavior: Pt has past hx of cutting Family Suicide History: No Recent stressful life event(s): Other (Comment) (Family problems) Persecutory voices/beliefs?: No Depression: Yes Depression Symptoms: Feeling worthless/self pity Substance abuse history and/or treatment for substance abuse?: Yes Suicide prevention information given to non-admitted patients: Not applicable  Risk to Others within the past 6 months Homicidal Ideation: No Does patient have any lifetime risk of violence toward others beyond the six months prior to admission? : No Thoughts of Harm to Others: No Current Homicidal Intent: No Current Homicidal Plan: No Access to Homicidal Means: No Identified Victim: NA History of harm to others?: No Assessment of Violence: None Noted Violent Behavior Description: NA Does patient have access to weapons?: No Criminal Charges Pending?: No Does patient have a court date: No Is patient on probation?: No  Psychosis Hallucinations: None noted, Auditory Delusions: None noted  Mental Status Report Appearance/Hygiene: In scrubs Eye Contact: Fair Motor Activity: Unremarkable Speech: Soft, Slow Level of Consciousness: Quiet/awake Mood: Depressed Affect: Appropriate to circumstance Anxiety Level: Minimal Thought Processes: Coherent, Relevant Judgement: Unimpaired Orientation:  Person, Place, Time Obsessive Compulsive Thoughts/Behaviors: None  Cognitive Functioning Concentration: Normal Memory: Recent Intact, Remote Intact IQ: Average Insight: Fair Impulse Control: Fair Appetite: Good Weight Loss: 0 Weight Gain: 0 Sleep: No Change Total Hours of Sleep: 6 Vegetative Symptoms: None  ADLScreening [facility]) Patient's cognitive ability adequate to safely complete daily  activities?: Yes Patient able to express need for assistance with ADLs?: Yes Independently performs ADLs?: Yes (appropriate for developmental age)  Prior Inpatient Therapy Prior Inpatient Therapy: Yes Prior Therapy Dates: 2018 [facility] Reason for Treatment: MH issues  Prior Outpatient Therapy Prior Outpatient Therapy: No Prior Therapy Dates: NA Prior Therapy Facilty/Provider(s): NA Reason for Treatment: NA Does patient have an ACCT team?: No Does patient have Intensive In-House Services?  : No Does patient have [facility] services? : No Does patient have P4CC services?: No  ADL Screening (condition at time of admission) Patient's cognitive ability adequate to safely complete daily activities?: Yes Is the patient deaf or have difficulty hearing?: No Does the patient have difficulty seeing, even when wearing glasses/contacts?: No Does the patient have difficulty concentrating, remembering, or making decisions?: No Patient able to express need for assistance with ADLs?: Yes Does the patient have difficulty dressing or bathing?: No Independently performs ADLs?: Yes (appropriate for developmental age) Does the patient have difficulty walking or climbing stairs?: No Weakness of Legs: None Weakness of Arms/Hands: None  Home Assistive Devices/Equipment Home Assistive Devices/Equipment: None  Therapy Consults (therapy consults require a physician order) PT Evaluation Needed: No OT Evalulation Needed: No SLP Evaluation Needed: No Abuse/Neglect Assessment (Assessment to be complete while patient is alone) Physical Abuse: Denies Verbal Abuse: Denies Sexual Abuse: Denies Exploitation of patient/patient's resources: Denies Self-Neglect: Denies Values / Beliefs Cultural Requests During Hospitalization: None Spiritual Requests During Hospitalization: None Consults Spiritual Care Consult Needed: No Social Work Consult Needed: No Merchant navy officerAdvance Directives (For  Healthcare) Does Patient Have a Medical Advance Directive?: No Would patient like information on creating a medical advance directive?: No - Patient declined    Additional Information 1:1 In Past 12 Months?: No CIRT Risk: No Elopement Risk: No Does patient have medical clearance?: Yes     Disposition: Case was staffed with Shaune PollackLord DNP who recommended patient be re-evaluated in the a.m.      Disposition Initial Assessment Completed for this Encounter: Yes Disposition of Patient: Other dispositions Other disposition(s): Other (Comment) (Pt will be re-evaluated in the a.m.)  On Site Evaluation by:   Reviewed with Physician:    Alfredia Fergusonavid L Amisha Pospisil 01/31/2017 11:02 AM

## 2017-01-31 NOTE — ED Notes (Signed)
"  I need to go to rehab".  Vague SI.  Denies HI. POC discussed.

## 2017-01-31 NOTE — ED Notes (Addendum)
Patient c/o anxiety and "feeling weird".  PRN medications requested and received.  Will assess effectiveness and continue 15' checks for safety.

## 2017-01-31 NOTE — ED Notes (Signed)
Hourly rounding reveals patient sleeping in room. No complaints, stable, in no acute distress. Q15 minute rounds and monitoring via Security Cameras to continue. 

## 2017-01-31 NOTE — ED Provider Notes (Signed)
[facility]-EMERGENCY DEPT Provider Note   CSN: [id number] Arrival date & time: 01/31/17  7829     History   Chief Complaint Chief Complaint  Patient presents with  . Hallucinations  . Suicidal    HPI Brian Nolan is a 36 y.o. male with a history of schizophrenia, depression and substance abuse who presents today after flagging down [employer] and saying he was hearing voices that tell him to harm him self.  He reports previous admissions to [facility] and has not been taking any medication since January.  [employer] took a knife away from him.  He reports that he normally hears voices but over the past few days they have gotten "real bad."  The voices tell him to harm himself.  He reports he does not want to harm him self but fears he will due to the voices.   He denies wanting to harm others.  Denies recent actions or plans to harm himself or others.  Denies visual hallucinations.  He says that he drinks "a lot" but is unable to articulate how much or frequent he normally drinks.  He uses cocaine, last use yesterday, and occasionally uses mariajuana.  He reports the voices like it when he uses cocaine and they "calm down".    Denies physical pain or concerns.  No SOB, CP, abd pain, rashes, sore throat, HA, or visual changes. He reports he does not need a nicotine patch right now.   HPI  Past Medical History:  Diagnosis Date  . Depression   . Medical history non-contributory   . Schizophrenia (HCC)   . Substance abuse     Patient Active Problem List   Diagnosis Date Noted  . Cocaine dependence, continuous (HCC) 06/16/2016  . 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 Alcohol use disorder, severe, dependence (HCC) 04/25/2016  . Tobacco use disorder 04/25/2016  . Hyperlipidemia 10/12/2015  . MDD (major depressive disorder), recurrent episode, moderate (HCC) 10/11/2015  . Cannabis use disorder, mild, abuse 10/11/2015    Past Surgical History:    Procedure Laterality Date  . NO PAST SURGERIES         Home Medications    Prior to Admission medications   Medication Sig Start Date End Date Taking? Authorizing Provider  FLUoxetine (PROZAC) 20 MG capsule Take 1 capsule (20 mg total) by mouth daily. Patient not taking: Reported on 01/31/2017 06/18/16   Beau Fanny, FNP  gemfibrozil (LOPID) 600 MG tablet Take 1 tablet (600 mg total) by mouth 2 (two) times daily before a meal. Patient not taking: Reported on 01/31/2017 04/27/16   Jimmy Footman, MD  hydrOXYzine (ATARAX/VISTARIL) 25 MG tablet Take 1 tablet (25 mg total) by mouth every 6 (six) hours as needed (anxiety/agitation or CIWA < or = 10). Patient not taking: Reported on 01/31/2017 06/17/16   Beau Fanny, FNP  hydrOXYzine (ATARAX/VISTARIL) 25 MG tablet Take 3 tablets (75 mg total) by mouth at bedtime as needed (sleep). Patient not taking: Reported on 01/31/2017 06/17/16   Withrow, Everardo All, FNP  nicotine (NICODERM CQ - DOSED IN MG/24 HOURS) 21 mg/24hr patch Place 1 patch (21 mg total) onto the skin daily. Patient not taking: Reported on 01/31/2017 06/18/16   Beau Fanny, FNP  thiamine 100 MG tablet Take 1 tablet (100 mg total) by mouth daily. Patient not taking: Reported on 01/31/2017 06/18/16   Beau Fanny, FNP  traZODone (DESYREL) 50 MG tablet Take 1 tablet (50 mg total)  by mouth at bedtime as needed for sleep. Patient not taking: Reported on 01/31/2017 06/18/16   Adonis BrookAgustin, Sheila, NP    Family History Family History  Problem Relation Age of Onset  . Schizophrenia Maternal Uncle     Social History Social History  Substance Use Topics  . Smoking status: Current Every Day Smoker    Packs/day: 1.00    Years: 10.00    Types: Cigarettes  . Smokeless tobacco: Never Used  . Alcohol use 3.6 - 14.4 oz/week    6 - 24 Cans of beer per week     Allergies   Patient has no known allergies.   Review of Systems Review of Systems  Constitutional: Negative for  chills, diaphoresis, fatigue and fever.  HENT: Negative for ear pain and sore throat.   Eyes: Negative for pain and visual disturbance.  Respiratory: Negative for cough and shortness of breath.   Cardiovascular: Negative for chest pain and palpitations.  Gastrointestinal: Negative for abdominal pain, constipation, diarrhea, nausea and vomiting.  Genitourinary: Negative for dysuria, frequency and hematuria.  Musculoskeletal: Negative for arthralgias, back pain and neck pain.  Skin: Negative for color change and rash.  Neurological: Negative for seizures, syncope, light-headedness and headaches.  Psychiatric/Behavioral: Positive for hallucinations. Negative for self-injury and suicidal ideas (The voices tell him to harm him self but he doesn't want to harm himself).  All other systems reviewed and are negative.    Physical Exam Updated Vital Signs BP 123/88 (BP Location: Left Arm)   Pulse (!) 105   Temp 97.9 F (36.6 C) (Oral)   Resp 20   SpO2 98%   Physical Exam  Constitutional: He is oriented to person, place, and time. He appears well-developed and well-nourished. No distress.  HENT:  Head: Normocephalic and atraumatic.  Right Ear: External ear normal.  Left Ear: External ear normal.  Eyes: Conjunctivae are normal. Pupils are equal, round, and reactive to light. No scleral icterus.  Neck: Normal range of motion. No JVD present. No tracheal deviation present.  Cardiovascular: Normal rate, regular rhythm, normal heart sounds and intact distal pulses.  Exam reveals no gallop and no friction rub.   No murmur heard. Pulmonary/Chest: Effort normal and breath sounds normal. No stridor. No respiratory distress. He has no wheezes.  Abdominal: Soft. Bowel sounds are normal. He exhibits no distension. There is no tenderness.  Musculoskeletal: He exhibits no edema, tenderness or deformity.  Neurological: He is alert and oriented to person, place, and time. No sensory deficit. He exhibits  normal muscle tone.  Skin: Skin is warm and dry. He is not diaphoretic.  Psychiatric: His speech is normal. His affect is blunt. He is withdrawn and actively hallucinating. He is not agitated, not aggressive and not combative. He exhibits a depressed mood. He expresses no homicidal ideation. He expresses no homicidal plans.  Nursing note and vitals reviewed.    ED Treatments / Results  Labs (all labs ordered are listed, but only abnormal results are displayed) Labs Reviewed  COMPREHENSIVE METABOLIC PANEL - Abnormal; Notable for the following:       Result Value   Glucose, Bld 110 (*)    Total Bilirubin 1.6 (*)    All other components within normal limits  ETHANOL - Abnormal; Notable for the following:    Alcohol, Ethyl (B) 137 (*)    All other components within normal limits  ACETAMINOPHEN LEVEL - Abnormal; Notable for the following:    Acetaminophen (Tylenol), Serum <10 (*)  All other components within normal limits  CBC - Abnormal; Notable for the following:    WBC 13.1 (*)    All other components within normal limits  RAPID URINE DRUG SCREEN, HOSP PERFORMED - Abnormal; Notable for the following:    Cocaine POSITIVE (*)    All other components within normal limits  SALICYLATE LEVEL    EKG  EKG Interpretation None       Radiology No results found.  Procedures Procedures (including critical care time)  Medications Ordered in ED Medications  LORazepam (ATIVAN) injection 0-4 mg (not administered)    Or  LORazepam (ATIVAN) tablet 0-4 mg (not administered)  LORazepam (ATIVAN) injection 0-4 mg (not administered)    Or  LORazepam (ATIVAN) tablet 0-4 mg (not administered)  thiamine (VITAMIN B-1) tablet 100 mg (not administered)    Or  thiamine (B-1) injection 100 mg (not administered)     Initial Impression / Assessment and Plan / ED Course  I have reviewed the triage vital signs and the nursing notes.  Pertinent labs & imaging results that were available  during my care of the patient were reviewed by me and considered in my medical decision making (see chart for details).    Pt presents to the ED for Alcohol Intoxication or Abuse,Depression, Drug Abuse, Suicidal Ideation, and Psychosis. Pt is currently not suicidal however reports he fears he may give into the voices and harm him self.  The patients substances of abuse are alcohol and cocaine.   The patients behavior problems are Hallucinations, depression. The patient currently does not have any acute physical complaints and is in no acute distress. The patients demeanor is depressed, quite,  flat. The patient was brought to ED by [employer] The patient is here voluntarily.  Patient is medically clear pending TTS consult   He has not been taking any medications at home and thus no home meds have been ordered.     Final Clinical Impressions(s) / ED Diagnoses   Final diagnoses:  Hallucinations  Suicidal thoughts  Polysubstance abuse  Alcohol use    New Prescriptions New Prescriptions   No medications on file     Norman Clay 01/31/17 0940    Little, Ambrose Finland, MD 02/01/17 [phone]

## 2017-01-31 NOTE — ED Notes (Signed)
ED Provider at bedside. 

## 2017-01-31 NOTE — ED Notes (Signed)
Report to include situation, background, assessment and recommendations from LuAnn RN. Patient sleeping, respirations regular and unlabored. Q15 minute rounds and security camera observation to continue.  . 

## 2017-01-31 NOTE — ED Triage Notes (Addendum)
Pt brought in by [employer] voluntarily  [place] [employer] [place]ged him down and told him he was hearing voices   Pt requested to come to the hospital for help  Pt states he has not been on his meds since probably Jan  Pt states the voices are telling him to do bad things Pt states he has had thoughts of hurting himself but the police took his knife

## 2017-01-31 NOTE — BH Assessment (Signed)
[facility] Assessment Progress Note   Case was staffed with Lord DNP who recommended patient be re-evaluated in the a.m.    

## 2017-02-01 DIAGNOSIS — F330 Major depressive disorder, recurrent, mild: Secondary | ICD-10-CM | POA: Diagnosis present

## 2017-02-01 DIAGNOSIS — F1420 Cocaine dependence, uncomplicated: Secondary | ICD-10-CM | POA: Diagnosis not present

## 2017-02-01 DIAGNOSIS — F1010 Alcohol abuse, uncomplicated: Secondary | ICD-10-CM

## 2017-02-01 DIAGNOSIS — Z818 Family history of other mental and behavioral disorders: Secondary | ICD-10-CM

## 2017-02-01 DIAGNOSIS — F17210 Nicotine dependence, cigarettes, uncomplicated: Secondary | ICD-10-CM

## 2017-02-01 DIAGNOSIS — F1290 Cannabis use, unspecified, uncomplicated: Secondary | ICD-10-CM

## 2017-02-01 MED ORDER — TRAZODONE HCL 50 MG PO TABS: 50.0000 mg | ORAL_TABLET | Freq: Every evening | ORAL | 0 refills | Status: DC | PRN

## 2017-02-01 MED ORDER — HYDROXYZINE HCL 25 MG PO TABS: 25.0000 mg | ORAL_TABLET | Freq: Four times a day (QID) | ORAL | 0 refills | Status: DC | PRN

## 2017-02-01 MED ORDER — FLUOXETINE HCL 20 MG PO CAPS: 20.0000 mg | ORAL_CAPSULE | Freq: Every day | ORAL | 0 refills | Status: DC

## 2017-02-01 NOTE — ED Notes (Signed)
Hourly rounding reveals patient sleeping in room. No complaints, stable, in no acute distress. Q15 minute rounds and monitoring via Security Cameras to continue. 

## 2017-02-01 NOTE — Consult Note (Signed)
[facility] Consult   Reason for Consult:  Cocaine and alcohol abuse with suicidal ideations Referring Physician:  EDP Patient Identification: Brian Nolan MRN:  [id number] Principal Diagnosis: Major depressive disorder, recurrent episode, mild ([place]) Diagnosis:   Patient Active Problem List   Diagnosis Date Noted  . Alcohol abuse [F10.10] 02/01/2017    Priority: High  . Major depressive disorder, recurrent episode, mild ([place]) [F33.0] 02/01/2017    Priority: High  . Cocaine dependence, continuous ([place]) [F14.20] 06/16/2016    Priority: High  . Alcohol use disorder, severe, dependence ([place]) [F10.20] 04/25/2016  . Tobacco use disorder [F17.200] 04/25/2016  . Hyperlipidemia [E78.5] 10/12/2015  . Cannabis use disorder, mild, abuse [F12.10] 10/11/2015    Total Time spent with patient: 45 minutes  Subjective:   Brian Nolan is a 36 y.o. male patient does not warrant admission.  HPI:  36 yo male who came to the ED after using cocaine and alcohol with passive suicidal ideations and hallucinations.  He was restarted on medications yesterday and has stabilized.  No hallucinations or suicidal/homicidal ideations or withdrawal symptoms.  Discussed outpatient care and resources provided along with Rx.  Encouraged to refrain from substance abuse, ADS referral.  Stable for discharge.  Past Psychiatric History: substance abuse, depression  Risk to Self: Suicidal Ideation: Yes-Currently Present Suicidal Intent: No Is patient at risk for suicide?: Yes Suicidal Plan?: No Access to Means: No What has been your use of drugs/alcohol within the last 12 months?: Current use How many times?: 1 Other Self Harm Risks: NA Triggers for Past Attempts: Unknown Intentional Self Injurious Behavior: Cutting Comment - Self Injurious Behavior: Pt has past hx of cutting Risk to Others: Homicidal Ideation: No Thoughts of Harm to Others: No Current Homicidal Intent:  No Current Homicidal Plan: No Access to Homicidal Means: No Identified Victim: NA History of harm to others?: No Assessment of Violence: None Noted Violent Behavior Description: NA Does patient have access to weapons?: No Criminal Charges Pending?: No Does patient have a court date: No Prior Inpatient Therapy: Prior Inpatient Therapy: Yes Prior Therapy Dates: 2018 Prior Therapy Facilty/Provider(s): [facility] Reason for Treatment: MH issues Prior Outpatient Therapy: Prior Outpatient Therapy: No Prior Therapy Dates: NA Prior Therapy Facilty/Provider(s): NA Reason for Treatment: NA Does patient have an ACCT team?: No Does patient have Intensive In-House Services?  : No Does patient have [facility] services? : No Does patient have [facility] services?: No  Past Medical History:  Past Medical History:  Diagnosis Date  . Depression   . Medical history non-contributory   . Schizophrenia ([place])   . Substance abuse     Past Surgical History:  Procedure Laterality Date  . NO PAST SURGERIES     Family History:  Family History  Problem Relation Age of Onset  . Schizophrenia Maternal Uncle    Family Psychiatric  History: unknown Social History:  History  Alcohol Use  . 3.6 - 14.4 oz/week  . 6 - 24 Cans of beer per week     History  Drug Use  . Types: "Crack" cocaine, Marijuana    Social History   Social History  . Marital status: Single    Spouse name: N/A  . Number of children: N/A  . Years of education: N/A   Social History Main Topics  . Smoking status: Current Every Day Smoker    Packs/day: 1.00    Years: 10.00    Types: Cigarettes  . Smokeless tobacco: Never Used  .  Alcohol use 3.6 - 14.4 oz/week    6 - 24 Cans of beer per week  . Drug use: Yes    Types: "Crack" cocaine, Marijuana  . Sexual activity: Yes    Birth control/ protection: Condom   Other Topics Concern  . None   Social History Narrative  . None   Additional Social History:    Allergies:  No Known  Allergies  Labs:  Results for orders placed or performed during the hospital encounter of 01/31/17 (from the past 48 hour(s))  Rapid urine drug screen (hospital performed)     Status: Abnormal   Collection Time: 01/31/17  5:23 AM  Result Value Ref Range   Opiates NONE DETECTED NONE DETECTED   Cocaine POSITIVE (A) NONE DETECTED   Benzodiazepines NONE DETECTED NONE DETECTED   Amphetamines NONE DETECTED NONE DETECTED   Tetrahydrocannabinol NONE DETECTED NONE DETECTED   Barbiturates NONE DETECTED NONE DETECTED    Comment:        DRUG SCREEN FOR MEDICAL PURPOSES ONLY.  IF CONFIRMATION IS NEEDED FOR ANY PURPOSE, NOTIFY LAB WITHIN 5 DAYS.        LOWEST DETECTABLE LIMITS FOR URINE DRUG SCREEN Drug Class       Cutoff (ng/mL) Amphetamine      1000 Barbiturate      200 Benzodiazepine   546 Tricyclics       270 Opiates          300 Cocaine          300 THC              50   Comprehensive metabolic panel     Status: Abnormal   Collection Time: 01/31/17  5:42 AM  Result Value Ref Range   Sodium 139 135 - 145 mmol/L   Potassium 4.2 3.5 - 5.1 mmol/L   Chloride 103 101 - 111 mmol/L   CO2 25 22 - 32 mmol/L   Glucose, Bld 110 (H) 65 - 99 mg/dL   BUN 10 6 - 20 mg/dL   Creatinine, Ser 1.07 0.61 - 1.24 mg/dL   Calcium 8.9 8.9 - 10.3 mg/dL   Total Protein 7.2 6.5 - 8.1 g/dL   Albumin 4.0 3.5 - 5.0 g/dL   AST 40 15 - 41 U/L   ALT 30 17 - 63 U/L   Alkaline Phosphatase 65 38 - 126 U/L   Total Bilirubin 1.6 (H) 0.3 - 1.2 mg/dL   GFR calc non Af Amer >60 >60 mL/min   GFR calc Af Amer >60 >60 mL/min    Comment: (NOTE) The eGFR has been calculated using the CKD EPI equation. This calculation has not been validated in all clinical situations. eGFR's persistently <60 mL/min signify possible Chronic Kidney Disease.    Anion gap 11 5 - 15  Ethanol     Status: Abnormal   Collection Time: 01/31/17  5:42 AM  Result Value Ref Range   Alcohol, Ethyl (B) 137 (H) <5 mg/dL    Comment:         LOWEST DETECTABLE LIMIT FOR SERUM ALCOHOL IS 5 mg/dL FOR MEDICAL PURPOSES ONLY   Salicylate level     Status: None   Collection Time: 01/31/17  5:42 AM  Result Value Ref Range   Salicylate Lvl <3.5 2.8 - 30.0 mg/dL  Acetaminophen level     Status: Abnormal   Collection Time: 01/31/17  5:42 AM  Result Value Ref Range   Acetaminophen (Tylenol), Serum <10 (L) 10 - 30  ug/mL    Comment:        THERAPEUTIC CONCENTRATIONS VARY SIGNIFICANTLY. A RANGE OF 10-30 ug/mL MAY BE AN EFFECTIVE CONCENTRATION FOR MANY PATIENTS. HOWEVER, SOME ARE BEST TREATED AT CONCENTRATIONS OUTSIDE THIS RANGE. ACETAMINOPHEN CONCENTRATIONS >150 ug/mL AT 4 HOURS AFTER INGESTION AND >50 ug/mL AT 12 HOURS AFTER INGESTION ARE OFTEN ASSOCIATED WITH TOXIC REACTIONS.   cbc     Status: Abnormal   Collection Time: 01/31/17  5:42 AM  Result Value Ref Range   WBC 13.1 (H) 4.0 - 10.5 K/uL   RBC 5.31 4.22 - 5.81 MIL/uL   Hemoglobin 16.0 13.0 - 17.0 g/dL   HCT 45.2 39.0 - 52.0 %   MCV 85.1 78.0 - 100.0 fL   MCH 30.1 26.0 - 34.0 pg   MCHC 35.4 30.0 - 36.0 g/dL   RDW 14.3 11.5 - 15.5 %   Platelets 264 150 - 400 K/uL    Current Facility-Administered Medications  Medication Dose Route Frequency Provider Last Rate Last Dose  . FLUoxetine (PROZAC) capsule 10 mg  10 mg Oral Daily Patrecia Pour, NP   10 mg at 01/31/17 1445  . hydrOXYzine (ATARAX/VISTARIL) tablet 25 mg  25 mg Oral Q6H PRN Patrecia Pour, NP      . LORazepam (ATIVAN) tablet 1 mg  1 mg Oral Q6H PRN Patrecia Pour, NP   1 mg at 01/31/17 1447  . traZODone (DESYREL) tablet 50 mg  50 mg Oral QHS PRN Patrecia Pour, NP       Current Outpatient Prescriptions  Medication Sig Dispense Refill  . FLUoxetine (PROZAC) 20 MG capsule Take 1 capsule (20 mg total) by mouth daily. (Patient not taking: Reported on 01/31/2017) 30 capsule 0  . gemfibrozil (LOPID) 600 MG tablet Take 1 tablet (600 mg total) by mouth 2 (two) times daily before a meal. (Patient not taking:  Reported on 01/31/2017) 60 tablet 0  . hydrOXYzine (ATARAX/VISTARIL) 25 MG tablet Take 1 tablet (25 mg total) by mouth every 6 (six) hours as needed (anxiety/agitation or CIWA < or = 10). (Patient not taking: Reported on 01/31/2017) 30 tablet 0  . hydrOXYzine (ATARAX/VISTARIL) 25 MG tablet Take 3 tablets (75 mg total) by mouth at bedtime as needed (sleep). (Patient not taking: Reported on 01/31/2017) 90 tablet 0  . nicotine (NICODERM CQ - DOSED IN MG/24 HOURS) 21 mg/24hr patch Place 1 patch (21 mg total) onto the skin daily. (Patient not taking: Reported on 01/31/2017) 28 patch 0  . thiamine 100 MG tablet Take 1 tablet (100 mg total) by mouth daily. (Patient not taking: Reported on 01/31/2017) 30 tablet 0  . traZODone (DESYREL) 50 MG tablet Take 1 tablet (50 mg total) by mouth at bedtime as needed for sleep. (Patient not taking: Reported on 01/31/2017) 30 tablet 0    Musculoskeletal: Strength & Muscle Tone: within normal limits Gait & Station: normal Patient leans: N/A  Psychiatric Specialty Exam: Physical Exam  Constitutional: He is oriented to person, place, and time. He appears well-developed and well-nourished.  HENT:  Head: Normocephalic.  Neck: Normal range of motion.  Respiratory: Effort normal.  Musculoskeletal: Normal range of motion.  Neurological: He is alert and oriented to person, place, and time.  Psychiatric: His speech is normal and behavior is normal. Judgment and thought content normal. Cognition and memory are normal. He exhibits a depressed mood.    Review of Systems  Psychiatric/Behavioral: Positive for depression and substance abuse.  All other systems reviewed and are negative.  Blood pressure 125/81, pulse 78, temperature 98.3 F (36.8 C), temperature source Oral, resp. rate 16, SpO2 98 %.There is no height or weight on file to calculate BMI.  General Appearance: Casual  Eye Contact:  Good  Speech:  Normal Rate  Volume:  Normal  Mood:  Depressed, mild  Affect:   Congruent  Thought Process:  Coherent and Descriptions of Associations: Intact  Orientation:  Full (Time, Place, and Person)  Thought Content:  WDL and Logical  Suicidal Thoughts:  No  Homicidal Thoughts:  No  Memory:  Immediate;   Good Recent;   Good Remote;   Good  Judgement:  Fair  Insight:  Fair  Psychomotor Activity:  Normal  Concentration:  Concentration: Good and Attention Span: Good  Recall:  Good  Fund of Knowledge:  Fair  Language:  Good  Akathisia:  No  Handed:  Right  AIMS (if indicated):     Assets:  Intimacy Leisure Time Physical Health Resilience Social Support  ADL's:  Intact  Cognition:  WNL  Sleep:        Treatment Plan Summary: Daily contact with patient to assess and evaluate symptoms and progress in treatment, Medication management and Plan major depressive disorder, recurrent, mild:  -Crisis stabilization -Medication management:  Prozac started at 10 mg daily for depression increased to 20 mg, Vistaril every 6 hours PRN anxiety, Trazodone 50 mg at bedtime for sleep; Ativan 1 mg every six hours only during hospitalization to cover any possible withdrawal symptoms--none needed at discharge - stable -Individual and substance abuse counseling -Rx provided along with outpatient resources  Disposition: No evidence of imminent risk to self or others at present.    Waylan Boga, NP 02/01/2017 10:53 AM

## 2017-02-01 NOTE — Discharge Instructions (Signed)
For your ongoing mental health needs, you are advised to follow up with [facility]. Walk in hours are Monday - Friday: 8:30am-12:00pm / 1:00pm-2:30pm. ° °[facility] °[address] °[place], [place] [address] °[phone] °

## 2017-02-01 NOTE — [facility] Suicide Risk Assessment (Signed)
Suicide Risk Assessment  Discharge Assessment   [facility]   Principal Problem: Major depressive disorder, recurrent episode, mild ([facility]) Discharge Diagnoses:  Patient Active Problem List   Diagnosis Date Noted  . Alcohol abuse [F10.10] 02/01/2017    Priority: High  . Major depressive disorder, recurrent episode, mild ([facility]) [F33.0] 02/01/2017    Priority: High  . Cocaine dependence, continuous ([facility]) [F14.20] 06/16/2016    Priority: High  . Alcohol use disorder, severe, dependence ([facility]) [F10.20] 04/25/2016  . Tobacco use disorder [F17.200] 04/25/2016  . Hyperlipidemia [E78.5] 10/12/2015  . Cannabis use disorder, mild, abuse [F12.10] 10/11/2015    Total Time spent with patient: 45 minutes  Musculoskeletal: Strength & Muscle Tone: within normal limits Gait & Station: normal Patient leans: N/A  Psychiatric Specialty Exam: Physical Exam  Constitutional: He is oriented to person, place, and time. He appears well-developed and well-nourished.  HENT:  Head: Normocephalic.  Neck: Normal range of motion.  Respiratory: Effort normal.  Musculoskeletal: Normal range of motion.  Neurological: He is alert and oriented to person, place, and time.  Psychiatric: His speech is normal and behavior is normal. Judgment and thought content normal. Cognition and memory are normal. He exhibits a depressed mood.    Review of Systems  Psychiatric/Behavioral: Positive for depression and substance abuse.  All other systems reviewed and are negative.   Blood pressure 125/81, pulse 78, temperature 98.3 F (36.8 C), temperature source Oral, resp. rate 16, SpO2 98 %.There is no height or weight on file to calculate BMI.  General Appearance: Casual  Eye Contact:  Good  Speech:  Normal Rate  Volume:  Normal  Mood:  Depressed, mild  Affect:  Congruent  Thought Process:  Coherent and Descriptions of Associations: Intact  Orientation:  Full (Time, Place, and Person)  Thought  Content:  WDL and Logical  Suicidal Thoughts:  No  Homicidal Thoughts:  No  Memory:  Immediate;   Good Recent;   Good Remote;   Good  Judgement:  Fair  Insight:  Fair  Psychomotor Activity:  Normal  Concentration:  Concentration: Good and Attention Span: Good  Recall:  Good  Fund of Knowledge:  Fair  Language:  Good  Akathisia:  No  Handed:  Right  AIMS (if indicated):     Assets:  Intimacy Leisure Time Physical Health Resilience Social Support  ADL's:  Intact  Cognition:  WNL  Sleep:      Mental Status Per Nursing Assessment::   On Admission:   cocaine and alcohol abuse with suicidal ideations  Demographic Factors:  Male  Loss Factors: NA  Historical Factors: NA  Risk Reduction Factors:   Sense of responsibility to family and Positive social support  Continued Clinical Symptoms:  Depression, mild  Cognitive Features That Contribute To Risk:  None    Suicide Risk:  Minimal: No identifiable suicidal ideation.  Patients presenting with no risk factors but with morbid ruminations; may be classified as minimal risk based on the severity of the depressive symptoms    Plan Of Care/Follow-up recommendations:  Activity:  as tolerated Diet:  heart healthy diet  Hosea Hanawalt, NP 02/01/2017, 10:56 AM

## 2017-02-01 NOTE — ED Notes (Signed)
All d/c instructions reviewed.  Bus pass given. Denies SI/HI. NAD.

## 2017-05-15 ENCOUNTER — Telehealth: Payer: Self-pay | Admitting: Pharmacy Technician

## 2017-05-15 NOTE — Telephone Encounter (Signed)
Patient eligible to receive medication assistance at [facility] through 2018, as long as eligibility requirements continue to be met.  Brian Nolan J. [name] Care Manager [facility] 

## 2017-06-04 ENCOUNTER — Ambulatory Visit: Payer: Self-pay

## 2017-06-11 ENCOUNTER — Ambulatory Visit: Payer: Self-pay

## 2017-06-30 ENCOUNTER — Ambulatory Visit: Payer: Self-pay

## 2018-05-24 ENCOUNTER — Encounter (HOSPITAL_COMMUNITY): Payer: Self-pay | Admitting: Emergency Medicine

## 2018-05-24 ENCOUNTER — Emergency Department (HOSPITAL_COMMUNITY): Payer: Self-pay

## 2018-05-24 DIAGNOSIS — R05 Cough: Secondary | ICD-10-CM | POA: Insufficient documentation

## 2018-05-24 DIAGNOSIS — Z5321 Procedure and treatment not carried out due to patient leaving prior to being seen by health care provider: Secondary | ICD-10-CM | POA: Insufficient documentation

## 2018-05-24 NOTE — ED Triage Notes (Signed)
Pt c/o SOB, cough with brown phlegm since last MOnday week ago. reports that when he breaths sound like a car motor. Repots that he was Mucinex at first then stopped taking bc had dry cough.

## 2018-05-25 ENCOUNTER — Encounter (HOSPITAL_COMMUNITY): Payer: Self-pay

## 2018-05-25 ENCOUNTER — Emergency Department (HOSPITAL_COMMUNITY): Payer: Self-pay

## 2018-05-25 ENCOUNTER — Emergency Department (HOSPITAL_COMMUNITY)
Admission: EM | Admit: 2018-05-25 | Discharge: 2018-05-25 | Disposition: A | Discharge status: Home / Self Care | Payer: Self-pay | Attending: Emergency Medicine | Admitting: Emergency Medicine

## 2018-05-25 ENCOUNTER — Observation Stay (HOSPITAL_COMMUNITY)
Admission: EM | Admit: 2018-05-25 | Discharge: 2018-05-26 | Disposition: A | Discharge status: Home / Self Care | Payer: Self-pay | Attending: Internal Medicine | Admitting: Internal Medicine

## 2018-05-25 ENCOUNTER — Other Ambulatory Visit: Payer: Self-pay

## 2018-05-25 DIAGNOSIS — F1421 Cocaine dependence, in remission: Secondary | ICD-10-CM

## 2018-05-25 DIAGNOSIS — F329 Major depressive disorder, single episode, unspecified: Secondary | ICD-10-CM

## 2018-05-25 DIAGNOSIS — F330 Major depressive disorder, recurrent, mild: Secondary | ICD-10-CM | POA: Insufficient documentation

## 2018-05-25 DIAGNOSIS — F1011 Alcohol abuse, in remission: Secondary | ICD-10-CM

## 2018-05-25 DIAGNOSIS — G4700 Insomnia, unspecified: Secondary | ICD-10-CM

## 2018-05-25 DIAGNOSIS — J189 Pneumonia, unspecified organism: Principal | ICD-10-CM | POA: Diagnosis present

## 2018-05-25 DIAGNOSIS — Z87891 Personal history of nicotine dependence: Secondary | ICD-10-CM | POA: Insufficient documentation

## 2018-05-25 DIAGNOSIS — F209 Schizophrenia, unspecified: Secondary | ICD-10-CM

## 2018-05-25 DIAGNOSIS — F32A Depression, unspecified: Secondary | ICD-10-CM

## 2018-05-25 DIAGNOSIS — E785 Hyperlipidemia, unspecified: Secondary | ICD-10-CM | POA: Diagnosis present

## 2018-05-25 LAB — CBC WITH DIFFERENTIAL/PLATELET
BASOS PCT: 1 %
Basophils Absolute: 0.1 10*3/uL (ref 0.0–0.1)
EOS PCT: 2 %
Eosinophils Absolute: 0.3 10*3/uL (ref 0.0–0.7)
HCT: 41.9 % (ref 39.0–52.0)
Hemoglobin: 13.8 g/dL (ref 13.0–17.0)
LYMPHS ABS: 2.5 10*3/uL (ref 0.7–4.0)
Lymphocytes Relative: 19 %
MCH: 29.4 pg (ref 26.0–34.0)
MCHC: 32.9 g/dL (ref 30.0–36.0)
MCV: 89.3 fL (ref 78.0–100.0)
MONO ABS: 1.4 10*3/uL — AB (ref 0.1–1.0)
Monocytes Relative: 11 %
NEUTROS ABS: 8.8 10*3/uL — AB (ref 1.7–7.7)
Neutrophils Relative %: 67 %
PLATELETS: 302 10*3/uL (ref 150–400)
RBC: 4.69 MIL/uL (ref 4.22–5.81)
RDW: 12.7 % (ref 11.5–15.5)
WBC: 13.1 10*3/uL — AB (ref 4.0–10.5)

## 2018-05-25 LAB — BRAIN NATRIURETIC PEPTIDE: B Natriuretic Peptide: 36.8 pg/mL (ref 0.0–100.0)

## 2018-05-25 LAB — BASIC METABOLIC PANEL
Anion gap: 10 (ref 5–15)
BUN: 7 mg/dL (ref 6–20)
CALCIUM: 8.5 mg/dL — AB (ref 8.9–10.3)
CO2: 25 mmol/L (ref 22–32)
Chloride: 103 mmol/L (ref 98–111)
Creatinine, Ser: 0.92 mg/dL (ref 0.61–1.24)
GFR calc Af Amer: >60 mL/min (ref >60)
GLUCOSE: 116 mg/dL — AB (ref 70–99)
Potassium: 3.8 mmol/L (ref 3.5–5.1)
Sodium: 138 mmol/L (ref 135–145)

## 2018-05-25 LAB — I-STAT TROPONIN, ED: Troponin i, poc: 0 ng/mL (ref 0.00–0.08)

## 2018-05-25 LAB — INFLUENZA PANEL BY PCR (TYPE A & B)
INFLAPCR: NEGATIVE
Influenza B By PCR: NEGATIVE

## 2018-05-25 LAB — I-STAT CG4 LACTIC ACID, ED
Lactic Acid, Venous: 1.97 mmol/L — ABNORMAL HIGH (ref 0.5–1.9)
Lactic Acid, Venous: 1.07 mmol/L (ref 0.5–1.9)

## 2018-05-25 LAB — D-DIMER, QUANTITATIVE: D-Dimer, Quant: 1.74 ug/mL-FEU — ABNORMAL HIGH (ref 0.00–0.50)

## 2018-05-25 MED ORDER — SODIUM CHLORIDE 0.9 % IV SOLN
1.0000 g | Freq: Once | INTRAVENOUS | Status: AC
  Administered 2018-05-25: 1 g via INTRAVENOUS
  Filled 2018-05-25: qty 10

## 2018-05-25 MED ORDER — SODIUM CHLORIDE 0.9 % IV SOLN
500.0000 mg | Freq: Once | INTRAVENOUS | Status: AC
  Administered 2018-05-25: 500 mg via INTRAVENOUS
  Filled 2018-05-25: qty 500

## 2018-05-25 MED ORDER — SODIUM CHLORIDE 0.9 % IV SOLN
500.0000 mg | INTRAVENOUS | Status: DC
  Filled 2018-05-25: qty 500

## 2018-05-25 MED ORDER — ONDANSETRON HCL 4 MG/2ML IJ SOLN: 4.0000 mg | Freq: Four times a day (QID) | INTRAMUSCULAR | Status: DC | PRN

## 2018-05-25 MED ORDER — GUAIFENESIN ER 600 MG PO TB12
600.0000 mg | ORAL_TABLET | Freq: Two times a day (BID) | ORAL | Status: DC
  Administered 2018-05-25 – 2018-05-26 (×2): 600 mg via ORAL
  Filled 2018-05-25 (×2): qty 1

## 2018-05-25 MED ORDER — ACETAMINOPHEN 325 MG PO TABS: 650.0000 mg | ORAL_TABLET | Freq: Four times a day (QID) | ORAL | Status: DC | PRN

## 2018-05-25 MED ORDER — IPRATROPIUM-ALBUTEROL 0.5-2.5 (3) MG/3ML IN SOLN
3.0000 mL | Freq: Once | RESPIRATORY_TRACT | Status: AC
  Administered 2018-05-25: 3 mL via RESPIRATORY_TRACT
  Filled 2018-05-25: qty 3

## 2018-05-25 MED ORDER — SODIUM CHLORIDE 0.9 % IV BOLUS
500.0000 mL | Freq: Once | INTRAVENOUS | Status: AC
  Administered 2018-05-25: 500 mL via INTRAVENOUS

## 2018-05-25 MED ORDER — SODIUM CHLORIDE 0.9 % IV SOLN
1.0000 g | INTRAVENOUS | Status: DC
  Filled 2018-05-25: qty 10

## 2018-05-25 MED ORDER — BENZONATATE 100 MG PO CAPS
100.0000 mg | ORAL_CAPSULE | Freq: Once | ORAL | Status: AC
  Administered 2018-05-25: 100 mg via ORAL
  Filled 2018-05-25: qty 1

## 2018-05-25 MED ORDER — SODIUM CHLORIDE 0.9 % IV BOLUS
1000.0000 mL | Freq: Once | INTRAVENOUS | Status: AC
  Administered 2018-05-25: 1000 mL via INTRAVENOUS

## 2018-05-25 MED ORDER — ONDANSETRON HCL 4 MG PO TABS: 4.0000 mg | ORAL_TABLET | Freq: Four times a day (QID) | ORAL | Status: DC | PRN

## 2018-05-25 MED ORDER — ALBUTEROL SULFATE HFA 108 (90 BASE) MCG/ACT IN AERS
2.0000 | INHALATION_SPRAY | Freq: Once | RESPIRATORY_TRACT | Status: AC
  Administered 2018-05-25: 2 via RESPIRATORY_TRACT
  Filled 2018-05-25: qty 6.7

## 2018-05-25 MED ORDER — ALBUTEROL SULFATE (2.5 MG/3ML) 0.083% IN NEBU: 2.5000 mg | INHALATION_SOLUTION | RESPIRATORY_TRACT | Status: DC | PRN

## 2018-05-25 MED ORDER — IOPAMIDOL (ISOVUE-370) INJECTION 76%
100.0000 mL | Freq: Once | INTRAVENOUS | Status: AC | PRN
  Administered 2018-05-25: 100 mL via INTRAVENOUS

## 2018-05-25 MED ORDER — IOPAMIDOL (ISOVUE-370) INJECTION 76%
INTRAVENOUS | Status: AC
  Filled 2018-05-25: qty 100

## 2018-05-25 MED ORDER — ACETAMINOPHEN 650 MG RE SUPP: 650.0000 mg | Freq: Four times a day (QID) | RECTAL | Status: DC | PRN

## 2018-05-25 MED ORDER — ENOXAPARIN SODIUM 40 MG/0.4ML ~~LOC~~ SOLN
40.0000 mg | SUBCUTANEOUS | Status: DC
  Administered 2018-05-25: 40 mg via SUBCUTANEOUS
  Filled 2018-05-25: qty 0.4

## 2018-05-25 MED ORDER — ALBUTEROL SULFATE (2.5 MG/3ML) 0.083% IN NEBU
2.5000 mg | INHALATION_SOLUTION | RESPIRATORY_TRACT | Status: DC
  Administered 2018-05-26 (×3): 2.5 mg via RESPIRATORY_TRACT
  Filled 2018-05-25 (×3): qty 3

## 2018-05-25 NOTE — ED Triage Notes (Signed)
Pt states cough, shortness of breath, chest congestion and pain with coughing X1 week. Pt states SOB with exertion.

## 2018-05-25 NOTE — ED Provider Notes (Signed)
[facility] EMERGENCY DEPARTMENT Provider Note   CSN: [id number] Arrival date & time: 05/25/18  0940     History   Chief Complaint Chief Complaint  Patient presents with  . Cough    HPI Brian Nolan is a 37 y.o. male.  HPI   Patient is a 37 year old male with history of depression, schizophrenia, substance abuse, alcohol abuse who presents emergency department today complaining of rhinorrhea, nasal congestion, productive cough with yellow sputum for the last week.  States that symptoms of rhinorrhea, nasal congestion, sinus pressure began 1 week ago and the cough began about 5 days ago.  Denies fevers or chills.  States that he has been getting fatigued more with ambulation and feels somewhat short of breath.  No chest pain, diaphoresis, dizziness, lightheadedness.  No lower extremity swelling.  Denies leg pain/swelling, hemoptysis, recent surgery/trauma, recent long travel, hormone use, personal hx of cancer, or hx of DVT/PE.   Patient denies any drug or alcohol use in the last 3 months.  Denies tobacco use in the last 3 months.  Past Medical History:  Diagnosis Date  . Depression   . Medical history non-contributory   . Schizophrenia (HCC)   . Substance abuse [facility])     Patient Active Problem List   Diagnosis Date Noted  . Alcohol abuse 02/01/2017  . 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 Cocaine dependence, continuous (HCC) 06/16/2016  . Alcohol use disorder, severe, dependence (HCC) 04/25/2016  . Tobacco use disorder 04/25/2016  . Hyperlipidemia 10/12/2015  . Cannabis use disorder, mild, abuse 10/11/2015    Past Surgical History:  Procedure Laterality Date  . NO PAST SURGERIES          Home Medications    Prior to Admission medications   Medication Sig Start Date End Date Taking? Authorizing Provider  guaiFENesin (ROBITUSSIN) 100 MG/5ML liquid Take 200 mg by mouth 3 (three) times daily as  needed for cough.   Yes [provider]  FLUoxetine (PROZAC) 20 MG capsule Take 1 capsule (20 mg total) by mouth daily. 02/01/17   Charm Rings, NP  gemfibrozil (LOPID) 600 MG tablet Take 1 tablet (600 mg total) by mouth 2 (two) times daily before a meal. Patient not taking: Reported on 01/31/2017 04/27/16   Jimmy Footman, MD  hydrOXYzine (ATARAX/VISTARIL) 25 MG tablet Take 3 tablets (75 mg total) by mouth at bedtime as needed (sleep). Patient not taking: Reported on 01/31/2017 06/17/16   Beau Fanny, FNP  hydrOXYzine (ATARAX/VISTARIL) 25 MG tablet Take 1 tablet (25 mg total) by mouth every 6 (six) hours as needed (anxiety/agitation or CIWA < or = 10). 02/01/17   Lord, Herminio Heads, NP  nicotine (NICODERM CQ - DOSED IN MG/24 HOURS) 21 mg/24hr patch Place 1 patch (21 mg total) onto the skin daily. Patient not taking: Reported on 01/31/2017 06/18/16   Beau Fanny, FNP  thiamine 100 MG tablet Take 1 tablet (100 mg total) by mouth daily. Patient not taking: Reported on 01/31/2017 06/18/16   Beau Fanny, FNP  traZODone (DESYREL) 50 MG tablet Take 1 tablet (50 mg total) by mouth at bedtime as needed for sleep. 02/01/17   Charm Rings, NP    Family History Family History  Problem Relation Age of Onset  . Schizophrenia Maternal Uncle     Social History Social History   Tobacco Use  . Smoking status: Former Smoker    Packs/day: 1.00    Years:  10.00    Pack years: 10.00    Types: Cigarettes    Last attempt to quit: 01/30/2018    Years since quitting: 0.3  . Smokeless tobacco: Never Used  Substance Use Topics  . Alcohol use: Yes    Alcohol/week: 6.0 - 24.0 standard drinks    Types: 6 - 24 Cans of beer per week  . Drug use: Yes    Types: "Crack" cocaine, Marijuana     Allergies   Patient has no known allergies.   Review of Systems Review of Systems  Constitutional: Negative for chills and fever.  HENT: Positive for congestion, postnasal drip, rhinorrhea, sinus  pressure and sinus pain. Negative for ear pain and sore throat.   Eyes: Negative for pain and visual disturbance.  Respiratory: Positive for cough and shortness of breath.   Cardiovascular: Negative for chest pain, palpitations and leg swelling.  Gastrointestinal: Negative for abdominal pain, constipation, diarrhea, nausea and vomiting.  Genitourinary: Negative for dysuria and hematuria.  Musculoskeletal: Negative for back pain and neck pain.  Skin: Negative for color change and rash.  Neurological: Negative for dizziness, seizures, syncope, weakness, light-headedness, numbness and headaches.  All other systems reviewed and are negative.  Physical Exam Updated Vital Signs BP 114/67   Pulse (!) 104   Temp 98.6 F (37 C) (Oral)   Resp (!) 24   SpO2 95%   Physical Exam  Constitutional: He appears well-developed and well-nourished.  Well-appearing.  Wet cough on exam.  HENT:  Head: Normocephalic and atraumatic.  Bilateral TMs without erythema or effusion.  No pharyngeal erythema, tonsillar swelling or exudates.  Uvula midline.  Eyes: Conjunctivae are normal.  Neck: Neck supple.  Cardiovascular: Normal rate, regular rhythm, normal heart sounds and intact distal pulses.  No murmur heard. Pulmonary/Chest: Effort normal.  No tachypnea, speaking in full sentences in no acute distress.  Coarse breath sounds throughout the right lung with wheezing as well.  Abdominal: Soft. Bowel sounds are normal. He exhibits no distension. There is no tenderness. There is no guarding.  Musculoskeletal:  No calf TTP, erythema, swelling.  Lymphadenopathy:    He has no cervical adenopathy.  Neurological: He is alert.  Skin: Skin is warm and dry. Capillary refill takes less than 2 seconds.  Psychiatric: He has a normal mood and affect.  Nursing note and vitals reviewed.    ED Treatments / Results  Labs (all labs ordered are listed, but only abnormal results are displayed) Labs Reviewed  CBC WITH  DIFFERENTIAL/PLATELET - Abnormal; Notable for the following components:      Result Value   WBC 13.1 (*)    Neutro Abs 8.8 (*)    Monocytes Absolute 1.4 (*)    All other components within normal limits  BASIC METABOLIC PANEL - Abnormal; Notable for the following components:   Glucose, Bld 116 (*)    Calcium 8.5 (*)    All other components within normal limits  D-DIMER, QUANTITATIVE (NOT AT [facility]) - Abnormal; Notable for the following components:   D-Dimer, Quant 1.74 (*)    All other components within normal limits  I-STAT CG4 LACTIC ACID, ED - Abnormal; Notable for the following components:   Lactic Acid, Venous 1.97 (*)    All other components within normal limits  CULTURE, BLOOD (ROUTINE X 2)  CULTURE, BLOOD (ROUTINE X 2)  BRAIN NATRIURETIC PEPTIDE  INFLUENZA PANEL BY PCR (TYPE A & B)  I-STAT TROPONIN, ED  I-STAT CG4 LACTIC ACID, ED    EKG  None  Radiology Dg Chest 2 View  Result Date: 05/25/2018 CLINICAL DATA:  Shortness of breath and chest pain EXAM: CHEST - 2 VIEW COMPARISON:  May 24, 2018 FINDINGS: There is somewhat less opacity in the right middle lobe compared to 1 day prior. There does remain opacity along the more inferolateral aspect of the right middle lobe currently. There is also subtle increased opacity in the inferior lingula, stable from 1 day prior. Lungs elsewhere are clear. Heart size and pulmonary vascularity normal. No adenopathy. No bone lesions. IMPRESSION: Less airspace opacity in the right middle lobe, consistent with partial but incomplete clearing of pneumonia in this area. Stable apparently small area of infiltrate in the inferior lingula. The apparent right middle lobe and inferior lingular infiltrates appear superimposed on current lateral image. Lungs elsewhere clear. No adenopathy. Electronically Signed   By: Bretta Bang III M.D.   On: 05/25/2018 11:22   Dg Chest 2 View  Result Date: 05/24/2018 CLINICAL DATA:  Shortness of breath with  cough. EXAM: CHEST - 2 VIEW COMPARISON:  06/01/2016 FINDINGS: Lungs are adequately inflated with hazy opacification over the right middle lobe which may represent early infection. No effusion. Cardiomediastinal silhouette and remainder of the exam is unremarkable. IMPRESSION: Hazy opacification over the right middle lobe which may represent early infection. Electronically Signed   By: Elberta Fortis M.D.   On: 05/24/2018 17:48   Ct Angio Chest Pe W And/or Wo Contrast  Result Date: 05/25/2018 CLINICAL DATA:  Shortness of breath, productive cough EXAM: CT ANGIOGRAPHY CHEST WITH CONTRAST TECHNIQUE: Multidetector CT imaging of the chest was performed using the standard protocol during bolus administration of intravenous contrast. Multiplanar CT image reconstructions and MIPs were obtained to evaluate the vascular anatomy. CONTRAST:  ISOVUE-370 IOPAMIDOL (ISOVUE-370) INJECTION 76% COMPARISON:  Chest x-ray earlier today FINDINGS: Cardiovascular: No filling defects in the pulmonary arteries to suggest pulmonary emboli. Heart is normal size. Aorta is normal caliber. Mediastinum/Nodes: Small and borderline sized mediastinal and bilateral hilar lymph nodes. Anterior mediastinal soft tissue felt represent residual thymus. Lungs/Pleura: Trace bilateral pleural effusions. Airspace disease in the peripheral lateral right lower lobe and in the lingula with extensive nodular airspace disease throughout the lungs bilaterally. Findings most compatible with multifocal pneumonia. Upper Abdomen: Imaging into the upper abdomen shows no acute findings. Musculoskeletal: Chest wall soft tissues are unremarkable. No acute bony abnormality. Review of the MIP images confirms the above findings. IMPRESSION: Extensive reticulonodular airspace disease throughout the lungs bilaterally with most confluent areas of consolidation in the right lower lobe and lingula. Findings most compatible with multifocal pneumonia. Reactive mediastinal and  bilateral hilar lymph nodes. Trace bilateral effusions. No evidence of pulmonary embolus. Electronically Signed   By: Charlett Nose M.D.   On: 05/25/2018 18:52    Procedures Procedures (including critical care time)  Medications Ordered in ED Medications  iopamidol (ISOVUE-370) 76 % injection (has no administration in time range)  ipratropium-albuterol (DUONEB) 0.5-2.5 (3) MG/3ML nebulizer solution 3 mL (3 mLs Nebulization Given 05/25/18 1433)  ipratropium-albuterol (DUONEB) 0.5-2.5 (3) MG/3ML nebulizer solution 3 mL (3 mLs Nebulization Given 05/25/18 1433)  albuterol (PROVENTIL HFA;VENTOLIN HFA) 108 (90 Base) MCG/ACT inhaler 2 puff (2 puffs Inhalation Given 05/25/18 1437)  sodium chloride 0.9 % bolus 500 mL (0 mLs Intravenous Stopped 05/25/18 1640)  cefTRIAXone (ROCEPHIN) 1 g in sodium chloride 0.9 % 100 mL IVPB (0 g Intravenous Stopped 05/25/18 1639)  azithromycin (ZITHROMAX) 500 mg in sodium chloride 0.9 % 250 mL IVPB (0 mg  Intravenous Stopped 05/25/18 1828)  ipratropium-albuterol (DUONEB) 0.5-2.5 (3) MG/3ML nebulizer solution 3 mL (3 mLs Nebulization Given 05/25/18 1609)  sodium chloride 0.9 % bolus 1,000 mL (0 mLs Intravenous Stopped 05/25/18 1759)  iopamidol (ISOVUE-370) 76 % injection 100 mL (100 mLs Intravenous Contrast Given 05/25/18 1826)  benzonatate (TESSALON) capsule 100 mg (100 mg Oral Given 05/25/18 1901)     Initial Impression / Assessment and Plan / ED Course  I have reviewed the triage vital signs and the nursing notes.  Pertinent labs & imaging results that were available during my care of the patient were reviewed by me and considered in my medical decision making (see chart for details).    Final Clinical Impressions(s) / ED Diagnoses   Final diagnoses:  Multifocal pneumonia   Patient presented the ED today complaining of a cough and shortness of breath for the last week.  Also associated with rhinorrhea, nasal congestion and sinus pressure.  No fevers or chills documented  at home.  Initially noted to have normal vital signs.  Monitoring of O2 sats throughout stay in the ED showed O2 sats below 95% throughout majority of ED stay.  Patient with intermittent tachycardia as well.  He appears well on exam and no acute distress.  Lungs with coarse breath sounds, more so on the right during initial pulmonary eval.  No tachypnea on initial exam.  Chest x-ray initially showed a right middle lobe pneumonia.  He was given multiple nebulizer treatments.  Following nebulizer treatments he was ambulated with pulse ox monitoring and O2 sats dropped to 93%, also had elevated heart rate to 120.  He did just receive 2 DuoNeb treatments and albuterol however given drop in O2 sats, will add labs, troponin and ddimer.  Labs show leukocytosis of 13.1.  BMP with normal electrolytes and kidney function.  BNP negative.  Troponin negative.  Initial lactic slightly elevated at 1.97, repeat lactic normal.  D-dimer slightly positive, CT PE ordered which showed multifocal pneumonia but no evidence of a PE.  Patient given fluids cough medicine and additional nebulizer treatment.  He was ambulated again with pulse ox monitoring and became tachycardic in the 120s.  His O2 sats dropped to 91% and he felt tachypneic.  RN noted tachypnea to the 30s during ambulation.  Given his multifocal pneumonia and low O2 sats with persistent tachycardia with ambulation, will plan for admit for further treatment of his pneumonia.  CONSULT with hospitalist service Dr. Toniann Fail who will admit the patient. He recommended ordering blood cultures and an influenza panel.   ED Discharge Orders    None       Rayne Du 05/25/18 2156    Melene Plan, DO 05/25/18 2235

## 2018-05-25 NOTE — ED Notes (Signed)
Pt LWBS 

## 2018-05-25 NOTE — H&P (Signed)
History and Physical    Brian Nolan UJW:[id number] DOB: 1981-05-12 DOA: 05/25/2018  PCP: Default, Provider, MD  Patient coming from: Home.  Chief Complaint: Shortness of breath.  HPI: Brian Nolan is a 37 y.o. male with previous history of alcohol and tobacco abuse quit for almost 3 months and has been in rehab now with previous history of schizophrenia presently not on any medications presents to the ER because of worsening shortness of breath with productive cough runny nose.  Patient's shortness of breath increased on exertion and feels fatigue.  Has been trying over-the-counter medication despite which patient symptoms does not improve.  ED Course: In the ER patient had a CT angiogram of the chest was negative for pulmonary embolism but showed extensive reticulonodular opacity bilaterally concerning for multifocal pneumonia.  There was bilateral pleural effusion and lymphadenopathy.  Review of Systems: As per HPI, rest all negative.   Past Medical History:  Diagnosis Date  . Depression   . Medical history non-contributory   . Schizophrenia (HCC)   . Substance abuse [facility](HCC)     Past Surgical History:  Procedure Laterality Date  . NO PAST SURGERIES       reports that he quit smoking about 3 months ago. His smoking use included cigarettes. He has a 10.00 pack-year smoking history. He has never used smokeless tobacco. He reports that he drinks about 6.0 - 24.0 standard drinks of alcohol per week. He reports that he has current or past drug history. Drugs: "Crack" cocaine and Marijuana.  No Known Allergies  Family History  Problem Relation Age of Onset  . Schizophrenia Maternal Uncle   . Diabetes Mellitus II Father     Prior to Admission medications   Medication Sig Start Date End Date Taking? Authorizing Provider  guaiFENesin (ROBITUSSIN) 100 MG/5ML liquid Take 200 mg by mouth 3 (three) times daily as needed for cough.   Yes [provider]   FLUoxetine (PROZAC) 20 MG capsule Take 1 capsule (20 mg total) by mouth daily. 02/01/17   Charm RingsLord, Jamison Y, NP  gemfibrozil (LOPID) 600 MG tablet Take 1 tablet (600 mg total) by mouth 2 (two) times daily before a meal. Patient not taking: Reported on 01/31/2017 04/27/16   Jimmy FootmanHernandez-Gonzalez, Andrea, MD  hydrOXYzine (ATARAX/VISTARIL) 25 MG tablet Take 3 tablets (75 mg total) by mouth at bedtime as needed (sleep). Patient not taking: Reported on 01/31/2017 06/17/16   Beau FannyWithrow, Elvyn C, FNP  hydrOXYzine (ATARAX/VISTARIL) 25 MG tablet Take 1 tablet (25 mg total) by mouth every 6 (six) hours as needed (anxiety/agitation or CIWA < or = 10). 02/01/17   Lord, Herminio HeadsJamison Y, NP  nicotine (NICODERM CQ - DOSED IN MG/24 HOURS) 21 mg/24hr patch Place 1 patch (21 mg total) onto the skin daily. Patient not taking: Reported on 01/31/2017 06/18/16   Beau FannyWithrow, Shykeem C, FNP  thiamine 100 MG tablet Take 1 tablet (100 mg total) by mouth daily. Patient not taking: Reported on 01/31/2017 06/18/16   Beau FannyWithrow, Malachai C, FNP  traZODone (DESYREL) 50 MG tablet Take 1 tablet (50 mg total) by mouth at bedtime as needed for sleep. 02/01/17   Charm RingsLord, Jamison Y, NP    Physical Exam: Vitals:   05/25/18 1622 05/25/18 1628 05/25/18 1815 05/25/18 2115  BP: (!) 109/59  114/67 122/72  Pulse: 95  (!) 104 96  Resp: 16  (!) 24 (!) 23  Temp:  98.6 F (37 C)    TempSrc:  Oral    SpO2:  98%  95% 95%      Constitutional: Moderately built and nourished. Vitals:   05/25/18 1622 05/25/18 1628 05/25/18 1815 05/25/18 2115  BP: (!) 109/59  114/67 122/72  Pulse: 95  (!) 104 96  Resp: 16  (!) 24 (!) 23  Temp:  98.6 F (37 C)    TempSrc:  Oral    SpO2: 98%  95% 95%   Eyes: Anicteric no pallor. ENMT: No discharge from the ears eyes nose or mouth. Neck: No JVD appreciated no mass felt. Respiratory: No rhonchi or crepitations. Cardiovascular: S1-S2 heard no murmurs appreciated. Abdomen: Soft nontender bowel sounds present. Musculoskeletal: No edema.  No  joint effusion. Skin: No rash.  Skin appears warm. Neurologic: Alert awake oriented to time place and person.  Moves all extremities.   Psychiatric: Appears normal.  Normal affect.   Labs on Admission: I have personally reviewed following labs and imaging studies  CBC: Recent Labs  Lab 05/25/18 1546  WBC 13.1*  NEUTROABS 8.8*  HGB 13.8  HCT 41.9  MCV 89.3  PLT 302   Basic Metabolic Panel: Recent Labs  Lab 05/25/18 1546  NA 138  K 3.8  CL 103  CO2 25  GLUCOSE 116*  BUN 7  CREATININE 0.92  CALCIUM 8.5*   GFR: Estimated Creatinine Clearance: 131 mL/min (by C-G formula based on SCr of 0.92 mg/dL). Liver Function Tests: No results for input(s): AST, ALT, ALKPHOS, BILITOT, PROT, ALBUMIN in the last 168 hours. No results for input(s): LIPASE, AMYLASE in the last 168 hours. No results for input(s): AMMONIA in the last 168 hours. Coagulation Profile: No results for input(s): INR, PROTIME in the last 168 hours. Cardiac Enzymes: No results for input(s): CKTOTAL, CKMB, CKMBINDEX, TROPONINI in the last 168 hours. BNP (last 3 results) No results for input(s): PROBNP in the last 8760 hours. HbA1C: No results for input(s): HGBA1C in the last 72 hours. CBG: No results for input(s): GLUCAP in the last 168 hours. Lipid Profile: No results for input(s): CHOL, HDL, LDLCALC, TRIG, CHOLHDL, LDLDIRECT in the last 72 hours. Thyroid Function Tests: No results for input(s): TSH, T4TOTAL, FREET4, T3FREE, THYROIDAB in the last 72 hours. Anemia Panel: No results for input(s): VITAMINB12, FOLATE, FERRITIN, TIBC, IRON, RETICCTPCT in the last 72 hours. Urine analysis: No results found for: COLORURINE, APPEARANCEUR, LABSPEC, PHURINE, GLUCOSEU, HGBUR, BILIRUBINUR, KETONESUR, PROTEINUR, UROBILINOGEN, NITRITE, LEUKOCYTESUR Sepsis Labs: @LABRCNTIP (procalcitonin:4,lacticidven:4) )No results found for this or any previous visit (from the past 240 hour(s)).   Radiological Exams on Admission: Dg  Chest 2 View  Result Date: 05/25/2018 CLINICAL DATA:  Shortness of breath and chest pain EXAM: CHEST - 2 VIEW COMPARISON:  May 24, 2018 FINDINGS: There is somewhat less opacity in the right middle lobe compared to 1 day prior. There does remain opacity along the more inferolateral aspect of the right middle lobe currently. There is also subtle increased opacity in the inferior lingula, stable from 1 day prior. Lungs elsewhere are clear. Heart size and pulmonary vascularity normal. No adenopathy. No bone lesions. IMPRESSION: Less airspace opacity in the right middle lobe, consistent with partial but incomplete clearing of pneumonia in this area. Stable apparently small area of infiltrate in the inferior lingula. The apparent right middle lobe and inferior lingular infiltrates appear superimposed on current lateral image. Lungs elsewhere clear. No adenopathy. Electronically Signed   By: Bretta Bang III M.D.   On: 05/25/2018 11:22   Dg Chest 2 View  Result Date: 05/24/2018 CLINICAL DATA:  Shortness of  breath with cough. EXAM: CHEST - 2 VIEW COMPARISON:  06/01/2016 FINDINGS: Lungs are adequately inflated with hazy opacification over the right middle lobe which may represent early infection. No effusion. Cardiomediastinal silhouette and remainder of the exam is unremarkable. IMPRESSION: Hazy opacification over the right middle lobe which may represent early infection. Electronically Signed   By: Elberta Fortis M.D.   On: 05/24/2018 17:48   Ct Angio Chest Pe W And/or Wo Contrast  Result Date: 05/25/2018 CLINICAL DATA:  Shortness of breath, productive cough EXAM: CT ANGIOGRAPHY CHEST WITH CONTRAST TECHNIQUE: Multidetector CT imaging of the chest was performed using the standard protocol during bolus administration of intravenous contrast. Multiplanar CT image reconstructions and MIPs were obtained to evaluate the vascular anatomy. CONTRAST:  ISOVUE-370 IOPAMIDOL (ISOVUE-370) INJECTION 76%  COMPARISON:  Chest x-ray earlier today FINDINGS: Cardiovascular: No filling defects in the pulmonary arteries to suggest pulmonary emboli. Heart is normal size. Aorta is normal caliber. Mediastinum/Nodes: Small and borderline sized mediastinal and bilateral hilar lymph nodes. Anterior mediastinal soft tissue felt represent residual thymus. Lungs/Pleura: Trace bilateral pleural effusions. Airspace disease in the peripheral lateral right lower lobe and in the lingula with extensive nodular airspace disease throughout the lungs bilaterally. Findings most compatible with multifocal pneumonia. Upper Abdomen: Imaging into the upper abdomen shows no acute findings. Musculoskeletal: Chest wall soft tissues are unremarkable. No acute bony abnormality. Review of the MIP images confirms the above findings. IMPRESSION: Extensive reticulonodular airspace disease throughout the lungs bilaterally with most confluent areas of consolidation in the right lower lobe and lingula. Findings most compatible with multifocal pneumonia. Reactive mediastinal and bilateral hilar lymph nodes. Trace bilateral effusions. No evidence of pulmonary embolus. Electronically Signed   By: Charlett Nose M.D.   On: 05/25/2018 18:52    EKG: Independently reviewed.  Normal sinus rhythm.  Assessment/Plan Principal Problem:   Multifocal pneumonia    1. Multifocal pneumonia -patient has been placed on ceftriaxone and Zithromax.  Follow blood cultures sputum cultures.  Check urine for Legionella strep antigen influenza PCR.  BNP was normal.  If symptoms does not improve patient may likely need bronchoscopy/pulmonary consult.  Denies any night sweats weight loss or any hemoptysis.  Has not had any contacts with TB. 2. History of schizophrenia presently not on medication. 3. History of tobacco abuse and alcohol abuse in remission.  He has been in rehab.  Has not had any of these for last 3 months as per the patient.   DVT prophylaxis: Lovenox. Code  Status: Full code. Family Communication: Discussed with patient. Disposition Plan: Home. Consults called: None. Admission status: Observation.   Eduard Clos MD [facility] Pager [phone].  If 7PM-7AM, please contact night-coverage [email or web address] Password [facility]  05/25/2018, 9:40 PM

## 2018-05-26 DIAGNOSIS — F209 Schizophrenia, unspecified: Secondary | ICD-10-CM

## 2018-05-26 DIAGNOSIS — G4700 Insomnia, unspecified: Secondary | ICD-10-CM

## 2018-05-26 DIAGNOSIS — J189 Pneumonia, unspecified organism: Secondary | ICD-10-CM

## 2018-05-26 DIAGNOSIS — F32A Depression, unspecified: Secondary | ICD-10-CM

## 2018-05-26 DIAGNOSIS — F1421 Cocaine dependence, in remission: Secondary | ICD-10-CM

## 2018-05-26 DIAGNOSIS — F329 Major depressive disorder, single episode, unspecified: Secondary | ICD-10-CM

## 2018-05-26 DIAGNOSIS — F1011 Alcohol abuse, in remission: Secondary | ICD-10-CM

## 2018-05-26 LAB — BASIC METABOLIC PANEL
ANION GAP: 10 (ref 5–15)
BUN: 7 mg/dL (ref 6–20)
CHLORIDE: 106 mmol/L (ref 98–111)
CO2: 24 mmol/L (ref 22–32)
CREATININE: 0.89 mg/dL (ref 0.61–1.24)
Calcium: 8.3 mg/dL — ABNORMAL LOW (ref 8.9–10.3)
GFR calc non Af Amer: >60 mL/min (ref >60)
Glucose, Bld: 116 mg/dL — ABNORMAL HIGH (ref 70–99)
Potassium: 4.1 mmol/L (ref 3.5–5.1)
SODIUM: 140 mmol/L (ref 135–145)

## 2018-05-26 LAB — CBC
HCT: 37.9 % — ABNORMAL LOW (ref 39.0–52.0)
HEMOGLOBIN: 12.6 g/dL — AB (ref 13.0–17.0)
MCH: 29.6 pg (ref 26.0–34.0)
MCHC: 33.2 g/dL (ref 30.0–36.0)
MCV: 89.2 fL (ref 78.0–100.0)
PLATELETS: 297 10*3/uL (ref 150–400)
RBC: 4.25 MIL/uL (ref 4.22–5.81)
RDW: 12.8 % (ref 11.5–15.5)
WBC: 11.2 10*3/uL — AB (ref 4.0–10.5)

## 2018-05-26 LAB — STREP PNEUMONIAE URINARY ANTIGEN: Strep Pneumo Urinary Antigen: NEGATIVE

## 2018-05-26 LAB — HIV ANTIBODY (ROUTINE TESTING W REFLEX): HIV Screen 4th Generation wRfx: NONREACTIVE

## 2018-05-26 MED ORDER — ALBUTEROL SULFATE (2.5 MG/3ML) 0.083% IN NEBU
2.5000 mg | INHALATION_SOLUTION | Freq: Three times a day (TID) | RESPIRATORY_TRACT | Status: DC
  Administered 2018-05-26: 2.5 mg via RESPIRATORY_TRACT
  Filled 2018-05-26: qty 3

## 2018-05-26 MED ORDER — DOXYCYCLINE HYCLATE 100 MG PO CAPS: 100.0000 mg | ORAL_CAPSULE | Freq: Two times a day (BID) | ORAL | 0 refills | Status: DC

## 2018-05-26 MED ORDER — ACETAMINOPHEN 325 MG PO TABS: 650.0000 mg | ORAL_TABLET | Freq: Four times a day (QID) | ORAL | Status: DC | PRN

## 2018-05-26 NOTE — Discharge Summary (Signed)
Physician Discharge Summary  Brian Nolan ZOX:[id number] DOB: 05/19/1981 DOA: 05/25/2018  PCP: Default, Provider, MD  Admit date: 05/25/2018 Discharge date: 05/26/2018  Admitted From: Home   Disposition: Home  Recommendations for Outpatient Follow-up:  1. Follow up with PCP within one week with labs  2. Cultures, including Legionella, strep anginal, are pending, will need to follow with PCP  Home Health:  No Equipment/Devices: None  Discharge Condition: Stable   CODE STATUS: FULL    Diet recommendation:  Regular  Brief/Interim Summary:  37 year old male with a prior history of alcohol and tobacco abuse, having quit for almost 3 months, has been in rehab, as well as a history of schizophrenia, not on any medications, presenting to the emergency department with worsening shortness of breath, productive cough and rhinorrhea.  He also was complaining of fatigue, but denies any weight loss, or night sweats or hemoptysis.  He denies any TB contact.  He tried over-the-counter medications, without improvement.  At the ER, the patient underwent a CT Angio of the chest, negative for PE, but did show extensive reticulonodular opacity bilaterally, concerning with multifocal pneumonia.  There was also bilateral pleural effusion and lymphadenopathy seen.  The patient was placed on ceftriaxone and Zithromax, after cultures were drawn with results pending BNP was normal.  He also received a nebulizer treatment today, with some improvement of his symptoms.  Overall, he is feeling better, and has been instructed upon discharge to follow-up with health and wellness in a week.  Discharge Diagnoses:  Principal Problem:   Multifocal pneumonia Active Problems:   Hyperlipidemia   History of cocaine dependence   History of alcohol abuse   Depression   Insomnia   Schizophrenia (HCC)  Multifocal pneumonia CT Angio of the chest taken due to d-dimer of 1.74, negative for PE, but positive for bilateral,  multifocal pneumonia.   White count was 13, with repeat at 11 Patient was placed on IV antibiotics with ceftriaxone and Zithromax along with nebulizers with improvement of his symptoms. Cultures, including Legionella, strep anginal, are pending, will need to follow with PCP Influenza by PCR is negative. Discharge to home with doxycycline 100 mg twice daily Guaifenesin 200 mg twice daily as needed for cough  History of schizophrenia, was not on medication on presentation.  Depression Continue fluoxetine 20 mg daily  Insomnia Continue trazodone nightly.  History of tobacco abuse, on remission, none over the last 3 months He will be discharged on nicotine patch every 24 hours  History of alcohol abuse, on remission, none over the last 3 months Continue thiamine, hydroxyzine  Hyperlipidemia Continue Lopid  Discharge Instructions  Discharge Instructions    Diet - low sodium heart healthy   Complete by:  As directed    Discharge instructions   Complete by:  As directed    No smoking Rest and drink plenty of fluid(water) Use the website goodrx to find the best pharmacy and coupon for the antibiotic (should be about $12 or less at [facility])   Increase activity slowly   Complete by:  As directed      Allergies as of 05/26/2018   No Known Allergies     Medication List    STOP taking these medications   gemfibrozil 600 MG tablet Commonly known as:  LOPID   nicotine 21 mg/24hr patch Commonly known as:  NICODERM CQ - dosed in mg/24 hours     TAKE these medications   acetaminophen 325 MG tablet Commonly known as:  TYLENOL Take 2 tablets (650 mg total) by mouth every 6 (six) hours as needed for mild pain (or Fever >/= 101).   doxycycline 100 MG capsule Commonly known as:  VIBRAMYCIN Take 1 capsule (100 mg total) by mouth 2 (two) times daily.   FLUoxetine 20 MG capsule Commonly known as:  PROZAC Take 1 capsule (20 mg total) by mouth daily.   guaiFENesin 100  MG/5ML liquid Commonly known as:  ROBITUSSIN Take 200 mg by mouth 3 (three) times daily as needed for cough.   hydrOXYzine 25 MG tablet Commonly known as:  ATARAX/VISTARIL Take 1 tablet (25 mg total) by mouth every 6 (six) hours as needed (anxiety/agitation or CIWA < or = 10). What changed:  Another medication with the same name was removed. Continue taking this medication, and follow the directions you see here.   thiamine 100 MG tablet Take 1 tablet (100 mg total) by mouth daily.   traZODone 50 MG tablet Commonly known as:  DESYREL Take 1 tablet (50 mg total) by mouth at bedtime as needed for sleep.      Follow-up Information    [facility].   Why:  Please follow up with primary care. [facility] will see patients without insurance. Contact information: [address] [place] [phone]       [facility] EMERGENCY DEPARTMENT.   Specialty:  Emergency Medicine Why:  Return to the ER with any new or worsening symptoms Contact information: [address] [id number] mc [place] [address] [phone]         No Known Allergies  Consultations: None  Procedures/Studies: Dg Chest 2 View  Result Date: 05/25/2018 CLINICAL DATA:  Shortness of breath and chest pain EXAM: CHEST - 2 VIEW COMPARISON:  May 24, 2018 FINDINGS: There is somewhat less opacity in the right middle lobe compared to 1 day prior. There does remain opacity along the more inferolateral aspect of the right middle lobe currently. There is also subtle increased opacity in the inferior lingula, stable from 1 day prior. Lungs elsewhere are clear. Heart size and pulmonary vascularity normal. No adenopathy. No bone lesions. IMPRESSION: Less airspace opacity in the right middle lobe, consistent with partial but incomplete clearing of pneumonia in this area. Stable apparently small area of infiltrate in  the inferior lingula. The apparent right middle lobe and inferior lingular infiltrates appear superimposed on current lateral image. Lungs elsewhere clear. No adenopathy. Electronically Signed   By: Bretta Bang III M.D.   On: 05/25/2018 11:22   Dg Chest 2 View  Result Date: 05/24/2018 CLINICAL DATA:  Shortness of breath with cough. EXAM: CHEST - 2 VIEW COMPARISON:  06/01/2016 FINDINGS: Lungs are adequately inflated with hazy opacification over the right middle lobe which may represent early infection. No effusion. Cardiomediastinal silhouette and remainder of the exam is unremarkable. IMPRESSION: Hazy opacification over the right middle lobe which may represent early infection. Electronically Signed   By: Elberta Fortis M.D.   On: 05/24/2018 17:48   Ct Angio Chest Pe W And/or Wo Contrast  Result Date: 05/25/2018 CLINICAL DATA:  Shortness of breath, productive cough EXAM: CT ANGIOGRAPHY CHEST WITH CONTRAST TECHNIQUE: Multidetector CT imaging of the chest was performed using the standard protocol during bolus administration of intravenous contrast. Multiplanar CT image reconstructions and MIPs were obtained to evaluate the vascular anatomy. CONTRAST:  ISOVUE-370 IOPAMIDOL (ISOVUE-370) INJECTION 76% COMPARISON:  Chest x-ray earlier today FINDINGS: Cardiovascular: No  filling defects in the pulmonary arteries to suggest pulmonary emboli. Heart is normal size. Aorta is normal caliber. Mediastinum/Nodes: Small and borderline sized mediastinal and bilateral hilar lymph nodes. Anterior mediastinal soft tissue felt represent residual thymus. Lungs/Pleura: Trace bilateral pleural effusions. Airspace disease in the peripheral lateral right lower lobe and in the lingula with extensive nodular airspace disease throughout the lungs bilaterally. Findings most compatible with multifocal pneumonia. Upper Abdomen: Imaging into the upper abdomen shows no acute findings. Musculoskeletal: Chest wall soft tissues are  unremarkable. No acute bony abnormality. Review of the MIP images confirms the above findings. IMPRESSION: Extensive reticulonodular airspace disease throughout the lungs bilaterally with most confluent areas of consolidation in the right lower lobe and lingula. Findings most compatible with multifocal pneumonia. Reactive mediastinal and bilateral hilar lymph nodes. Trace bilateral effusions. No evidence of pulmonary embolus. Electronically Signed   By: Charlett Nose M.D.   On: 05/25/2018 18:52      Subjective: Cough and shortness of breath have improved.  He denies any hemoptysis.  He denies any chest pain or palpitations.  He denies any nausea or vomiting.  He denies any abdominal pain.  He denies any lower extremity swelling or calf pain.  No oxygen was needed during the hospitalization.   Discharge Exam: Vitals:   05/26/18 0743 05/26/18 0819  BP: 121/77   Pulse: 89 89  Resp: 18 18  Temp: 98.7 F (37.1 C)   SpO2: 97% 94%   Vitals:   05/26/18 0304 05/26/18 0522 05/26/18 0743 05/26/18 0819  BP:  106/61 121/77   Pulse:  88 89 89  Resp:  18 18 18   Temp:  98.6 F (37 C) 98.7 F (37.1 C)   TempSrc:  Oral Oral   SpO2: 97% 92% 97% 94%  Weight:      Height:        General: Pt is alert, awake, not in acute distress, respiratory status improving Cardiovascular: RRR, S1/S2 +, no rubs, no gallops Respiratory: Bilateral expiratory rhonchi, no wheezing.  No rails.  No accessory muscle use. Abdominal: Soft, NT, ND, bowel sounds + Extremities: no edema, no cyanosis    The results of significant diagnostics from this hospitalization (including imaging, microbiology, ancillary and laboratory) are listed below for reference.     Microbiology: Recent Results (from the past 240 hour(s))  Blood culture (routine x 2)     Status: None (Preliminary result)   Collection Time: 05/25/18 10:00 PM  Result Value Ref Range Status   Specimen Description BLOOD LEFT HAND  Final   Special Requests    Final    BOTTLES DRAWN AEROBIC AND ANAEROBIC Blood Culture adequate volume   Culture   Final    NO GROWTH < 24 HOURS Performed at [facility], [address], [place], [place] [address]    Report Status PENDING  Incomplete  Blood culture (routine x 2)     Status: None (Preliminary result)   Collection Time: 05/25/18 10:04 PM  Result Value Ref Range Status   Specimen Description BLOOD RIGHT HAND  Final   Special Requests   Final    BOTTLES DRAWN AEROBIC AND ANAEROBIC Blood Culture results may not be optimal due to an inadequate volume of blood received in culture bottles   Culture   Final    NO GROWTH < 24 HOURS Performed at [facility], [address] [place], [place] [address]    Report Status PENDING  Incomplete     Labs:  BNP (last 3 results) Recent Labs    05/25/18 1546  BNP 36.8   Basic Metabolic Panel: Recent Labs  Lab 05/25/18 1546 05/26/18 0519  NA 138 140  K 3.8 4.1  CL 103 106  CO2 25 24  GLUCOSE 116* 116*  BUN 7 7  CREATININE 0.92 0.89  CALCIUM 8.5* 8.3*   Liver Function Tests: No results for input(s): AST, ALT, ALKPHOS, BILITOT, PROT, ALBUMIN in the last 168 hours. No results for input(s): LIPASE, AMYLASE in the last 168 hours. No results for input(s): AMMONIA in the last 168 hours. CBC: Recent Labs  Lab 05/25/18 1546 05/26/18 0519  WBC 13.1* 11.2*  NEUTROABS 8.8*  --   HGB 13.8 12.6*  HCT 41.9 37.9*  MCV 89.3 89.2  PLT 302 297   Cardiac Enzymes: No results for input(s): CKTOTAL, CKMB, CKMBINDEX, TROPONINI in the last 168 hours. BNP: Invalid input(s): POCBNP CBG: No results for input(s): GLUCAP in the last 168 hours. D-Dimer Recent Labs    05/25/18 1546  DDIMER 1.74*   Hgb A1c No results for input(s): HGBA1C in the last 72 hours. Lipid Profile No results for input(s): CHOL, HDL, LDLCALC, TRIG, CHOLHDL, LDLDIRECT in the last 72 hours. Thyroid function studies No results for input(s): TSH, T4TOTAL, T3FREE, THYROIDAB in  the last 72 hours.  Invalid input(s): FREET3 Anemia work up No results for input(s): VITAMINB12, FOLATE, FERRITIN, TIBC, IRON, RETICCTPCT in the last 72 hours. Urinalysis No results found for: COLORURINE, APPEARANCEUR, LABSPEC, PHURINE, GLUCOSEU, HGBUR, BILIRUBINUR, KETONESUR, PROTEINUR, UROBILINOGEN, NITRITE, LEUKOCYTESUR Sepsis Labs Invalid input(s): PROCALCITONIN,  WBC,  LACTICIDVEN Microbiology Recent Results (from the past 240 hour(s))  Blood culture (routine x 2)     Status: None (Preliminary result)   Collection Time: 05/25/18 10:00 PM  Result Value Ref Range Status   Specimen Description BLOOD LEFT HAND  Final   Special Requests   Final    BOTTLES DRAWN AEROBIC AND ANAEROBIC Blood Culture adequate volume   Culture   Final    NO GROWTH < 24 HOURS Performed at [facility], [address], [place], [place] [address]    Report Status PENDING  Incomplete  Blood culture (routine x 2)     Status: None (Preliminary result)   Collection Time: 05/25/18 10:04 PM  Result Value Ref Range Status   Specimen Description BLOOD RIGHT HAND  Final   Special Requests   Final    BOTTLES DRAWN AEROBIC AND ANAEROBIC Blood Culture results may not be optimal due to an inadequate volume of blood received in culture bottles   Culture   Final    NO GROWTH < 24 HOURS Performed at [facility], [address], [place], [place] [address]    Report Status PENDING  Incomplete     Time coordinating discharge: Over 30 minutes  SIGNED:   Marlowe Kays, MD  [facility] 05/26/2018, 3:44 PM   If 7PM-7AM, please contact night-coverage [email or web address] Password [email or web address]

## 2018-05-26 NOTE — Progress Notes (Signed)
Patient discharged to home. Patient AVS reviewed and signed. Patient capable re-verbalizing medications and follow-up appointments. IV removed. Patient belongings sent with patient. Patient educated to return to the ED in the event of SOB, chest pain or dizziness.   Shanyia Stines B. RN 

## 2018-05-26 NOTE — Care Management Note (Signed)
Case Management Note  Patient Details  Name: Brian CosierJohn Anthony Gail Nolan MRN: [id number] Date of Birth: [birth year]  Subjective/Objective:     Pt in with pneumonia. He is from home with family. Pt does not have a PCP and states he doesn't have insurance currently but insurance through his work will start next week.                Action/Plan: CM was able to [facility] [address]. Pt provided information. CM also provided him coupon for assistance with his medications.  Pt states he has transportation home.  Expected Discharge Date:  05/26/18               Expected Discharge Plan:  Home/Self Care  In-House Referral:     Discharge planning Services     Post Acute Care Choice:    Choice offered to:     DME Arranged:    DME Agency:     HH Arranged:    HH Agency:     Status of Service:  Completed, signed off  If discussed at [employer] Length of Stay Meetings, dates discussed:    Additional Comments:  Kermit BaloKelli F Luna Audia, RN 05/26/2018, 2:36 PM

## 2018-05-26 NOTE — Progress Notes (Signed)
New Admission Note:   Arrival Method: Arrived from ED via wheelchair Mental Orientation: Alert and oriented x4 Telemetry: N/A Assessment: Completed Skin: Intact IV: NSL- RUA,Lt AC Pain: Denies Tubes: N/A Safety Measures: Safety Fall Prevention Plan has been discussed. Admission: Completed Orientation: Patient has been orientated to the room, unit and staff.  Family: None at bedside  Orders have been reviewed and implemented. Will continue to monitor the patient. Call light has been placed within reach.   Angeliyah Kirkey [employer], RN-BC Phone number: [phone]

## 2018-05-27 LAB — LEGIONELLA PNEUMOPHILA SEROGP 1 UR AG: L. PNEUMOPHILA SEROGP 1 UR AG: NEGATIVE

## 2018-05-30 LAB — CULTURE, BLOOD (ROUTINE X 2)
Culture: NO GROWTH
Culture: NO GROWTH
Special Requests: ADEQUATE

## 2018-05-31 ENCOUNTER — Emergency Department (HOSPITAL_COMMUNITY)
Admission: EM | Admit: 2018-05-31 | Discharge: 2018-05-31 | Disposition: A | Discharge status: Home / Self Care | Payer: Self-pay | Attending: Emergency Medicine | Admitting: Emergency Medicine

## 2018-05-31 ENCOUNTER — Other Ambulatory Visit: Payer: Self-pay

## 2018-05-31 ENCOUNTER — Emergency Department (HOSPITAL_COMMUNITY): Payer: Self-pay

## 2018-05-31 ENCOUNTER — Encounter (HOSPITAL_COMMUNITY): Payer: Self-pay

## 2018-05-31 DIAGNOSIS — R0602 Shortness of breath: Secondary | ICD-10-CM | POA: Insufficient documentation

## 2018-05-31 DIAGNOSIS — R079 Chest pain, unspecified: Secondary | ICD-10-CM | POA: Insufficient documentation

## 2018-05-31 DIAGNOSIS — Z87891 Personal history of nicotine dependence: Secondary | ICD-10-CM | POA: Insufficient documentation

## 2018-05-31 DIAGNOSIS — E785 Hyperlipidemia, unspecified: Secondary | ICD-10-CM | POA: Insufficient documentation

## 2018-05-31 LAB — CBC WITH DIFFERENTIAL/PLATELET
Abs Immature Granulocytes: 0.3 10*3/uL — ABNORMAL HIGH (ref 0.0–0.1)
BASOS ABS: 0.1 10*3/uL (ref 0.0–0.1)
BASOS PCT: 1 %
EOS ABS: 0.3 10*3/uL (ref 0.0–0.7)
EOS PCT: 3 %
HCT: 44.6 % (ref 39.0–52.0)
Hemoglobin: 14.8 g/dL (ref 13.0–17.0)
Immature Granulocytes: 3 %
Lymphocytes Relative: 23 %
Lymphs Abs: 2.5 10*3/uL (ref 0.7–4.0)
MCH: 29.2 pg (ref 26.0–34.0)
MCHC: 33.2 g/dL (ref 30.0–36.0)
MCV: 88 fL (ref 78.0–100.0)
MONO ABS: 0.8 10*3/uL (ref 0.1–1.0)
MONOS PCT: 7 %
NEUTROS ABS: 6.8 10*3/uL (ref 1.7–7.7)
Neutrophils Relative %: 63 %
PLATELETS: 403 10*3/uL — AB (ref 150–400)
RBC: 5.07 MIL/uL (ref 4.22–5.81)
RDW: 12.7 % (ref 11.5–15.5)
WBC: 10.8 10*3/uL — ABNORMAL HIGH (ref 4.0–10.5)

## 2018-05-31 LAB — BASIC METABOLIC PANEL
Anion gap: 10 (ref 5–15)
BUN: 9 mg/dL (ref 6–20)
CO2: 24 mmol/L (ref 22–32)
CREATININE: 0.9 mg/dL (ref 0.61–1.24)
Calcium: 9.5 mg/dL (ref 8.9–10.3)
Chloride: 103 mmol/L (ref 98–111)
GFR calc Af Amer: >60 mL/min (ref >60)
GFR calc non Af Amer: >60 mL/min (ref >60)
GLUCOSE: 115 mg/dL — AB (ref 70–99)
POTASSIUM: 4.1 mmol/L (ref 3.5–5.1)
Sodium: 137 mmol/L (ref 135–145)

## 2018-05-31 LAB — I-STAT TROPONIN, ED: TROPONIN I, POC: 0 ng/mL (ref 0.00–0.08)

## 2018-05-31 MED ORDER — ACETAMINOPHEN 500 MG PO TABS
1000.0000 mg | ORAL_TABLET | Freq: Once | ORAL | Status: AC
  Administered 2018-05-31: 1000 mg via ORAL
  Filled 2018-05-31: qty 2

## 2018-05-31 MED ORDER — ALBUTEROL SULFATE (2.5 MG/3ML) 0.083% IN NEBU
2.5000 mg | INHALATION_SOLUTION | Freq: Once | RESPIRATORY_TRACT | Status: AC
  Administered 2018-05-31: 2.5 mg via RESPIRATORY_TRACT
  Filled 2018-05-31: qty 3

## 2018-05-31 MED ORDER — KETOROLAC TROMETHAMINE 15 MG/ML IJ SOLN
15.0000 mg | Freq: Once | INTRAMUSCULAR | Status: AC
  Administered 2018-05-31: 15 mg via INTRAVENOUS
  Filled 2018-05-31: qty 1

## 2018-05-31 MED ORDER — SODIUM CHLORIDE 0.9 % IV BOLUS
500.0000 mL | Freq: Once | INTRAVENOUS | Status: AC
  Administered 2018-05-31: 500 mL via INTRAVENOUS

## 2018-05-31 MED ORDER — IPRATROPIUM-ALBUTEROL 0.5-2.5 (3) MG/3ML IN SOLN
3.0000 mL | Freq: Once | RESPIRATORY_TRACT | Status: AC
  Administered 2018-05-31: 3 mL via RESPIRATORY_TRACT
  Filled 2018-05-31: qty 3

## 2018-05-31 NOTE — Discharge Instructions (Signed)
Take 600 mg of ibuprofen every 6 hours for your pain. Continue taking your antibiotics. Take your inhaler every 6 hours.  Please follow up with your primary care provider within 5-7 days for re-evaluation of your symptoms. If you do not have a primary care provider, information for a healthcare clinic has been provided for you to make arrangements for follow up care. Please return to the emergency department for any new or worsening symptoms.

## 2018-05-31 NOTE — ED Provider Notes (Signed)
[facility] EMERGENCY DEPARTMENT Provider Note   CSN: [id number] Arrival date & time: 05/31/18  4098     History   Chief Complaint Chief Complaint  Patient presents with  . Chest Pain    HPI Brian Nolan is a 37 y.o. male.  HPI  Patient is 37 year old male with a history of depression, schizophrenia, substance abuse who presents the emergency department today for evaluation of left-sided chest pain that began gradually several days ago but seemed to worsen last night and keep him up from sleep.  Rates pain as 10/10 and constant.  It does not radiate.  It is worse with movement and palpation.  He reports associated shortness of breath that seem to worsen a few days ago.  He was recently admitted for multifocal pneumonia.  Was admitted 05/25/2018 and discharged 05/26/18.  He was discharged on antibiotics and with an albuterol inhaler which he states he has been using every few hours without significant relief. Has had continued productive cough. Denies fevers, chills, ble edema, hemoptysis.  Past Medical History:  Diagnosis Date  . Depression   . Medical history non-contributory   . Schizophrenia (HCC)   . Substance abuse [facility])     Patient Active Problem List   Diagnosis Date Noted  . History of cocaine dependence 05/26/2018  . History of alcohol abuse 05/26/2018  . Depression 05/26/2018  . Insomnia 05/26/2018  . Schizophrenia (HCC) 05/26/2018  . Multifocal pneumonia 05/25/2018  . 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 Tobacco use disorder 04/25/2016  . Hyperlipidemia 10/12/2015  . Cannabis use disorder, mild, abuse 10/11/2015    Past Surgical History:  Procedure Laterality Date  . NO PAST SURGERIES          Home Medications    Prior to Admission medications   Medication Sig Start Date End Date Taking? Authorizing Provider  acetaminophen (TYLENOL) 325 MG tablet Take 2 tablets (650 mg total) by mouth  every 6 (six) hours as needed for mild pain (or Fever >/= 101). 05/26/18  Yes Joseph Art, DO  doxycycline (VIBRAMYCIN) 100 MG capsule Take 1 capsule (100 mg total) by mouth 2 (two) times daily. 05/26/18  Yes Marlin Canary U, DO  thiamine 100 MG tablet Take 1 tablet (100 mg total) by mouth daily. 06/18/16  Yes Withrow, Everardo All, FNP  FLUoxetine (PROZAC) 20 MG capsule Take 1 capsule (20 mg total) by mouth daily. Patient not taking: Reported on 05/31/2018 02/01/17   Charm Rings, NP  hydrOXYzine (ATARAX/VISTARIL) 25 MG tablet Take 1 tablet (25 mg total) by mouth every 6 (six) hours as needed (anxiety/agitation or CIWA < or = 10). Patient not taking: Reported on 05/31/2018 02/01/17   Charm Rings, NP  traZODone (DESYREL) 50 MG tablet Take 1 tablet (50 mg total) by mouth at bedtime as needed for sleep. Patient not taking: Reported on 05/31/2018 02/01/17   Charm Rings, NP    Family History Family History  Problem Relation Age of Onset  . Schizophrenia Maternal Uncle   . Diabetes Mellitus II Father     Social History Social History   Tobacco Use  . Smoking status: Former Smoker    Packs/day: 1.00    Years: 10.00    Pack years: 10.00    Types: Cigarettes    Last attempt to quit: 01/30/2018    Years since quitting: 0.3  . Smokeless tobacco: Never Used  Substance Use Topics  . Alcohol use:  Yes    Alcohol/week: 6.0 - 24.0 standard drinks    Types: 6 - 24 Cans of beer per week  . Drug use: Yes    Types: "Crack" cocaine, Marijuana     Allergies   Patient has no known allergies.   Review of Systems Review of Systems  Constitutional: Negative for chills and fever.  HENT: Positive for congestion.   Eyes: Negative for visual disturbance.  Respiratory: Positive for cough and shortness of breath.   Cardiovascular: Positive for chest pain. Negative for leg swelling.  Gastrointestinal: Negative for abdominal pain, constipation, diarrhea, nausea and vomiting.  Genitourinary: Negative for  dysuria.  Musculoskeletal: Negative for back pain.  Skin: Negative for wound.  Neurological: Negative for headaches.    Physical Exam Updated Vital Signs BP 100/64   Pulse (!) 102   Temp 98.2 F (36.8 C) (Oral)   Resp 18   Ht 5\' 9"  (1.753 m)   Wt 102.7 kg   SpO2 97%   BMI 33.44 kg/m   Physical Exam  Constitutional: He appears well-developed and well-nourished.  HENT:  Head: Normocephalic and atraumatic.  Eyes: Conjunctivae are normal.  Neck: Neck supple.  Cardiovascular: Normal rate and regular rhythm.  No murmur heard. Pulmonary/Chest: Effort normal and breath sounds normal. No respiratory distress. He has no decreased breath sounds. He has no wheezes. He has no rales.  Left sided chest wall TTP that reproduces pain  Abdominal: Soft. Bowel sounds are normal. He exhibits no distension. There is no tenderness.  Musculoskeletal: He exhibits no edema.       Right lower leg: Normal. He exhibits no tenderness and no edema.       Left lower leg: Normal. He exhibits no tenderness and no edema.  Neurological: He is alert.  Skin: Skin is warm and dry. Capillary refill takes less than 2 seconds.  Psychiatric: He has a normal mood and affect.  Nursing note and vitals reviewed.   ED Treatments / Results  Labs (all labs ordered are listed, but only abnormal results are displayed) Labs Reviewed  CBC WITH DIFFERENTIAL/PLATELET - Abnormal; Notable for the following components:      Result Value   WBC 10.8 (*)    Platelets 403 (*)    Abs Immature Granulocytes 0.3 (*)    All other components within normal limits  BASIC METABOLIC PANEL - Abnormal; Notable for the following components:   Glucose, Bld 115 (*)    All other components within normal limits  I-STAT TROPONIN, ED    EKG EKG Interpretation  Date/Time:  Monday May 31 2018 06:37:16 EDT Ventricular Rate:  95 PR Interval:    QRS Duration: 78 QT Interval:  323 QTC Calculation: 406 R Axis:   62 Text  Interpretation:  Sinus rhythm Atrial premature complex No significant change since last tracing 25 May 3028 Confirmed by Devoria Albe ([id number]) on 05/31/2018 6:43:25 AM Also confirmed by Devoria Albe ([id number]), editor Elita Quick ([id number])  on 05/31/2018 8:13:23 AM   Radiology Dg Chest 2 View  Result Date: 05/31/2018 CLINICAL DATA:  Chest pain EXAM: CHEST - 2 VIEW COMPARISON:  Chest radiograph and chest CT May 25, 2018 FINDINGS: There is airspace consolidation in the right base as well as in the inferior lingula. There is atelectatic change slightly more superiorly in the lingula. Lungs elsewhere are clear. Heart is borderline enlarged with pulmonary vascularity within normal limits. No adenopathy. No bone lesions. IMPRESSION: There remains airspace consolidation in the right base region. There is  also infiltrate in the inferior lingula which appear similar to prior studies. There is atelectatic change more superiorly in the lingula. Lungs elsewhere clear. Stable cardiac prominence. No adenopathy evident. Electronically Signed   By: Bretta Bang III M.D.   On: 05/31/2018 07:39    Procedures Procedures (including critical care time)  Medications Ordered in ED Medications  ipratropium-albuterol (DUONEB) 0.5-2.5 (3) MG/3ML nebulizer solution 3 mL (3 mLs Nebulization Given 05/31/18 0658)  ketorolac (TORADOL) 15 MG/ML injection 15 mg (15 mg Intravenous Given 05/31/18 0711)  ipratropium-albuterol (DUONEB) 0.5-2.5 (3) MG/3ML nebulizer solution 3 mL (3 mLs Nebulization Given 05/31/18 0804)  sodium chloride 0.9 % bolus 500 mL (0 mLs Intravenous Stopped 05/31/18 0904)  acetaminophen (TYLENOL) tablet 1,000 mg (1,000 mg Oral Given 05/31/18 0803)  albuterol (PROVENTIL) (2.5 MG/3ML) 0.083% nebulizer solution 2.5 mg (2.5 mg Nebulization Given 05/31/18 1029)  ketorolac (TORADOL) 15 MG/ML injection 15 mg (15 mg Intravenous Given 05/31/18 1029)     Initial Impression / Assessment and Plan / ED Course  I have  reviewed the triage vital signs and the nursing notes.  Pertinent labs & imaging results that were available during my care of the patient were reviewed by me and considered in my medical decision making (see chart for details).     Final Clinical Impressions(s) / ED Diagnoses   Final diagnoses:  Nonspecific chest pain   Pt is a 37 y/o male who presents to the ED today c/o left sided chest pain that has been present for several days but seemed to worsen last night. Pain associated with SOB and cough. Pain is able to be reproduced on exam. His lungs are clear and cardiac exam is benign. Records reviewed. He was recently dx with mulitofocal pneumonia and was admitted about 1 week ago. He was discharged with doxycycline and an albuterol inhaler which he states he has been taking.  His sxs seem atypical for ACS, his heart score is 1 based on smoking hx. His sxs also seem less likely PE and more likely related to his recent dx of pneumonia. He had negative CTA on 05/25/18. His admission for PNA was only for 1 day. Doubt AAA or esophageal rupture.  Labs today are reassuring. His leukocytosis is improving. His electrolytes are normal. Trop is normal. ECG with NSR and PACs, unchanged from prior. CXR with remaining airspace consolidation in the right base. Also with infiltrate in the lingular similar to prior study. This is consistent with pts previously dx pneumonia  Re-evaluated pt after meds and duo nebs and pts breathing and pain feels improved. Increased air flow throughout.  Suspect that his sxs are secondary to pleurisy versus chest wall pain. Doubt other acute etiology of sxs at this time. Advised him to continue abx and to take antiinflammatories for pain. Discussed plan with pt and advised that he will need to return to the ED if any new or worsening sxs develop. He voices an understanding of plan and reasons to return. He is eager for d/c/ all questions answered.  ED Discharge Orders    None         Rayne Du 05/31/18 1151    Devoria Albe, MD 06/06/18 2259

## 2018-05-31 NOTE — ED Triage Notes (Signed)
Pt POV d/t to 10/10 L.CP that started last PM w/ SOB, and bilat flank pain.  Prev seen for Pneumonia.

## 2018-06-24 ENCOUNTER — Telehealth: Payer: Self-pay | Admitting: Pharmacy Technician

## 2018-06-24 NOTE — Telephone Encounter (Signed)
Patient failed to provide 2019 poi.  No additional medication assistance will be provided by [facility] without the required proof of income documentation.  Patient notified by letter.  Betty J. Kluttz Care Manager [facility] 

## 2021-11-14 ENCOUNTER — Emergency Department (HOSPITAL_COMMUNITY)
Admission: EM | Admit: 2021-11-14 | Discharge: 2021-11-15 | Disposition: A | Discharge status: Other Acute Inpatient Hospital | Payer: Self-pay | Attending: Emergency Medicine | Admitting: Emergency Medicine

## 2021-11-14 ENCOUNTER — Encounter (HOSPITAL_COMMUNITY): Payer: Self-pay

## 2021-11-14 ENCOUNTER — Other Ambulatory Visit: Payer: Self-pay

## 2021-11-14 DIAGNOSIS — F1524 Other stimulant dependence with stimulant-induced mood disorder: Secondary | ICD-10-CM | POA: Insufficient documentation

## 2021-11-14 DIAGNOSIS — R45851 Suicidal ideations: Secondary | ICD-10-CM

## 2021-11-14 DIAGNOSIS — Z20822 Contact with and (suspected) exposure to covid-19: Secondary | ICD-10-CM | POA: Insufficient documentation

## 2021-11-14 DIAGNOSIS — F1920 Other psychoactive substance dependence, uncomplicated: Secondary | ICD-10-CM

## 2021-11-14 LAB — COMPREHENSIVE METABOLIC PANEL
ALT: 17 U/L (ref 0–44)
AST: 20 U/L (ref 15–41)
Albumin: 3.7 g/dL (ref 3.5–5.0)
Alkaline Phosphatase: 79 U/L (ref 38–126)
Anion gap: 11 (ref 5–15)
BUN: 8 mg/dL (ref 6–20)
CO2: 25 mmol/L (ref 22–32)
Calcium: 8.5 mg/dL — ABNORMAL LOW (ref 8.9–10.3)
Chloride: 98 mmol/L (ref 98–111)
Creatinine, Ser: 0.93 mg/dL (ref 0.61–1.24)
GFR, Estimated: >60 mL/min (ref >60)
Glucose, Bld: 90 mg/dL (ref 70–99)
Potassium: 3.6 mmol/L (ref 3.5–5.1)
Sodium: 134 mmol/L — ABNORMAL LOW (ref 135–145)
Total Bilirubin: 0.3 mg/dL (ref 0.3–1.2)
Total Protein: 6.9 g/dL (ref 6.5–8.1)

## 2021-11-14 LAB — ACETAMINOPHEN LEVEL: Acetaminophen (Tylenol), Serum: <10 ug/mL — ABNORMAL LOW (ref 10–30)

## 2021-11-14 LAB — SALICYLATE LEVEL: Salicylate Lvl: <7 mg/dL — ABNORMAL LOW (ref 7.0–30.0)

## 2021-11-14 LAB — CBC
HCT: 51.6 % (ref 39.0–52.0)
Hemoglobin: 18 g/dL — ABNORMAL HIGH (ref 13.0–17.0)
MCH: 31.4 pg (ref 26.0–34.0)
MCHC: 34.9 g/dL (ref 30.0–36.0)
MCV: 89.9 fL (ref 80.0–100.0)
Platelets: 181 10*3/uL (ref 150–400)
RBC: 5.74 MIL/uL (ref 4.22–5.81)
RDW: 13.3 % (ref 11.5–15.5)
WBC: 8.4 10*3/uL (ref 4.0–10.5)
nRBC: 0 % (ref 0.0–0.2)

## 2021-11-14 LAB — RAPID URINE DRUG SCREEN, HOSP PERFORMED
Amphetamines: NOT DETECTED
Barbiturates: NOT DETECTED
Benzodiazepines: NOT DETECTED
Cocaine: POSITIVE — AB
Opiates: NOT DETECTED
Tetrahydrocannabinol: POSITIVE — AB

## 2021-11-14 LAB — ETHANOL: Alcohol, Ethyl (B): 27 mg/dL — ABNORMAL HIGH (ref <10)

## 2021-11-14 LAB — RESP PANEL BY RT-PCR (FLU A&B, COVID) ARPGX2
Influenza A by PCR: NEGATIVE
Influenza B by PCR: NEGATIVE
SARS Coronavirus 2 by RT PCR: NEGATIVE

## 2021-11-14 MED ORDER — LORAZEPAM 2 MG/ML IJ SOLN: 0.0000 mg | Freq: Two times a day (BID) | INTRAMUSCULAR | Status: DC

## 2021-11-14 MED ORDER — LORAZEPAM 1 MG PO TABS: 0.0000 mg | ORAL_TABLET | Freq: Two times a day (BID) | ORAL | Status: DC

## 2021-11-14 MED ORDER — THIAMINE HCL 100 MG PO TABS
100.0000 mg | ORAL_TABLET | Freq: Every day | ORAL | Status: DC
  Administered 2021-11-14: 100 mg via ORAL
  Filled 2021-11-14: qty 1

## 2021-11-14 MED ORDER — LORAZEPAM 2 MG/ML IJ SOLN: 0.0000 mg | Freq: Four times a day (QID) | INTRAMUSCULAR | Status: DC

## 2021-11-14 MED ORDER — LORAZEPAM 1 MG PO TABS
0.0000 mg | ORAL_TABLET | Freq: Four times a day (QID) | ORAL | Status: DC
  Administered 2021-11-14: 2 mg via ORAL
  Filled 2021-11-14: qty 2

## 2021-11-14 MED ORDER — THIAMINE HCL 100 MG/ML IJ SOLN: 100.0000 mg | Freq: Every day | INTRAMUSCULAR | Status: DC

## 2021-11-14 NOTE — ED Notes (Signed)
TTS at bedside. 

## 2021-11-14 NOTE — Progress Notes (Addendum)
Accepted to [facility].  ? ?Fransico Michael, RN, [facility] requested that Vol consent be sent to [phone]. Bed Assignment 406-2 by Melbourne Abts, PA-C ? ?Attending: Dr. Sherron Flemings  ? ?Care Team notified:  Christella Scheuermann, RN, Fransico Michael, RN, [facility] AC, Christina Hussami, LCSW. ? ? ?Maryjean Ka, MSW, LCSWA ?11/14/2021 11:49 PM ? ? ?

## 2021-11-14 NOTE — BH Assessment (Addendum)
@  1222, requested patient's nurse Sherrilyn Rist, RN) to set up the TTS machine for patient's initial TTS assessment. Awaiting response from his nurse. ?

## 2021-11-14 NOTE — ED Provider Notes (Signed)
?[facility] DEPT ?Provider Note ? ? ?CSN: [id number] ?Arrival date & time: 11/14/21  [id number] ? ?  ? ?History ? ?Chief Complaint  ?Patient presents with  ?? Suicidal  ? ? ?Brian Nolan is a 41 y.o. male with a past medical history of alcohol use disorder, cannabis use disorder, crack cocaine use disorder presenting today with complaint of SI.  Reports that he has been feeling like "I do not want to be here anymore."  He recently broke up with his girlfriend and she found him with a gun in his kitchen table saying that he wanted to end his life.  He was told that they would call 911 if he did not go to the hospital.  Reports that he still feels like he does not want to be here anymore and if he leaves he will probably use a gun to end his life.  Denies AVH.  Says that a few months ago he relapsed after 4 years of sobriety from cannabis, cocaine and alcohol.  He believes his triggers were his mother being diagnosed with stage IV lung cancer last year and problems with his daughter.  He reports that he has been drinking all day every day.  Last drink just prior to arrival.  Denies any HI. ? ?Home Medications ?Prior to Admission medications   ?Medication Sig Start Date End Date Taking? Authorizing Provider  ?acetaminophen (TYLENOL) 325 MG tablet Take 2 tablets (650 mg total) by mouth every 6 (six) hours as needed for mild pain (or Fever >/= 101). 05/26/18   Geradine Girt, DO  ?doxycycline (VIBRAMYCIN) 100 MG capsule Take 1 capsule (100 mg total) by mouth 2 (two) times daily. 05/26/18   Geradine Girt, DO  ?FLUoxetine (PROZAC) 20 MG capsule Take 1 capsule (20 mg total) by mouth daily. ?Patient not taking: Reported on 05/31/2018 02/01/17   Patrecia Pour, NP  ?hydrOXYzine (ATARAX/VISTARIL) 25 MG tablet Take 1 tablet (25 mg total) by mouth every 6 (six) hours as needed (anxiety/agitation or CIWA < or = 10). ?Patient not taking: Reported on 05/31/2018 02/01/17   Patrecia Pour, NP   ?thiamine 100 MG tablet Take 1 tablet (100 mg total) by mouth daily. 06/18/16   Withrow, Elyse Jarvis, FNP  ?traZODone (DESYREL) 50 MG tablet Take 1 tablet (50 mg total) by mouth at bedtime as needed for sleep. ?Patient not taking: Reported on 05/31/2018 02/01/17   Patrecia Pour, NP  ?   ? ?Allergies    ?Patient has no known allergies.   ? ?Review of Systems   ?Review of Systems ? ?Physical Exam ?Updated Vital Signs ?BP (!) 111/98 (BP Location: Right Arm)   Pulse 98   Temp 98.2 ?F (36.8 ?C) (Oral)   Resp 16   SpO2 99%  ?Physical Exam ?Vitals and nursing note reviewed.  ?Constitutional:   ?   Appearance: Normal appearance.  ?HENT:  ?   Head: Normocephalic and atraumatic.  ?Eyes:  ?   General: No scleral icterus. ?   Conjunctiva/sclera: Conjunctivae normal.  ?Pulmonary:  ?   Effort: Pulmonary effort is normal. No respiratory distress.  ?Skin: ?   Findings: No rash.  ?Neurological:  ?   Mental Status: He is alert.  ?Psychiatric:  ?   Comments: Depressed, teary throughout history taking  ? ? ?ED Results / Procedures / Treatments   ?Labs ?(all labs ordered are listed, but only abnormal results are displayed) ?Labs Reviewed  ?CBC - Abnormal; Notable  for the following components:  ?    Result Value  ? Hemoglobin 18.0 (*)   ? All other components within normal limits  ?RAPID URINE DRUG SCREEN, HOSP PERFORMED - Abnormal; Notable for the following components:  ? Cocaine POSITIVE (*)   ? Tetrahydrocannabinol POSITIVE (*)   ? All other components within normal limits  ?RESP PANEL BY RT-PCR (FLU A&B, COVID) [id number]  ?COMPREHENSIVE METABOLIC PANEL  ?ETHANOL  ?SALICYLATE LEVEL  ?ACETAMINOPHEN LEVEL  ? ? ?EKG ?None ? ?Radiology ?No results found. ? ?Procedures ?Procedures  ? ?Medications Ordered in ED ?Medications  ?LORazepam (ATIVAN) injection 0-4 mg (has no administration in time range)  ?  Or  ?LORazepam (ATIVAN) tablet 0-4 mg (has no administration in time range)  ?LORazepam (ATIVAN) injection 0-4 mg (has no administration in time  range)  ?  Or  ?LORazepam (ATIVAN) tablet 0-4 mg (has no administration in time range)  ?thiamine tablet 100 mg (has no administration in time range)  ?  Or  ?thiamine (B-1) injection 100 mg (has no administration in time range)  ? ? ?ED Course/ Medical Decision Making/ A&P ?Clinical Course as of 11/14/21 1350  ?Thu Nov 14, 2021  ?64 41 yo male here with SI, reporting stressors in his life, mother is terminally ill and girlfriend left him recently.  Patient threatening to kill himself with a gun he has in the house.  Workup here showing +cocaine, THC.  Otherwise medical labs wnl, and he is medically cleared for psych evaluation.  He is here voluntarily but high risk for self-harm, and if he attempts to leave may require IVC. [MT]  ?0950 Patient resting comfortably on stretcher [MR]  ?1350 Continues to rest comfortably on stretcher, awaiting TTS [MR]  ?  ?Clinical Course User Index ?[MR] Citlalic Norlander A, PA-C ?[MT] Wyvonnia Dusky, MD  ? ?                        ?Medical Decision Making ?Amount and/or Complexity of Data Reviewed ?Labs: ordered. ? ?Risk ?OTC drugs. ?Prescription drug management. ? ? ?41 year old male presented with SI.  Appears to be in response to many social stressors.  Denies any psychiatric medications.  He is also concerned about his relapse.  Says that he has a plan to end his life.  He is requesting help and reports that he does not want to live his life depressed. ? ?Per chart review, patient presented to the ED with depression and SI in 2017 however has no psychiatric visits since. ? ?Patient's UDS positive for THC and cocaine as expected.  Ethanol 27.  He is clinically sober and able to participate in TTS evaluation.  He has been placed under CIWA precautions for any potential alcohol withdrawal. ? ?Patient understands that if he tries to leave IVC paperwork will be filed.  He voiced that he will not leave because he is desperate for help.  Medically clear at this time. ? ?Final  Clinical Impression(s) / ED Diagnoses ?Final diagnoses:  ?Suicidal ideation  ?Polysubstance (excluding opioids) dependence ([place])  ? ? ?Rx / DC Orders ?TTS to dispo, medically cleared at this time. ?  ?Rhae Hammock, PA-C ?11/14/21 [id number] ? ?  ?Wyvonnia Dusky, MD ?11/14/21 1710 ? ?

## 2021-11-14 NOTE — ED Notes (Signed)
Patient changed into burgundy scrubs, personal belongings placed in bag (shirt, jeans, shoes, jacket, black plastic bag with snacks) and stored in cabinet behind 16-18 nurses station. ?
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
## 2021-11-14 NOTE — ED Triage Notes (Signed)
Pt reports with suicidal thoughts for the past few days. Pt states that his girlfriend left him and also says that his mom is dying from stage 4 lung cancer. Pt reports having access to a gun and was about to use it. Pt states that he has been drinking alcohol and doing cocaine.  ?

## 2021-11-14 NOTE — ED Notes (Signed)
Pt currently doing TTS assessment - will update v/s once complete. ?

## 2021-11-15 ENCOUNTER — Encounter (HOSPITAL_COMMUNITY): Payer: Self-pay

## 2021-11-15 ENCOUNTER — Inpatient Hospital Stay (HOSPITAL_COMMUNITY)
Admission: AD | Admit: 2021-11-15 | Discharge: 2021-11-20 | DRG: 885 | Payer: Federal, State, Local not specified - Other | Source: Intra-hospital | Attending: Psychiatry | Admitting: Psychiatry

## 2021-11-15 ENCOUNTER — Encounter (HOSPITAL_COMMUNITY): Payer: Self-pay | Admitting: Student

## 2021-11-15 DIAGNOSIS — Z833 Family history of diabetes mellitus: Secondary | ICD-10-CM | POA: Diagnosis not present

## 2021-11-15 DIAGNOSIS — Z79899 Other long term (current) drug therapy: Secondary | ICD-10-CM

## 2021-11-15 DIAGNOSIS — F1994 Other psychoactive substance use, unspecified with psychoactive substance-induced mood disorder: Secondary | ICD-10-CM

## 2021-11-15 DIAGNOSIS — G4700 Insomnia, unspecified: Secondary | ICD-10-CM | POA: Diagnosis present

## 2021-11-15 DIAGNOSIS — F1010 Alcohol abuse, uncomplicated: Secondary | ICD-10-CM | POA: Diagnosis present

## 2021-11-15 DIAGNOSIS — Z635 Disruption of family by separation and divorce: Secondary | ICD-10-CM

## 2021-11-15 DIAGNOSIS — Z20822 Contact with and (suspected) exposure to covid-19: Secondary | ICD-10-CM | POA: Diagnosis present

## 2021-11-15 DIAGNOSIS — F332 Major depressive disorder, recurrent severe without psychotic features: Secondary | ICD-10-CM | POA: Diagnosis present

## 2021-11-15 DIAGNOSIS — R45851 Suicidal ideations: Secondary | ICD-10-CM | POA: Diagnosis present

## 2021-11-15 DIAGNOSIS — F419 Anxiety disorder, unspecified: Secondary | ICD-10-CM | POA: Diagnosis present

## 2021-11-15 DIAGNOSIS — F1414 Cocaine abuse with cocaine-induced mood disorder: Secondary | ICD-10-CM | POA: Diagnosis present

## 2021-11-15 DIAGNOSIS — Z818 Family history of other mental and behavioral disorders: Secondary | ICD-10-CM

## 2021-11-15 DIAGNOSIS — F17210 Nicotine dependence, cigarettes, uncomplicated: Secondary | ICD-10-CM | POA: Diagnosis present

## 2021-11-15 MED ORDER — TRAZODONE HCL 50 MG PO TABS
50.0000 mg | ORAL_TABLET | Freq: Every evening | ORAL | Status: DC | PRN
  Filled 2021-11-15: qty 1

## 2021-11-15 MED ORDER — ENSURE ENLIVE PO LIQD
237.0000 mL | Freq: Two times a day (BID) | ORAL | Status: DC
  Administered 2021-11-15 – 2021-11-19 (×7): 237 mL via ORAL
  Filled 2021-11-15 (×13): qty 237

## 2021-11-15 MED ORDER — SERTRALINE HCL 25 MG PO TABS
25.0000 mg | ORAL_TABLET | Freq: Every day | ORAL | Status: DC
  Administered 2021-11-16 – 2021-11-17 (×2): 25 mg via ORAL
  Filled 2021-11-15 (×4): qty 1

## 2021-11-15 MED ORDER — ONDANSETRON 4 MG PO TBDP: 4.0000 mg | ORAL_TABLET | Freq: Four times a day (QID) | ORAL | Status: AC | PRN

## 2021-11-15 MED ORDER — MAGNESIUM HYDROXIDE 400 MG/5ML PO SUSP: 30.0000 mL | Freq: Every day | ORAL | Status: DC | PRN

## 2021-11-15 MED ORDER — LORAZEPAM 1 MG PO TABS: 1.0000 mg | ORAL_TABLET | Freq: Four times a day (QID) | ORAL | Status: AC | PRN

## 2021-11-15 MED ORDER — HYDROXYZINE HCL 25 MG PO TABS: 25.0000 mg | ORAL_TABLET | Freq: Three times a day (TID) | ORAL | Status: DC | PRN

## 2021-11-15 MED ORDER — ZIPRASIDONE MESYLATE 20 MG IM SOLR
20.0000 mg | INTRAMUSCULAR | Status: DC | PRN
Start: 2021-11-15 — End: 2021-11-20

## 2021-11-15 MED ORDER — MIRTAZAPINE 7.5 MG PO TABS
7.5000 mg | ORAL_TABLET | Freq: Every day | ORAL | Status: DC
  Administered 2021-11-15 – 2021-11-17 (×3): 7.5 mg via ORAL
  Filled 2021-11-15 (×5): qty 1

## 2021-11-15 MED ORDER — ACETAMINOPHEN 325 MG PO TABS: 650.0000 mg | ORAL_TABLET | Freq: Four times a day (QID) | ORAL | Status: DC | PRN

## 2021-11-15 MED ORDER — HYDROXYZINE HCL 25 MG PO TABS
25.0000 mg | ORAL_TABLET | Freq: Four times a day (QID) | ORAL | Status: AC | PRN
  Administered 2021-11-16 – 2021-11-17 (×2): 25 mg via ORAL
  Filled 2021-11-15 (×3): qty 1

## 2021-11-15 MED ORDER — LORAZEPAM 1 MG PO TABS
1.0000 mg | ORAL_TABLET | ORAL | Status: DC | PRN
Start: 2021-11-15 — End: 2021-11-20

## 2021-11-15 MED ORDER — ALUM & MAG HYDROXIDE-SIMETH 200-200-20 MG/5ML PO SUSP: 30.0000 mL | ORAL | Status: DC | PRN

## 2021-11-15 MED ORDER — OLANZAPINE 5 MG PO TBDP: 5.0000 mg | ORAL_TABLET | Freq: Three times a day (TID) | ORAL | Status: DC | PRN

## 2021-11-15 MED ORDER — ADULT MULTIVITAMIN W/MINERALS CH
1.0000 | ORAL_TABLET | Freq: Every day | ORAL | Status: DC
  Administered 2021-11-15 – 2021-11-20 (×6): 1 via ORAL
  Filled 2021-11-15 (×7): qty 1

## 2021-11-15 MED ORDER — LOPERAMIDE HCL 2 MG PO CAPS: 2.0000 mg | ORAL_CAPSULE | ORAL | Status: AC | PRN

## 2021-11-15 MED ORDER — THIAMINE HCL 100 MG PO TABS
100.0000 mg | ORAL_TABLET | Freq: Every day | ORAL | Status: DC
  Administered 2021-11-15 – 2021-11-20 (×6): 100 mg via ORAL
  Filled 2021-11-15 (×7): qty 1
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
## 2021-11-15 NOTE — Group Note (Signed)
Recreation Therapy Group Note ? ? ?Group Topic:Stress Management  ?Group Date: 11/15/2021 ?Start Time: 57 ?End Time: 0950 ?Facilitators: Caroll Rancher, LRT,CTRS ?Location: [address] Day[address] ? ? ?Goal Area(s) Addresses:  ?Patient will identify positive stress management techniques. ?Patient will identify benefits of using stress management post d/c. ? ?Group Description:  Meditation.  LRT played a meditation that focused on having gratitude for what you have and what you have to offer to the world instead of focusing on the things you lack.  Patients were to listen and follow along as meditation played to engage in the activity. ? ? ?Affect/Mood: N/A ?  ?Participation Level: Did not attend ?  ? ?Clinical Observations/Individualized Feedback:   ? ? ?Plan: Continue to engage patient in RT group sessions 2-3x/week. ? ? ?Caroll Rancher, LRT,CTRS ?11/15/2021 11:04 AM ?

## 2021-11-15 NOTE — Group Note (Signed)
Date:  11/15/2021 ?Time:  10:13 AM ? ?Group Topic/Focus:  ?Orientation:   The focus of this group is to educate the patient on the purpose and policies of crisis stabilization and provide a format to answer questions about their admission.  The group details unit policies and expectations of patients while admitted. ? ? ? ?Participation Level:  Did Not Attend ? ?Participation Quality:   ? ?Affect:   ? ?Cognitive:   ? ?Insight:  ? ?Engagement in Group:   ? ?Modes of Intervention:   ? ?Additional Comments:   ? ?Jaquita Rector ?11/15/2021, 10:13 AM ? ?

## 2021-11-15 NOTE — Progress Notes (Signed)
NUTRITION ASSESSMENT ? ?Pt identified as at risk on the Malnutrition Screen Tool ? ?INTERVENTION: ?1. Supplements: Ensure Plus High Protein po BID, each supplement provides 350 kcal and 20 grams of protein.  ? ?NUTRITION DIAGNOSIS: ?Unintentional weight loss related to sub-optimal intake as evidenced by pt report.  ? ?Goal: ?Pt to meet >/= 90% of their estimated nutrition needs. ? ?Monitor:  ?PO intake ? ?Assessment:  ?Pt admitted with SI. Pt reports bingeing on alcohol and drugs (crack cocaine,THC) recently. Reports losing 80 lbs over the past year. ?Ensure supplements have been ordered. Daily MVI and thiamine ordered as well. ? ?Height: ?Ht Readings from Last 1 Encounters:  ?11/15/21 5' 8.5" (1.74 m)  ? ? ?Weight: ?Wt Readings from Last 1 Encounters:  ?11/15/21 80.7 kg  ? ? ?Weight Hx: ?Wt Readings from Last 10 Encounters:  ?11/15/21 80.7 kg  ?05/31/18 102.7 kg  ?05/25/18 102.7 kg  ?05/24/18 103.3 kg  ?06/15/16 83 kg  ?04/24/16 83 kg  ?04/24/16 86.2 kg  ?10/10/15 90.7 kg  ?08/04/15 87.5 kg  ? ? ?BMI:  Body mass index is 26.67 kg/m?Marland Kitchen ?Pt meets criteria for normal based on current BMI. ? ?Estimated Nutritional Needs: ?Kcal: 25-30 kcal/kg ?Protein: > 1 gram protein/kg ?Fluid: 1 ml/kcal ? ?Diet Order:  ?Diet Order   ? ?       ?  Diet regular Room service appropriate? Yes; Fluid consistency: Thin  Diet effective now       ?  ? ?  ?  ? ?  ? ?Pt is also offered choice of unit snacks mid-morning and mid-afternoon.  ?Pt is eating as desired.  ? ?Lab results and medications reviewed.  ? ?Tilda Franco, MS, RD, LDN ?Inpatient Clinical Dietitian ?Contact information available via Amion ? ? ?
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
## 2021-11-15 NOTE — Progress Notes (Signed)
Patient ID: Brian Nolan, male   DOB: 1981-05-31, 41 y.o.   MRN: [id number] ? ?Admission Note:  ?41 yr male who presents [facility] in no acute distress for the treatment of SI and Depression. Pt appears flat and depressed. Pt was calm and cooperative with admission process. Pt presents with passive SI and contracts for safety upon admission. Pt denies AVH/ HI/ pain at this time .  Pt stated that his mom was getting worse and he relapsed and while he was drinking he had a gun in the kitchen stating he was going to end it, but his neighbor called his ex and when she got there she stated he needed to go to the hospital or she was going to call the police, so he was brought to the hospital. Pt stated he would like to go to SA rehab on D/C like [facility].  ? ?Per Assessment: ?presenting voluntarily to [facility] for evaluation of suicidal ideation. Pt was found by his girlfriend in his kitchen with a firearm stating that he wanted to use it to end his life. Pt is under distress over recent conflicts with girlfriend and finding out his mother has Stage 4 cancer. Pt reports that he has been on alcohol and drug binges recently and is not taking care of himself. Pt reports that he has lost 80 lbs in 1 year.  Pt reports that he is not taking any psychotropic medication and does not have a psychiatrist or psychotherapist. Pt reports that he currently has suicidal ideation with plan and if he is released from the hospital he will go home and "finish the job". Pt reports that sometimes he does have HI--especially if his girlfriend has been speaking in a demeaning way to him. Pt reports that he has been "seeing shadows" and "hearing voices" but mostly when he is under the influence of crack cocaine.  Pt admits that he has been using crack cocaine (binging) and drinking alcohol (binging) and smoking THC for several months. Pt has been sober in the past for 3 years. Pt feels he is currently a danger to himself. ? ? ? ?A: Skin was  assessed and found to be clear of any abnormal marks apart from a abrasion L-chest and multiple tattoos. PT searched and no contraband found, POC and unit policies explained and understanding verbalized. Consents obtained. Food and fluids offered, and  accepted.  ? ?R:Pt had no additional questions or concerns.  ?

## 2021-11-15 NOTE — BH IP Treatment Plan (Signed)
Interdisciplinary Treatment and Diagnostic Plan Update ? ?11/15/2021 ?Time of Session: 11;25am ?Brian Nolan ?MRN: [id number] ? ?Principal Diagnosis: Substance induced mood disorder ([place]) ? ?Secondary Diagnoses: Principal Problem: ?  Substance induced mood disorder ([place]) ? ? ?Current Medications:  ?Current Facility-Administered Medications  ?Medication Dose Route Frequency Provider Last Rate Last Admin  ? acetaminophen (TYLENOL) tablet 650 mg  650 mg Oral Q6H PRN Prescilla Sours, PA-C      ? alum & mag hydroxide-simeth (MAALOX/MYLANTA) 200-200-20 MG/5ML suspension 30 mL  30 mL Oral Q4H PRN Margorie Dakai W, PA-C      ? feeding supplement (ENSURE ENLIVE / ENSURE PLUS) liquid 237 mL  237 mL Oral BID BM Massengill, Ovid Curd, MD   237 mL at 11/15/21 1245  ? hydrOXYzine (ATARAX) tablet 25 mg  25 mg Oral Q6H PRN Prescilla Sours, PA-C      ? loperamide (IMODIUM) capsule 2-4 mg  2-4 mg Oral PRN Prescilla Sours, PA-C      ? OLANZapine zydis (ZYPREXA) disintegrating tablet 5 mg  5 mg Oral Q8H PRN Prescilla Sours, PA-C      ? And  ? LORazepam (ATIVAN) tablet 1 mg  1 mg Oral PRN Prescilla Sours, PA-C      ? And  ? ziprasidone (GEODON) injection 20 mg  20 mg Intramuscular PRN Prescilla Sours, PA-C      ? LORazepam (ATIVAN) tablet 1 mg  1 mg Oral Q6H PRN Prescilla Sours, PA-C      ? magnesium hydroxide (MILK OF MAGNESIA) suspension 30 mL  30 mL Oral Daily PRN Prescilla Sours, PA-C      ? multivitamin with minerals tablet 1 tablet  1 tablet Oral Daily Prescilla Sours, PA-C   1 tablet at 11/15/21 0753  ? ondansetron (ZOFRAN-ODT) disintegrating tablet 4 mg  4 mg Oral Q6H PRN Prescilla Sours, PA-C      ? thiamine tablet 100 mg  100 mg Oral Daily Margorie Jerrick W, PA-C   100 mg at 11/15/21 0753  ? traZODone (DESYREL) tablet 50 mg  50 mg Oral QHS PRN Prescilla Sours, PA-C      ? ?PTA Medications: ?No medications prior to admission.  ? ? ?Patient Stressors: Marital or family conflict   ?Medication change or noncompliance   ? ?Patient  Strengths: General fund of knowledge  ?Motivation for treatment/growth  ? ?Treatment Modalities: Medication Management, Group therapy, Case management,  ?1 to 1 session with clinician, Psychoeducation, Recreational therapy. ? ? ?Physician Treatment Plan for Primary Diagnosis: Substance induced mood disorder ([place]) ?Long Term Goal(s):    ? ?Short Term Goals:   ? ?Medication Management: Evaluate patient's response, side effects, and tolerance of medication regimen. ? ?Therapeutic Interventions: 1 to 1 sessions, Unit Group sessions and Medication administration. ? ?Evaluation of Outcomes: Not Met ? ?Physician Treatment Plan for Secondary Diagnosis: Principal Problem: ?  Substance induced mood disorder ([place]) ? ?Long Term Goal(s):    ? ?Short Term Goals:      ? ?Medication Management: Evaluate patient's response, side effects, and tolerance of medication regimen. ? ?Therapeutic Interventions: 1 to 1 sessions, Unit Group sessions and Medication administration. ? ?Evaluation of Outcomes: Not Met ? ? ?RN Treatment Plan for Primary Diagnosis: Substance induced mood disorder ([place]) ?Long Term Goal(s): Knowledge of disease and therapeutic regimen to maintain health will improve ? ?Short Term Goals: Ability to remain free from injury will improve, Ability to verbalize frustration and anger appropriately  will improve, Ability to demonstrate self-control, Ability to participate in decision making will improve, Ability to disclose and discuss suicidal ideas, Ability to identify and develop effective coping behaviors will improve, and Compliance with prescribed medications will improve ? ?Medication Management: RN will administer medications as ordered by provider, will assess and evaluate patient's response and provide education to patient for prescribed medication. RN will report any adverse and/or side effects to prescribing provider. ? ?Therapeutic Interventions: 1 on 1 counseling sessions, Psychoeducation, Medication  administration, Evaluate responses to treatment, Monitor vital signs and CBGs as ordered, Perform/monitor CIWA, COWS, AIMS and Fall Risk screenings as ordered, Perform wound care treatments as ordered. ? ?Evaluation of Outcomes: Not Met ? ? ?LCSW Treatment Plan for Primary Diagnosis: Substance induced mood disorder ([place]) ?Long Term Goal(s): Safe transition to appropriate next level of care at discharge, Engage patient in therapeutic group addressing interpersonal concerns. ? ?Short Term Goals: Engage patient in aftercare planning with referrals and resources, Increase social support, Increase ability to appropriately verbalize feelings, Increase emotional regulation, Facilitate patient progression through stages of change regarding substance use diagnoses and concerns, Identify triggers associated with mental health/substance abuse issues, and Increase skills for wellness and recovery ? ?Therapeutic Interventions: Assess for all discharge needs, 1 to 1 time with Education officer, museum, Explore available resources and support systems, Assess for adequacy in community support network, Educate family and significant other(s) on suicide prevention, Complete Psychosocial Assessment, Interpersonal group therapy. ? ?Evaluation of Outcomes: Not Met ? ? ?Progress in Treatment: ?Attending groups: No. ?Participating in groups: No. ?Taking medication as prescribed: Yes. ?Toleration medication: Yes. ?Family/Significant other contact made: No, will contact:  declined consents ?Patient understands diagnosis: Yes. ?Discussing patient identified problems/goals with staff: Yes. ?Medical problems stabilized or resolved: Yes. ?Denies suicidal/homicidal ideation: Yes. ?Issues/concerns per patient self-inventory: No. ? ? ?New problem(s) identified: No, Describe:  none ? ?New Short Term/Long Term Goal(s): detox, medication management for mood stabilization; elimination of SI thoughts; development of comprehensive mental wellness/sobriety  plan ? ?Patient Goals:  "To go to rehab" ? ?Discharge Plan or Barriers: Patient recently admitted. CSW will continue to follow and assess for appropriate referrals and possible discharge planning.  ? ? ?Reason for Continuation of Hospitalization: Anxiety ?Homicidal ideation ?Medication stabilization ?Suicidal ideation ?Withdrawal symptoms ? ?Estimated Length of Stay: 3-5 days ? ? ?Scribe for Treatment Team: ?Vassie Moselle, LCSW ?11/15/2021 ?12:39 PM ?

## 2021-11-15 NOTE — [facility] Suicide Risk Assessment (Signed)
[facility] INPATIENT:  Family/Significant Other Suicide Prevention Education ? ?Suicide Prevention Education:  ?Patient Refusal for Family/Significant Other Suicide Prevention Education: The patient Brian Nolan has refused to provide written consent for family/significant other to be provided Family/Significant Other Suicide Prevention Education during admission and/or prior to discharge.  Physician notified. ? ?Akemi Overholser A Dennice Tindol ?11/15/2021, 12:41 PM ?

## 2021-11-15 NOTE — Tx Team (Signed)
Initial Treatment Plan ?11/15/2021 ?4:53 AM ?[name] ?WUJ:[id number] ? ? ? ?PATIENT STRESSORS: ?Marital or family conflict   ?Medication change or noncompliance   ? ? ?PATIENT STRENGTHS: ?General fund of knowledge  ?Motivation for treatment/growth  ? ? ?PATIENT IDENTIFIED PROBLEMS: ? Risk SI  ?SA- ETOH, Cocaine ([place])  ?"Get help, substance abuse issues, go to [facility]"  ?  ?  ?  ?  ?  ?  ?  ? ?DISCHARGE CRITERIA:  ?Improved stabilization in mood, thinking, and/or behavior ?Verbal commitment to aftercare and medication compliance ? ?PRELIMINARY DISCHARGE PLAN: ?Attend aftercare/continuing care group ?Attend PHP/IOP ?Attend 12-step recovery group ? ?PATIENT/FAMILY INVOLVEMENT: ?This treatment plan has been presented to and reviewed with the patient, Brian Nolan.  The patient and family have been given the opportunity to ask questions and make suggestions. ? ?Delos Haring, RN ?11/15/2021, 4:53 AM ?

## 2021-11-15 NOTE — Progress Notes (Signed)
Pt denies HI/AVH but endorses SI with no plan and verbally agrees to approach staff if these become apparent or before harming themselves/others. Rates depression 5/10. Rates anxiety 0/10. Rates pain 0/10.  Pt has been in his room all day and pt stated that he was thinking a lot but about good things and how he will move forward. Pt stated that he look forward to [facility] and how he used to be the one speaking. Pt encouraged and seemed to be in a more positive mood. Scheduled medications administered to pt, per MD orders. RN provided support and encouragement to pt. Q15 min safety checks implemented and continued. Pt safe on the unit. RN will continue to monitor and intervene as needed.  ? 11/15/21 0753  ?Psych Admission Type (Psych Patients Only)  ?Admission Status Voluntary  ?Psychosocial Assessment  ?Patient Complaints Depression  ?Eye Contact Fair  ?Facial Expression Sad  ?Affect Depressed  ?Speech Logical/coherent  ?Interaction Isolative  ?Motor Activity Other (Comment) ?(WDL)  ?Appearance/Hygiene In scrubs  ?Behavior Characteristics Cooperative  ?Mood Depressed;Sad;Pleasant  ?Thought Process  ?Coherency WDL  ?Content Blaming self  ?Delusions None reported or observed  ?Perception WDL  ?Hallucination None reported or observed  ?Judgment WDL  ?Confusion None  ?Danger to Self  ?Current suicidal ideation? Passive  ?Self-Injurious Behavior No self-injurious ideation or behavior indicators observed or expressed   ?Agreement Not to Harm Self Yes  ?Description of Agreement verbal  ?Danger to Others  ?Danger to Others None reported or observed  ? ? ?

## 2021-11-15 NOTE — Group Note (Signed)
LCSW Group Therapy Note ? ? ?Group Date: 11/15/2021 ?Start Time: 1300 ?End Time: 1400 ? ?Type of Therapy and Topic:  Group Therapy:  Healthy and Unhealthy Supports ? ?Participation Level:  Active  ? ?Description of Group:  Patients in this group were introduced to the idea of adding a variety of healthy supports to address the various needs in their lives, especially in reference to their plans and focus for the new year.  Patients discussed what additional healthy supports could be helpful in their recovery and wellness after discharge in order to prevent future hospitalizations.   An emphasis was placed on using counselor, doctor, therapy groups, 12-step groups, and problem-specific support groups to expand supports.   ? ?Therapeutic Goals: ? ? 1)  discuss importance of adding supports to stay well once out of the hospital ? 2)  compare healthy versus unhealthy supports and identify some examples of each ? 3)  generate ideas and descriptions of healthy supports that can be added ? 4)  offer mutual support about how to address unhealthy supports ? 5)  encourage active participation in and adherence to discharge plan ?  ? ?Summary of Patient Progress:  The patient attended group and remained there the entire time.  The Pt followed along with the discussion and was appropriate with their peers.  The Pt demonstrated understanding of the topic being discussed.  The Pt was able to find at least 1 support person or system in their life right now.  ? ?Brian Nolan M Mykael Trott, LCSWA ?11/15/2021  2:07 PM   ? ?

## 2021-11-16 LAB — LIPID PANEL
Cholesterol: 200 mg/dL (ref 0–200)
HDL: 67 mg/dL (ref >40)
LDL Cholesterol: 89 mg/dL (ref 0–99)
Total CHOL/HDL Ratio: 3 RATIO
Triglycerides: 221 mg/dL — ABNORMAL HIGH (ref <150)
VLDL: 44 mg/dL — ABNORMAL HIGH (ref 0–40)

## 2021-11-16 LAB — TSH: TSH: 1.225 u[IU]/mL (ref 0.350–4.500)

## 2021-11-16 LAB — HEMOGLOBIN A1C
Hgb A1c MFr Bld: 5.1 % (ref 4.8–5.6)
Mean Plasma Glucose: 99.67 mg/dL

## 2021-11-16 MED ORDER — TRAZODONE HCL 50 MG PO TABS
50.0000 mg | ORAL_TABLET | Freq: Every day | ORAL | Status: DC
  Administered 2021-11-16: 50 mg via ORAL
  Filled 2021-11-16 (×4): qty 1

## 2021-11-16 NOTE — Group Note (Signed)
LCSW Group Therapy Note ? ?11/16/2021   10:00-11:00am  ? ?Type of Therapy and Topic:  Group Therapy: Anger Cues and Responses ? ?Participation Level:  Active ? ? ?Description of Group:   ?In this group, patients learned how to recognize the physical, cognitive, emotional, and behavioral responses they have to anger-provoking situations.  They identified a recent time they became angry and how they reacted.  They analyzed how their reaction was possibly beneficial and how it was possibly unhelpful.  The group discussed a variety of healthier coping skills that could help with such a situation in the future.  Focus was placed on how helpful it is to recognize the underlying emotions to our anger, because working on those can lead to a more permanent solution as well as our ability to focus on the important rather than the urgent. ? ?Therapeutic Goals: ?Patients will remember their last incident of anger and how they felt emotionally and physically, what their thoughts were at the time, and how they behaved. ?Patients will identify how their behavior at that time worked for them, as well as how it worked against them. ?Patients will explore possible new behaviors to use in future anger situations. ?Patients will learn that anger itself is normal and cannot be eliminated, and that healthier reactions can assist with resolving conflict rather than worsening situations. ? ?Summary of Patient Progress:  The patient shared that his most recent time of anger was when his girlfriend sent him to the store for something but gave him the wrong card to pay for it.  He said she blamed him for it because she is a narcissist and this was angering.  Also his mother hid the severity of her cancer which is Stage IV, but he pretty quickly came to the conclusion that he has to appreciate the time he has with her rather than spending the time angry at her. ? ?Therapeutic Modalities:   ?Cognitive Behavioral Therapy ? ?Carloyn Jaeger Grossman-Orr    ?

## 2021-11-16 NOTE — Progress Notes (Addendum)
[facility] ? ?11/16/2021 1:43 PM ?Brian CosierJohn Anthony Gail Nolan  ?MRN:  [id number] ? ?Subjective:  Brian Nolan reports: "Whatever they gave me last night didn't do anything. I didn't sleep. I slept maybe one or two hours." ? ?Today's assessment (11/16/2021): Pt's chart reviewed, case discussed with the treatment team. As per nurses' notes, pt has been compliant with his medications for the past 24 hrs, but he has remained mostly isolative in his room, and not attending group sessions. Pt stated to Clinical research associatewriter that he attended the AA meeting last night on the unit. Pt with flat affect and depressed mood, attention to personal hygiene and grooming is fair, eye contact is good, speech is clear & coherent. Thought contents are organized and logical, and pt currently denies SI/HI/AVH or paranoia. There is no evidence of delusional thoughts.   ? ?Pt reports a poor sleep quality last night, states that he slept about one or two hours and felt tired in the morning. He reports a good appetite, states that [facility] Pt has been educated that the referral has been made and we might have to wait until Monday to find out which programs have accepted him. He denies having anxiety, and denies being in any physical distress/pain. Trazodone 50 mg will be scheduled nightly for insomnia. Will continue other medications as listed below since pt reports that he is tolerating them well, and just started the Zoloft 25 mg today.  ? ?Labs reviewed: Na is 134. Will repeat CMP to make sure it is trending upwards. Triglycerides are 221, VLDL is 44, pt educated on the importance of a healthy diet and exercise. Will need PCP follow up after discharge.  Hemoglobin A1C, TSH, WNL. EKG with QTC 431 and NSR. Toxicology screen +Cocaine & + THC. Baseline Urinalysis ordered.  ? ?Principal Problem: Major depressive disorder, recurrent episode, severe (HCC) ?Diagnosis: Principal Problem: ?  Major depressive  disorder, recurrent episode, severe (HCC) ?Active Problems: ?  Substance induced mood disorder (HCC) ? ?Total Time spent with patient: 20 minutes ? ?Past Psychiatric History: substance use d/o ? ?Past Medical History:  ?Past Medical History:  ?Diagnosis Date  ? Depression   ? Medical history non-contributory   ? Schizophrenia (HCC)   ? Substance abuse (HCC)   ?  ?Past Surgical History:  ?Procedure Laterality Date  ? NO PAST SURGERIES    ? ?Family History:  ?Family History  ?Problem Relation Age of Onset  ? Schizophrenia Maternal Uncle   ? Diabetes Mellitus II Father   ? ?Family Psychiatric  History: None  reported  ?Social History:  ?Social History  ? ?Substance and Sexual Activity  ?Alcohol Use Yes  ? Alcohol/week: 6.0 - 24.0 standard drinks  ? Types: 6 - 24 Cans of beer per week  ?   ?Social History  ? ?Substance and Sexual Activity  ?Drug Use Yes  ? Types: "Crack" cocaine, Marijuana  ?  ?Social History  ? ?Socioeconomic History  ? Marital status: Single  ?  Spouse name: Not on file  ? Number of children: Not on file  ? Years of education: Not on file  ? Highest education level: Not on file  ?Occupational History  ? Not on file  ?Tobacco Use  ? Smoking status: Former  ?  Packs/day: 1.00  ?  Years: 10.00  ?  Pack years: 10.00  ?  Types: Cigarettes  ?  Quit date: 01/30/2018  ?  Years since  quitting: 3.7  ? Smokeless tobacco: Never  ?Vaping Use  ? Vaping Use: Every day  ?Substance and Sexual Activity  ? Alcohol use: Yes  ?  Alcohol/week: 6.0 - 24.0 standard drinks  ?  Types: 6 - 24 Cans of beer per week  ? Drug use: Yes  ?  Types: "Crack" cocaine, Marijuana  ? Sexual activity: Yes  ?  Birth control/protection: Condom  ?Other Topics Concern  ? Not on file  ?Social History Narrative  ? Not on file  ? ?Social Determinants of Health  ? ?Financial Resource Strain: Not on file  ?Food Insecurity: Not on file  ?Transportation Needs: Not on file  ?Physical Activity: Not on file  ?Stress: Not on file  ?Social Connections: Not on  file  ? ?Sleep: Poor ? ?Appetite:  Good ? ?Current Medications: ?Current Facility-Administered Medications  ?Medication Dose Route Frequency Provider Last Rate Last Admin  ? acetaminophen (TYLENOL) tablet 650 mg  650 mg Oral Q6H PRN Jaclyn Shaggy, PA-C      ? alum & mag hydroxide-simeth (MAALOX/MYLANTA) 200-200-20 MG/5ML suspension 30 mL  30 mL Oral Q4H PRN Melbourne Abts W, PA-C      ? feeding supplement (ENSURE ENLIVE / ENSURE PLUS) liquid 237 mL  237 mL Oral BID BM Massengill, Harrold Donath, MD   237 mL at 11/15/21 1615  ? hydrOXYzine (ATARAX) tablet 25 mg  25 mg Oral Q6H PRN Jaclyn Shaggy, PA-C      ? loperamide (IMODIUM) capsule 2-4 mg  2-4 mg Oral PRN Jaclyn Shaggy, PA-C      ? OLANZapine zydis (ZYPREXA) disintegrating tablet 5 mg  5 mg Oral Q8H PRN Jaclyn Shaggy, PA-C      ? And  ? LORazepam (ATIVAN) tablet 1 mg  1 mg Oral PRN Jaclyn Shaggy, PA-C      ? And  ? ziprasidone (GEODON) injection 20 mg  20 mg Intramuscular PRN Jaclyn Shaggy, PA-C      ? LORazepam (ATIVAN) tablet 1 mg  1 mg Oral Q6H PRN Melbourne Abts W, PA-C      ? magnesium hydroxide (MILK OF MAGNESIA) suspension 30 mL  30 mL Oral Daily PRN Jaclyn Shaggy, PA-C      ? mirtazapine (REMERON) tablet 7.5 mg  7.5 mg Oral QHS Lauro Franklin, MD   7.5 mg at 11/15/21 2135  ? multivitamin with minerals tablet 1 tablet  1 tablet Oral Daily Jaclyn Shaggy, PA-C   1 tablet at 11/16/21 0831  ? ondansetron (ZOFRAN-ODT) disintegrating tablet 4 mg  4 mg Oral Q6H PRN Jaclyn Shaggy, PA-C      ? sertraline (ZOLOFT) tablet 25 mg  25 mg Oral Daily Lauro Franklin, MD   25 mg at 11/16/21 0830  ? thiamine tablet 100 mg  100 mg Oral Daily Melbourne Abts W, PA-C   100 mg at 11/16/21 0831  ? traZODone (DESYREL) tablet 50 mg  50 mg Oral QHS Starleen Blue, NP      ? ? ?Lab Results:  ?Results for orders placed or performed during the hospital encounter of 11/15/21 (from the past 48 hour(s))  ?Hemoglobin A1c     Status: None  ? Collection Time: 11/16/21  6:36 AM   ?Result Value Ref Range  ? Hgb A1c MFr Bld 5.1 4.8 - 5.6 %  ?  Comment: (NOTE) ?Pre diabetes:          5.7%-6.4% ? ?Diabetes:              >  6.4% ? ?Glycemic control for   <7.0% ?adults with diabetes ?  ? Mean Plasma Glucose 99.67 mg/dL  ?  Comment: Performed at [facility], [address] [place], [place] [address]  ?Lipid panel     Status: Abnormal  ? Collection Time: 11/16/21  6:36 AM  ?Result Value Ref Range  ? Cholesterol 200 0 - 200 mg/dL  ? Triglycerides 221 (H) <150 mg/dL  ? HDL 67 >40 mg/dL  ? Total CHOL/HDL Ratio 3.0 RATIO  ? VLDL 44 (H) 0 - 40 mg/dL  ? LDL Cholesterol 89 0 - 99 mg/dL  ?  Comment:        ?Total Cholesterol/HDL:CHD Risk ?Coronary Heart Disease Risk Table ?                    Men   Women ? 1/2 Average Risk   3.4   3.3 ? Average Risk       5.0   4.4 ? 2 X Average Risk   9.6   7.1 ? 3 X Average Risk  23.4   11.0 ?       ?Use the calculated Patient Ratio ?above and the CHD Risk Table ?to determine the patient's CHD Risk. ?       ?ATP III CLASSIFICATION (LDL): ? <100     mg/dL   Optimal ? 604-540  mg/dL   Near or Above ?                   Optimal ? 130-159  mg/dL   Borderline ? 160-189  mg/dL   High ? >981     mg/dL   Very High ?Performed at [facility], [address], [place], [place] [address] ?  ?TSH     Status: None  ? Collection Time: 11/16/21  6:36 AM  ?Result Value Ref Range  ? TSH 1.225 0.350 - 4.500 uIU/mL  ?  Comment: Performed by a 3rd Generation assay with a functional sensitivity of <=0.01 uIU/mL. ?Performed at [facility], [address] [place], [place] [address] ?  ? ? ?Blood Alcohol level:  ?Lab Results  ?Component Value Date  ? ETH 27 (H) 11/14/2021  ? ETH 137 (H) 01/31/2017  ? ? ?Metabolic Disorder Labs: ?Lab Results  ?Component Value Date  ? HGBA1C 5.1 11/16/2021  ? MPG 99.67 11/16/2021  ? MPG 108 10/12/2015  ? ?No results found for: PROLACTIN ?Lab Results  ?Component Value Date  ? CHOL 200 11/16/2021  ? TRIG 221 (H)  11/16/2021  ? HDL 67 11/16/2021  ? CHOLHDL 3.0 11/16/2021  ? VLDL 44 (H) 11/16/2021  ? LDLCALC 89 11/16/2021  ? LDLCALC UNABLE TO CALCULATE IF TRIGLYCERIDE OVER 400 mg/dL 21/30/8657  ? ? ?Physical Findings: ?AIMS: Facial

## 2021-11-16 NOTE — Progress Notes (Signed)
? ?  Patient has been up and active on the unit, attended group this evening and has voiced no complaints. Patient currently denies having pain, -si/hi/a/v hall. Support and encouragement offered, safety maintained on unit, will continue to monitor. ? ? 11/15/21 2145  ?Psych Admission Type (Psych Patients Only)  ?Admission Status Voluntary  ?Psychosocial Assessment  ?Patient Complaints Depression  ?Eye Contact Fair  ?Facial Expression Flat  ?Affect Depressed  ?Speech Logical/coherent  ?Interaction Minimal  ?Motor Activity Slow  ?Appearance/Hygiene In scrubs  ?Behavior Characteristics Cooperative  ?Mood Pleasant  ?Thought Process  ?Coherency WDL  ?Content Blaming self  ?Delusions WDL  ?Perception WDL  ?Hallucination None reported or observed  ?Judgment WDL  ?Confusion None  ?Danger to Self  ?Current suicidal ideation? Passive  ?Self-Injurious Behavior No self-injurious ideation or behavior indicators observed or expressed   ?Agreement Not to Harm Self Yes  ?Description of Agreement verbally  ?Danger to Others  ?Danger to Others None reported or observed  ? ? ?

## 2021-11-17 MED ORDER — TRAZODONE HCL 100 MG PO TABS
100.0000 mg | ORAL_TABLET | Freq: Every day | ORAL | Status: DC
  Administered 2021-11-17 – 2021-11-19 (×3): 100 mg via ORAL
  Filled 2021-11-17 (×4): qty 1
  Filled 2021-11-17: qty 14

## 2021-11-17 MED ORDER — SERTRALINE HCL 50 MG PO TABS
50.0000 mg | ORAL_TABLET | Freq: Every day | ORAL | Status: DC
  Administered 2021-11-18 – 2021-11-20 (×3): 50 mg via ORAL
  Filled 2021-11-17 (×2): qty 1
  Filled 2021-11-17: qty 14
  Filled 2021-11-17: qty 1

## 2021-11-17 NOTE — Progress Notes (Signed)
Patient has been pleasant and Denies SI/HI/A/VH and verbally contracted for safety. Compliant with mediations and programming. Q 15 minutes safety checks ongoing without self harm gestures.  ? ?Support and encouragement provided ?

## 2021-11-17 NOTE — Progress Notes (Signed)
Patient has been up and active on the unit, attended group this evening and has voiced no complaints. Patient currently denies having pain, -si/hi/a/v hall. Support and encouragement offered, safety maintained on unit, will continue to monitor. ?He was informed of his medications to aid with sleep and took them tonight in hopes of resting better. Support given and safety maintained with 15 min checks. ?

## 2021-11-17 NOTE — Group Note (Signed)
[facility] LCSW Group Therapy Note ? ?Date/Time:  11/17/2021  10:00AM-11:00AM ? ?Type of Therapy and Topic:  Group Therapy:  Music's Effect on Depression and Anxiety ? ?Participation Level:  Active  ? ?Description of Group: ?In this process group, members discussed what types of music trigger them to worsening mental health symptoms and what they can do about this in the future.  For instance, we discussed what to do when riding in a friend's car and a song harmful to the patient starts playing.  We also discussed how music can be used as a tool to help with wellness and recovery in various ways including managing depression and anxiety as well as encouraging healthy sleep habits and avoiding relapse into old negative behaviors such as drinking, self-harming, or staying in bed all day.  A variety of songs were played as examples.  Discussion was elicited which showed the group to be in agreement that the songs were quite relatable and have the potential to help them outside of the hospital setting. ? ?Therapeutic Goals: ?Patients will be introduced to a variety of songs that can relate to self-image and self-love in a helpful way. ?Patients will explore the impact of different pieces of music on their feelings, i.e. uplifting, triggering, etc. ?Patients will discuss how to use this self-knowledge to assist in recovery. ? ?Summary of Patient Progress:  At the beginning of group, patient expressed that anything about losing somebody is triggering for his sadness, but he likes everything else.  At the end of group patient reported that he felt better and had liked the song selections. ? ?Therapeutic Modalities: ?Solution Focused Brief Therapy ?Activity ? ? ?Ambrose Mantle, LCSW ? ?  ?

## 2021-11-17 NOTE — Progress Notes (Signed)
Pt visible on the unit much of the evening, pt stated he felt much better since coming to the hospital, pt stated he would like to go to [facility] on D/C , but would like to go straight from [facility] to [facility] for fear of relapsing .  ? ? ? 11/17/21 2100  ?Psych Admission Type (Psych Patients Only)  ?Admission Status Voluntary  ?Psychosocial Assessment  ?Patient Complaints Depression  ?Eye Contact Fair  ?Facial Expression Sad  ?Affect Depressed  ?Speech Logical/coherent  ?Interaction Cautious  ?Motor Activity Slow  ?Appearance/Hygiene Disheveled  ?Behavior Characteristics Cooperative  ?Mood Pleasant  ?Aggressive Behavior  ?Effect No apparent injury  ?Thought Process  ?Coherency WDL  ?Content Blaming self  ?Delusions WDL  ?Perception WDL  ?Hallucination None reported or observed  ?Judgment WDL  ?Confusion None  ?Danger to Self  ?Current suicidal ideation? Denies  ?Danger to Others  ?Danger to Others None reported or observed  ? ? ?

## 2021-11-17 NOTE — Progress Notes (Signed)
Adult Psychoeducational Group Note ? ?Date:  11/17/2021 ?Time:  9:57 PM ? ?Group Topic/Focus:  ?Wrap-Up Group:   The focus of this group is to help patients review their daily goal of treatment and discuss progress on daily workbooks. ? ?Participation Level:  Active ? ?Participation Quality:  Appropriate ? ?Affect:  Appropriate ? ?Cognitive:  Appropriate ? ?Insight: Appropriate ? ?Engagement in Group:  Engaged ? ?Modes of Intervention:  Discussion ? ?Additional Comments:  Pt stated his goal for today was to focus on his treatment plan and interact more with staff and his peers. Pt stated he accomplished his goals today. Pt stated he talked with his doctor and social worker about his care today. Pt rated his overall day a 7 out of 10. Pt stated he was able to contact a family friend today which improved her overall day. Pt stated he felt better about himself today. Pt stated he was able to attend all meals. Pt stated he took all medications provided today. Pt stated he attend all groups held today. Pt stated his appetite was improved today. Pt rated sleep last night was poor. Pt stated the goal tonight was to get some rest. Pt stated he had no physical pain tonight. Pt deny visual hallucinations and auditory issues tonight. Pt denies thoughts of harming himself or others. Pt stated he would alert staff if anything changed ? ?Felipa Furnace ?11/17/2021, 9:57 PM ?

## 2021-11-17 NOTE — Progress Notes (Signed)
[facility] MD Progress Note ? ?11/17/2021 2:54 PM ?Brian Nolan  ?MRN:  [id number] ? ?Subjective:  Brian Nolan reports: "I feel a little drowsy now, and I wake up feeling hungry on the medicines that they've got me on." ? ?Today's assessment (11/17/2021): Pt's chart reviewed, case discussed with the treatment team. As per nursing documentation, pt has been compliant with his medications for the past 24 hrs, and slept only for a total of 3.75 hrs last night. Pt with flat affect and depressed mood, attention to personal hygiene and grooming is fair, eye contact is good, speech is clear & coherent. Thought contents are organized and logical, and pt currently denies SI/HI/AVH or paranoia. There is no evidence of delusional thoughts.   ? ?Pt reports a fair sleep quality last night even though he slept for only 3.75 hrs as per flow sheets.  He reported feeling drowsy in the morning, and stated that the medications make him feel hungry. Pt educated that the Remeron is most likely making him have an urge to eat since it helps with appetite, insomnia & depression. Pt asking for Ensure shakes. He reports having a low energy level today, but is able to attend group sessions. He is visible on the unit interacting with peers and staff. Pt denies being in any physical pain. Will increase Zoloft to 50 mg to manage depressive symptoms, and will increase Trazodone to 100 mg to manage insomnia. ? ?Labs reviewed: Na is 134. Repeat CMP to make sure it is trending upwards is pending. Triglycerides are 221, VLDL is 44, pt educated on the importance of a healthy diet and exercise. Will need PCP follow up after discharge.  Hemoglobin A1C, TSH, WNL. EKG with QTC 431 and NSR. Toxicology screen +Cocaine & + THC. Baseline Urinalysis ordered.  ? ?HPI: Brian Nolan is a 41 yr old male who presented on 3/16 to [facility] with SI and worsening depression in the setting of substance use, he was admitted to [facility] on 3/17.  PPHx is significant for Substance  Induced Mood Disorder, Polysubtance use (Crack-cocaine, EtOH, THC) and multiple hospitalizations (latest [facility] 2017). ? ?Principal Problem: Major depressive disorder, recurrent episode, severe ([place]) ?Diagnosis: Principal Problem: ?  Major depressive disorder, recurrent episode, severe ([place]) ?Active Problems: ?  Substance induced mood disorder ([place]) ? ?Total Time spent with patient: 20 minutes ? ?Past Psychiatric History: substance use d/o ? ?Past Medical History:  ?Past Medical History:  ?Diagnosis Date  ? Depression   ? Medical history non-contributory   ? Schizophrenia ([place])   ? Substance abuse ([place])   ?  ?Past Surgical History:  ?Procedure Laterality Date  ? NO PAST SURGERIES    ? ?Family History:  ?Family History  ?Problem Relation Age of Onset  ? Schizophrenia Maternal Uncle   ? Diabetes Mellitus II Father   ? ?Family Psychiatric  History: None  reported  ?Social History:  ?Social History  ? ?Substance and Sexual Activity  ?Alcohol Use Yes  ? Alcohol/week: 6.0 - 24.0 standard drinks  ? Types: 6 - 24 Cans of beer per week  ?   ?Social History  ? ?Substance and Sexual Activity  ?Drug Use Yes  ? Types: "Crack" cocaine, Marijuana  ?  ?Social History  ? ?Socioeconomic History  ? Marital status: Single  ?  Spouse name: Not on file  ? Number of children: Not on file  ? Years of education: Not on file  ? Highest education level: Not on file  ?Occupational History  ?  Not on file  ?Tobacco Use  ? Smoking status: Former  ?  Packs/day: 1.00  ?  Years: 10.00  ?  Pack years: 10.00  ?  Types: Cigarettes  ?  Quit date: 01/30/2018  ?  Years since quitting: 3.8  ? Smokeless tobacco: Never  ?Vaping Use  ? Vaping Use: Every day  ?Substance and Sexual Activity  ? Alcohol use: Yes  ?  Alcohol/week: 6.0 - 24.0 standard drinks  ?  Types: 6 - 24 Cans of beer per week  ? Drug use: Yes  ?  Types: "Crack" cocaine, Marijuana  ? Sexual activity: Yes  ?  Birth control/protection: Condom  ?Other Topics Concern  ? Not on file  ?Social History  Narrative  ? Not on file  ? ?Social Determinants of Health  ? ?Financial Resource Strain: Not on file  ?Food Insecurity: Not on file  ?Transportation Needs: Not on file  ?Physical Activity: Not on file  ?Stress: Not on file  ?Social Connections: Not on file  ? ?Sleep: Poor ? ?Appetite:  Good ? ?Current Medications: ?Current Facility-Administered Medications  ?Medication Dose Route Frequency Provider Last Rate Last Admin  ? acetaminophen (TYLENOL) tablet 650 mg  650 mg Oral Q6H PRN Prescilla Sours, PA-C      ? alum & mag hydroxide-simeth (MAALOX/MYLANTA) 200-200-20 MG/5ML suspension 30 mL  30 mL Oral Q4H PRN Prescilla Sours, PA-C      ? feeding supplement (ENSURE ENLIVE / ENSURE PLUS) liquid 237 mL  237 mL Oral BID BM Massengill, Nathan, MD   237 mL at 11/17/21 1000  ? hydrOXYzine (ATARAX) tablet 25 mg  25 mg Oral Q6H PRN Prescilla Sours, PA-C   25 mg at 11/16/21 2116  ? loperamide (IMODIUM) capsule 2-4 mg  2-4 mg Oral PRN Prescilla Sours, PA-C      ? OLANZapine zydis (ZYPREXA) disintegrating tablet 5 mg  5 mg Oral Q8H PRN Prescilla Sours, PA-C      ? And  ? LORazepam (ATIVAN) tablet 1 mg  1 mg Oral PRN Prescilla Sours, PA-C      ? And  ? ziprasidone (GEODON) injection 20 mg  20 mg Intramuscular PRN Prescilla Sours, PA-C      ? LORazepam (ATIVAN) tablet 1 mg  1 mg Oral Q6H PRN Margorie Mansoor W, PA-C      ? magnesium hydroxide (MILK OF MAGNESIA) suspension 30 mL  30 mL Oral Daily PRN Prescilla Sours, PA-C      ? mirtazapine (REMERON) tablet 7.5 mg  7.5 mg Oral QHS Briant Cedar, MD   7.5 mg at 11/16/21 2116  ? multivitamin with minerals tablet 1 tablet  1 tablet Oral Daily Prescilla Sours, PA-C   1 tablet at 11/17/21 [id number]  ? ondansetron (ZOFRAN-ODT) disintegrating tablet 4 mg  4 mg Oral Q6H PRN Prescilla Sours, PA-C      ? [START ON 11/18/2021] sertraline (ZOLOFT) tablet 50 mg  50 mg Oral Daily TRUE Garciamartinez, NP      ? thiamine tablet 100 mg  100 mg Oral Daily Margorie Ulys W, PA-C   100 mg at 11/17/21 0736  ? traZODone  (DESYREL) tablet 100 mg  100 mg Oral QHS Nicholes Rough, NP      ? ? ?Lab Results:  ?Results for orders placed or performed during the hospital encounter of 11/15/21 (from the past 48 hour(s))  ?Hemoglobin A1c     Status: None  ? Collection  Time: 11/16/21  6:36 AM  ?Result Value Ref Range  ? Hgb A1c MFr Bld 5.1 4.8 - 5.6 %  ?  Comment: (NOTE) ?Pre diabetes:          5.7%-6.4% ? ?Diabetes:              >6.4% ? ?Glycemic control for   <7.0% ?adults with diabetes ?  ? Mean Plasma Glucose 99.67 mg/dL  ?  Comment: Performed at [facility], [place] [address] [place], [place] [address]  ?Lipid panel     Status: Abnormal  ? Collection Time: 11/16/21  6:36 AM  ?Result Value Ref Range  ? Cholesterol 200 0 - 200 mg/dL  ? Triglycerides 221 (H) <150 mg/dL  ? HDL 67 >40 mg/dL  ? Total CHOL/HDL Ratio 3.0 RATIO  ? VLDL 44 (H) 0 - 40 mg/dL  ? LDL Cholesterol 89 0 - 99 mg/dL  ?  Comment:        ?Total Cholesterol/HDL:CHD Risk ?Coronary Heart Disease Risk Table ?                    Men   Women ? 1/2 Average Risk   3.4   3.3 ? Average Risk       5.0   4.4 ? 2 X Average Risk   9.6   7.1 ? 3 X Average Risk  23.4   11.0 ?       ?Use the calculated Patient Ratio ?above and the CHD Risk Table ?to determine the patient's CHD Risk. ?       ?ATP III CLASSIFICATION (LDL): ? <100     mg/dL   Optimal ? 100-129  mg/dL   Near or Above ?                   Optimal ? 130-159  mg/dL   Borderline ? 160-189  mg/dL   High ? >190     mg/dL   Very High ?Performed at [facility], [place] [address] [place], [place] [address] ?  ?TSH     Status: None  ? Collection Time: 11/16/21  6:36 AM  ?Result Value Ref Range  ? TSH 1.225 0.350 - 4.500 uIU/mL  ?  Comment: Performed by a 3rd Generation assay with a functional sensitivity of <=0.01 uIU/mL. ?Performed at [facility], [place] [address] [place], [place] [address] ?  ? ? ?Blood Alcohol level:  ?Lab Results  ?Component Value Date  ? ETH 27 (H) 11/14/2021  ? ETH 137 (H)  01/31/2017  ? ? ?Metabolic Disorder Labs: ?Lab Results  ?Component Value Date  ? HGBA1C 5.1 11/16/2021  ? MPG 99.67 11/16/2021  ? MPG 108 10/12/2015  ? ?No results found for: PROLACTIN ?Lab Results  ?Component Valu

## 2021-11-18 LAB — COMPREHENSIVE METABOLIC PANEL
ALT: 35 U/L (ref 0–44)
AST: 31 U/L (ref 15–41)
Albumin: 3.2 g/dL — ABNORMAL LOW (ref 3.5–5.0)
Alkaline Phosphatase: 74 U/L (ref 38–126)
Anion gap: 8 (ref 5–15)
BUN: 10 mg/dL (ref 6–20)
CO2: 22 mmol/L (ref 22–32)
Calcium: 8.6 mg/dL — ABNORMAL LOW (ref 8.9–10.3)
Chloride: 105 mmol/L (ref 98–111)
Creatinine, Ser: 0.65 mg/dL (ref 0.61–1.24)
GFR, Estimated: >60 mL/min (ref >60)
Glucose, Bld: 113 mg/dL — ABNORMAL HIGH (ref 70–99)
Potassium: 4 mmol/L (ref 3.5–5.1)
Sodium: 135 mmol/L (ref 135–145)
Total Bilirubin: 0.3 mg/dL (ref 0.3–1.2)
Total Protein: 6 g/dL — ABNORMAL LOW (ref 6.5–8.1)

## 2021-11-18 MED ORDER — QUETIAPINE FUMARATE 50 MG PO TABS
50.0000 mg | ORAL_TABLET | Freq: Every evening | ORAL | Status: DC | PRN
  Administered 2021-11-18: 50 mg via ORAL
  Filled 2021-11-18: qty 1

## 2021-11-18 NOTE — Progress Notes (Signed)
?   11/18/21 0500  ?Sleep  ?Number of Hours 6.75  ? ? ?

## 2021-11-18 NOTE — Progress Notes (Signed)
[facility] ? ?11/18/2021 11:00 AM ?Brian Nolan  ?MRN:  [id number] ?Subjective:   Brian Nolan is a 41 yr old male who [facility] and worsening depression in the setting of substance use, [facility]  PPHx is significant for Substance Induced Mood Disorder, Polysubtance use (Crack-cocaine, EtOH, THC) and [facility] ? ?Case was discussed in the multidisciplinary team. MAR was reviewed and patient was compliant with medications.  He did not require any PRN's for agitation. RN reports that although patient is overall, very straightforward and only has minimal interaction with staff, she is concerned for underlying irritability  in patient.  ?  ?  ?Psychiatric Team made the following recommendations yesterday:  ?-Increase Zoloft 50 mg daily  ?-Continue Remeron 7.5mg  qhs ?  ?On assessment today patient reports that his mood is "ok." Patient reports that he did not sleep well last night. Patient reports that he feels that his mind was racing and that he is worried that this may be 2/2 to the medication he takes at night. Patient reports that he feels his sleep is only getting worse. Patient reports that he also feels that his appetite is declining since hospitalization. Patient reports that he does not wish to continue either the Remeron or Trazodone. Patient reports that despite this he has noticed a change in his overall mood. Patient reports that he has ben attending groups and has been overall enjoying them. Patient reports that he is a  bit worried that he is becoming more irritable. Patient reports that he feels that he is suddenly more bothered by certain things, such as when a patient appeared to derail one of the groups. Patient reports that he does not have outburst, but is more irritated internally. Patient reports that he is not sure if this "is me just coming back to reality" without substances in his system, or  if he is truly more irritable. Patient denies HI, SI and AVH. Patient endorses that he is still very much [facility] with [facility] substance use. Patient recalls having a 3 yr sobriety the last time he went to their facility.  ?Principal Problem: Major depressive disorder, recurrent episode, severe (HCC) ?Diagnosis: Principal Problem: ?  Major depressive disorder, recurrent episode, severe (HCC) ?Active Problems: ?  Substance induced mood disorder (HCC) ? ?Total Time spent with patient: 30 minutes ? ?Past Psychiatric History:  See H&P ? ?Past Medical History:  ?Past Medical History:  ?Diagnosis Date  ? Depression   ? Medical history non-contributory   ? Schizophrenia (HCC)   ? Substance abuse (HCC)   ?  ?Past Surgical History:  ?Procedure Laterality Date  ? NO PAST SURGERIES    ? ?Family History:  ?Family History  ?Problem Relation Age of Onset  ? Schizophrenia Maternal Uncle   ? Diabetes Mellitus II Father   ? ?Family Psychiatric  History:  See H&P ?Social History:  ?Social History  ? ?Substance and Sexual Activity  ?Alcohol Use Yes  ? Alcohol/week: 6.0 - 24.0 standard drinks  ? Types: 6 - 24 Cans of beer per week  ?   ?Social History  ? ?Substance and Sexual Activity  ?Drug Use Yes  ? Types: "Crack" cocaine, Marijuana  ?  ?Social History  ? ?Socioeconomic History  ? Marital status: Single  ?  Spouse name: Not on file  ? Number of children: Not on file  ?  Years of education: Not on file  ? Highest education level: Not on file  ?Occupational History  ? Not on file  ?Tobacco Use  ? Smoking status: Former  ?  Packs/day: 1.00  ?  Years: 10.00  ?  Pack years: 10.00  ?  Types: Cigarettes  ?  Quit date: 01/30/2018  ?  Years since quitting: 3.8  ? Smokeless tobacco: Never  ?Vaping Use  ? Vaping Use: Every day  ?Substance and Sexual Activity  ? Alcohol use: Yes  ?  Alcohol/week: 6.0 - 24.0 standard drinks  ?  Types: 6 - 24 Cans of beer per week  ? Drug use: Yes  ?  Types: "Crack" cocaine,  Marijuana  ? Sexual activity: Yes  ?  Birth control/protection: Condom  ?Other Topics Concern  ? Not on file  ?Social History Narrative  ? Not on file  ? ?Social Determinants of Health  ? ?Financial Resource Strain: Not on file  ?Food Insecurity: Not on file  ?Transportation Needs: Not on file  ?Physical Activity: Not on file  ?Stress: Not on file  ?Social Connections: Not on file  ? ?Additional Social History:  ?  ?  ?  ?  ?  ?  ?  ?  ?  ?  ?  ? ?Sleep: Poor ? ?Appetite:  Poor ? ?Current Medications: ?Current Facility-Administered Medications  ?Medication Dose Route Frequency Provider Last Rate Last Admin  ? acetaminophen (TYLENOL) tablet 650 mg  650 mg Oral Q6H PRN Jaclyn Shaggy, PA-C      ? alum & mag hydroxide-simeth (MAALOX/MYLANTA) 200-200-20 MG/5ML suspension 30 mL  30 mL Oral Q4H PRN Jaclyn Shaggy, PA-C      ? feeding supplement (ENSURE ENLIVE / ENSURE PLUS) liquid 237 mL  237 mL Oral BID BM Massengill, Harrold Donath, MD   237 mL at 11/17/21 1500  ? OLANZapine zydis (ZYPREXA) disintegrating tablet 5 mg  5 mg Oral Q8H PRN Jaclyn Shaggy, PA-C      ? And  ? LORazepam (ATIVAN) tablet 1 mg  1 mg Oral PRN Jaclyn Shaggy, PA-C      ? And  ? ziprasidone (GEODON) injection 20 mg  20 mg Intramuscular PRN Melbourne Abts W, PA-C      ? magnesium hydroxide (MILK OF MAGNESIA) suspension 30 mL  30 mL Oral Daily PRN Jaclyn Shaggy, PA-C      ? multivitamin with minerals tablet 1 tablet  1 tablet Oral Daily Jaclyn Shaggy, PA-C   1 tablet at 11/18/21 9417  ? QUEtiapine (SEROQUEL) tablet 50 mg  50 mg Oral QHS PRN Eliseo Gum B, MD      ? sertraline (ZOLOFT) tablet 50 mg  50 mg Oral Daily Starleen Blue, NP   50 mg at 11/18/21 4081  ? thiamine tablet 100 mg  100 mg Oral Daily Jaclyn Shaggy, PA-C   100 mg at 11/18/21 4481  ? traZODone (DESYREL) tablet 100 mg  100 mg Oral QHS Starleen Blue, NP   100 mg at 11/17/21 2118  ? ? ?Lab Results:  ?Results for orders placed or performed during the hospital encounter of 11/15/21 (from the  past 48 hour(s))  ?Comprehensive metabolic panel     Status: Abnormal  ? Collection Time: 11/18/21  6:27 AM  ?Result Value Ref Range  ? Sodium 135 135 - 145 mmol/L  ? Potassium 4.0 3.5 - 5.1 mmol/L  ? Chloride 105 98 - 111 mmol/L  ? CO2 22 22 -  32 mmol/L  ? Glucose, Bld 113 (H) 70 - 99 mg/dL  ?  Comment: Glucose reference range applies only to samples taken after fasting for at least 8 hours.  ? BUN 10 6 - 20 mg/dL  ? Creatinine, Ser 0.65 0.61 - 1.24 mg/dL  ? Calcium 8.6 (L) 8.9 - 10.3 mg/dL  ? Total Protein 6.0 (L) 6.5 - 8.1 g/dL  ? Albumin 3.2 (L) 3.5 - 5.0 g/dL  ? AST 31 15 - 41 U/L  ? ALT 35 0 - 44 U/L  ? Alkaline Phosphatase 74 38 - 126 U/L  ? Total Bilirubin 0.3 0.3 - 1.2 mg/dL  ? GFR, Estimated >60 >60 mL/min  ?  Comment: (NOTE) ?Calculated using the CKD-EPI Creatinine Equation (2021) ?  ? Anion gap 8 5 - 15  ?  Comment: Performed at [facility], [address], [place], [place] [address]  ? ? ?Blood Alcohol level:  ?Lab Results  ?Component Value Date  ? ETH 27 (H) 11/14/2021  ? ETH 137 (H) 01/31/2017  ? ? ?Metabolic Disorder Labs: ?Lab Results  ?Component Value Date  ? HGBA1C 5.1 11/16/2021  ? MPG 99.67 11/16/2021  ? MPG 108 10/12/2015  ? ?No results found for: PROLACTIN ?Lab Results  ?Component Value Date  ? CHOL 200 11/16/2021  ? TRIG 221 (H) 11/16/2021  ? HDL 67 11/16/2021  ? CHOLHDL 3.0 11/16/2021  ? VLDL 44 (H) 11/16/2021  ? LDLCALC 89 11/16/2021  ? LDLCALC UNABLE TO CALCULATE IF TRIGLYCERIDE OVER 400 mg/dL 19/41/7408  ? ? ?Physical Findings: ?AIMS: Facial and Oral Movements ?Muscles of Facial Expression: None, normal ?Lips and Perioral Area: None, normal ?Jaw: None, normal ?Tongue: None, normal,Extremity Movements ?Upper (arms, wrists, hands, fingers): None, normal ?Lower (legs, knees, ankles, toes): None, normal, Trunk Movements ?Neck, shoulders, hips: None, normal, Overall Severity ?Severity of abnormal movements (highest score from questions above): None, normal ?Incapacitation  due to abnormal movements: None, normal ?Patient's awareness of abnormal movements (rate only patient's report): No Awareness, Dental Status ?Current problems with teeth and/or dentures?: No ?Does patient usua

## 2021-11-18 NOTE — Progress Notes (Signed)
Patient denies SI HI and AVH this shift. Patient has been in room resting and has had no incidents of behavioral dyscontrol.  ? ?Assess patient for safety, offer medicaitons as prescribed, engage patient in 1:1 staff talks.  ? ?Patient able to contract for safety, continue to monitor as planned.  ?

## 2021-11-18 NOTE — [facility] Group Notes (Signed)
Spiritual care group on grief and loss facilitated by chaplains Amy Burris, MDiv and Katy Claussen, BCC  ? ?Group Goal:  ?Support / Education around grief and loss  ? ?Members engage in facilitated group support and psycho-social education.  ? ?Group Description:  ?Spiritual care group on grief and loss facilitated by chaplain Katy Claussen, BCC  ? ?Group Goal:  ? ?Support / Education around grief and loss  ? ?Members engage in facilitated group support and psycho-social education.  ? ?Group Description:  ? ?Following introductions and group rules, group members engaged in facilitated group dialog and support around topic of loss, with particular support around experiences of loss in their lives. Group Identified types of loss (relationships / self / things) and identified patterns, circumstances, and changes that precipitate losses. Reflected on thoughts / feelings around loss, normalized grief responses, and recognized variety in grief experience. Group noted Worden's four tasks of grief in discussion.  ? ?Group drew on Adlerian / Rogerian, narrative, MI,  ? ?Patient Progress: Pt did not attend group. ?

## 2021-11-18 NOTE — Group Note (Unsigned)
LCSW Group Therapy Note ? ? ?Group Date: 11/18/2021 ?Start Time: 1300 ?End Time: 1400 ? ? ?Type of Therapy and Topic:  Group Therapy:  ? ?Participation Level:  {[facility] PARTICIPATION LEVEL:22264} ? ?Description of Group: ? ? ?Therapeutic Goals: ? ?1.   ? ? ?Summary of Patient Progress:   ? ?*** ? ?Therapeutic Modalities:  ? ?Sophina Mitten M Gesenia Bantz, LCSWA ?11/18/2021  2:07 PM   ? ?

## 2021-11-18 NOTE — [facility] Group Notes (Signed)
PT attended AA, Was appropriate and attentive.  ?

## 2021-11-18 NOTE — Group Note (Signed)
Recreation Therapy Group Note ? ? ?Group Topic:Stress Management  ?Group Date: 11/18/2021 ?Start Time: 0930 ?End Time: 0955 ?Facilitators: Caroll Rancher, LRT,CTRS ?Location: [address] Day[address] ? ? ?Goal Area(s) Addresses:  ?Patient will identify positive stress management techniques. ?Patient will identify benefits of using stress management post d/c. ? ?Group Description:  Meditation.  LRT played a meditation that focused on finding your rhythm through breathing to help calm stress.  Patients were to let whatever rhythm of breathing that was comfortable to them, calm and relax them.  Patients were to listen along as meditation played to fully engage in activity. ? ? ?Affect/Mood: N/A ?  ?Participation Level: Did not attend ?  ? ?Clinical Observations/Individualized Feedback:   ? ? ?Plan: Continue to engage patient in RT group sessions 2-3x/week. ? ? ?Caroll Rancher, LRT,CTRS ?11/18/2021 12:19 PM ?

## 2021-11-18 NOTE — [facility] Group Notes (Signed)
?  Group Date: 11/18/2021 ?Start Time: 1300 ?End Time: 1400 ?  ?  ?Type of Therapy and Topic:  Group Therapy:  Positive Affirmations ?  ?Participation Level:  Active ?  ?Description of Group: ?This group addressed positive affirmation toward self and others. Patients went around the room and identified two positive things about themselves and two positive things about a peer in the room. Patients reflected on how it felt to share something positive with others, to identify positive things about themselves, and to hear positive things from others. Patients were encouraged to have a daily reflection of positive characteristics or circumstances. ?Therapeutic Goals ?Patient will verbalize two of their positive qualities ?Patient will demonstrate empathy for others by stating two positive qualities about a peer in the group ?Patient will verbalize their feelings when voicing positive self affirmations and when voicing positive affirmations of others ?Patients will discuss the potential positive impact on their wellness/recovery of focusing on positive traits of self and others. ?  ?  ?Summary of Patient Progress:  Due to the spread of Illness on the 300 and 400 halls group was not held in the dayroom.  CSW met with each patient individually as needed with PPE precautions in place to reduce the spread of any further Illness.  CSW provided the patient's with worksheet packets and instructions on how to complete those packets.  CSW will follow up with patient's as needed.  ?  ?  ?Therapeutic Modalities ?Cognitive Behavioral Therapy ?Motivational Interviewing ?  ?Hiram Mciver M Oluwatomiwa Kinyon, LCSWA ?11/18/2021  1:59 PM   ?

## 2021-11-19 DIAGNOSIS — F332 Major depressive disorder, recurrent severe without psychotic features: Principal | ICD-10-CM

## 2021-11-19 MED ORDER — TRAZODONE HCL 100 MG PO TABS: 100.0000 mg | ORAL_TABLET | Freq: Every day | ORAL | 0 refills | Status: DC

## 2021-11-19 MED ORDER — SERTRALINE HCL 50 MG PO TABS: 50.0000 mg | ORAL_TABLET | Freq: Every day | ORAL | 0 refills | Status: DC

## 2021-11-19 MED ORDER — QUETIAPINE FUMARATE 50 MG PO TABS
50.0000 mg | ORAL_TABLET | Freq: Every day | ORAL | Status: DC
Start: 2021-11-19 — End: 2021-11-20
  Administered 2021-11-19: 50 mg via ORAL
  Filled 2021-11-19: qty 1
  Filled 2021-11-19: qty 14

## 2021-11-19 MED ORDER — QUETIAPINE FUMARATE 50 MG PO TABS: 50.0000 mg | ORAL_TABLET | Freq: Every day | ORAL | 0 refills | Status: DC

## 2021-11-19 NOTE — Progress Notes (Signed)
Pt denies SI/HI/AVH and verbally agrees to approach staff if these become apparent or before harming themselves/others. Rates depression 0/10. Rates anxiety 0/10. Rates pain 0/10. Pt has been in a better mood today. Pt has been out going to groups and in the dayroom for the majority of the day. Scheduled medications administered to pt, per MD orders. RN provided support and encouragement to pt. Q15 min safety checks implemented and continued. Pt safe on the unit. RN will continue to monitor and intervene as needed.  ? 11/19/21 0820  ?Psych Admission Type (Psych Patients Only)  ?Admission Status Voluntary  ?Psychosocial Assessment  ?Patient Complaints None  ?Eye Contact Fair  ?Facial Expression Other (Comment) ?(WDL)  ?Affect Appropriate to circumstance  ?Speech Logical/coherent  ?Interaction Assertive  ?Motor Activity Other (Comment) ?(WDL)  ?Appearance/Hygiene Unremarkable  ?Behavior Characteristics Cooperative;Appropriate to situation;Calm  ?Mood Pleasant  ?Thought Process  ?Coherency WDL  ?Content WDL  ?Delusions None reported or observed  ?Perception WDL  ?Hallucination None reported or observed  ?Judgment WDL  ?Confusion None  ?Danger to Self  ?Current suicidal ideation? Denies  ?Danger to Others  ?Danger to Others None reported or observed  ? ? ?

## 2021-11-19 NOTE — Group Note (Signed)
Recreation Therapy Group Note ? ? ?Group Topic:Animal Assisted Therapy   ?Group Date: 11/19/2021 ?Start Time: 1430 ?End Time: 1515 ?Facilitators: Caroll Rancher, LRT,CTRS ?Location: 400 Hall Dayroom ? ? ?Animal-Assisted Activity (AAA) Program Checklist/Progress Note ?Patient Eligibility Criteria Checklist & Daily Group note for Rec Tx Intervention ? ?AAA/T Program Assumption of Risk Form signed by Patient/ or Parent Legal Guardian YES ? ?Patient understands their participation is voluntary YES ? ? ?Group Description: Patients provided opportunity to interact with trained and credentialed [employer] Therapy dog and the community volunteer/dog handler. Patients practiced appropriate animal interaction and were educated on dog safety outside of the hospital in common community settings. Patients were allowed to use dog toys and other items to practice commands, engage the dog in play, and/or complete routine aspects of animal care.  ? ?Education: Charity fundraiser, Health visitor, Communication & Social Skills  ? ? ?Affect/Mood: N/A ?  ?Participation Level: Did not attend ?  ? ?Clinical Observations/Individualized Feedback:   ? ? ?Plan: Continue to engage patient in RT group sessions 2-3x/week. ? ? ?Caroll Rancher, LRT,CTRS ?11/19/2021 3:54 PM ?

## 2021-11-19 NOTE — Progress Notes (Signed)
Adult Psychoeducational Group Note ? ?Date:  11/19/2021 ?Time:  8:52 PM ? ?Group Topic/Focus:  ?Wrap-Up Group:   The focus of this group is to help patients review their daily goal of treatment and discuss progress on daily workbooks. ? ?Participation Level:  Active ? ?Participation Quality:  Appropriate ? ?Affect:  Appropriate ? ?Cognitive:  Appropriate ? ?Insight: Appropriate ? ?Engagement in Group:  Engaged ? ?Modes of Intervention:  Discussion ? ?Additional Comments:  patient said his day was 10.Goal for today Day mark. He achieved his goal coping skills patience. ? ?Lenice Llamas Long ?11/19/2021, 8:52 PM ?

## 2021-11-19 NOTE — Plan of Care (Signed)
  Problem: Education: Goal: Emotional status will improve Outcome: Progressing Goal: Mental status will improve Outcome: Progressing   Problem: Activity: Goal: Interest or engagement in activities will improve Outcome: Progressing Goal: Sleeping patterns will improve Outcome: Progressing   

## 2021-11-19 NOTE — Progress Notes (Signed)
?  On assessment, pt presents with mild anxiety.  Pt reports he is excited about moving forward and knows where he dropped off in life. Pt reports everything went downhill from that point.  Pt reports that he missed his son's first day at baseball practice.  Pt denies SI/HI and verbally contracts for safety.  Pt denies SI/HI and verbally contracts for safety.  Pt denies AVH. ? ?Vitals obtained.  Administered PRN Seroquel for Insomnia per [facility] per pt request. ? ?Pt is safe on the unit with Q 15 minute safety checks. ? ? ? 11/18/21 2126  ?Psych Admission Type (Psych Patients Only)  ?Admission Status Voluntary  ?Psychosocial Assessment  ?Patient Complaints Anxiety  ?Eye Contact Fair  ?Facial Expression Anxious  ?Affect Appropriate to circumstance  ?Speech Logical/coherent  ?Interaction Cautious  ?Motor Activity Slow  ?Appearance/Hygiene Unremarkable  ?Behavior Characteristics Cooperative  ?Mood Pleasant;Anxious  ?Thought Process  ?Coherency WDL  ?Content Blaming self  ?Delusions WDL  ?Perception WDL  ?Hallucination None reported or observed  ?Judgment WDL  ?Confusion WDL  ?Danger to Self  ?Current suicidal ideation? Denies  ?Self-Injurious Behavior No self-injurious ideation or behavior indicators observed or expressed   ?Agreement Not to Harm Self Yes  ?Description of Agreement Verbal contract for safety  ?Danger to Others  ?Danger to Others None reported or observed  ? ? ?

## 2021-11-19 NOTE — Progress Notes (Addendum)
[facility] MD Progress Note ? ?11/19/2021 12:38 PM ?Brian Nolan  ?MRN:  [id number] ?Subjective:   Brian Nolan is a 41 yr old male who presented on 3/16 to [facility] with SI and worsening depression in the setting of substance use, he was admitted to [facility] on 3/17.  PPHx is significant for Substance Induced Mood Disorder, Polysubtance use (Crack-cocaine, EtOH, THC) and multiple hospitalizations (latest [facility] 2017). ?  ?Case was discussed in the multidisciplinary team. MAR was reviewed and patient was compliant with medications.  He did not require any PRN's for agitation.  ? ?Psychiatric Team made the following recommendations yesterday:  ?-Continue Zoloft 50 mg daily  ?-Discontinue Remeron 7.5mg  qhs ?- Start Seroquel 50mg  QHS PRN ?  ?On assessment this AM patient reports that he slept very well last night. Patient did receive Trazodone 100mg  as it was still ordered, and he received Seroquel 50mg  QHS PRN. Patient reports that he feels better today. Patient reports that he believes that his "irritability" was part of him becoming sober and he is less concerned about this. Patient reports that he has found groups continue to be of benefit to him. Patient reports that he he continues to want to move on to a rehab for his journey for sobriety. Patient repots that he also feels that his appetite is coming back. Patient denies SI, HI, and AVH.  ?Principal Problem: Major depressive disorder, recurrent episode, severe (HCC) ?Diagnosis: Principal Problem: ?  Major depressive disorder, recurrent episode, severe (HCC) ?Active Problems: ?  Substance induced mood disorder (HCC) ? ?Total Time spent with patient: 20 minutes ? ?Past Psychiatric History: See H&P ? ?Past Medical History:  ?Past Medical History:  ?Diagnosis Date  ? Depression   ? Medical history non-contributory   ? Schizophrenia (HCC)   ? Substance abuse (HCC)   ?  ?Past Surgical History:  ?Procedure Laterality Date  ? NO PAST SURGERIES    ? ?Family History:  ?Family  History  ?Problem Relation Age of Onset  ? Schizophrenia Maternal Uncle   ? Diabetes Mellitus II Father   ? ?Family Psychiatric  History:  See H&P ?Social History:  ?Social History  ? ?Substance and Sexual Activity  ?Alcohol Use Yes  ? Alcohol/week: 6.0 - 24.0 standard drinks  ? Types: 6 - 24 Cans of beer per week  ?   ?Social History  ? ?Substance and Sexual Activity  ?Drug Use Yes  ? Types: "Crack" cocaine, Marijuana  ?  ?Social History  ? ?Socioeconomic History  ? Marital status: Single  ?  Spouse name: Not on file  ? Number of children: Not on file  ? Years of education: Not on file  ? Highest education level: Not on file  ?Occupational History  ? Not on file  ?Tobacco Use  ? Smoking status: Former  ?  Packs/day: 1.00  ?  Years: 10.00  ?  Pack years: 10.00  ?  Types: Cigarettes  ?  Quit date: 01/30/2018  ?  Years since quitting: 3.8  ? Smokeless tobacco: Never  ?Vaping Use  ? Vaping Use: Every day  ?Substance and Sexual Activity  ? Alcohol use: Yes  ?  Alcohol/week: 6.0 - 24.0 standard drinks  ?  Types: 6 - 24 Cans of beer per week  ? Drug use: Yes  ?  Types: "Crack" cocaine, Marijuana  ? Sexual activity: Yes  ?  Birth control/protection: Condom  ?Other Topics Concern  ? Not on file  ?Social History Narrative  ?  Not on file  ? ?Social Determinants of Health  ? ?Financial Resource Strain: Not on file  ?Food Insecurity: Not on file  ?Transportation Needs: Not on file  ?Physical Activity: Not on file  ?Stress: Not on file  ?Social Connections: Not on file  ? ?Additional Social History:  ?  ?  ?  ?  ?  ?  ?  ?  ?  ?  ?  ? ?Sleep: Good ? ?Appetite:  Good ? ?Current Medications: ?Current Facility-Administered Medications  ?Medication Dose Route Frequency Provider Last Rate Last Admin  ? acetaminophen (TYLENOL) tablet 650 mg  650 mg Oral Q6H PRN Jaclyn Shaggyaylor, Cody W, PA-C      ? alum & mag hydroxide-simeth (MAALOX/MYLANTA) 200-200-20 MG/5ML suspension 30 mL  30 mL Oral Q4H PRN Jaclyn Shaggyaylor, Cody W, PA-C      ? feeding supplement  (ENSURE ENLIVE / ENSURE PLUS) liquid 237 mL  237 mL Oral BID BM Phineas InchesMassengill, Nathan, MD   237 mL at 11/19/21 [id number]  ? OLANZapine zydis (ZYPREXA) disintegrating tablet 5 mg  5 mg Oral Q8H PRN Jaclyn Shaggyaylor, Cody W, PA-C      ? And  ? LORazepam (ATIVAN) tablet 1 mg  1 mg Oral PRN Jaclyn Shaggyaylor, Cody W, PA-C      ? And  ? ziprasidone (GEODON) injection 20 mg  20 mg Intramuscular PRN Melbourne Abtsaylor, Cody W, PA-C      ? magnesium hydroxide (MILK OF MAGNESIA) suspension 30 mL  30 mL Oral Daily PRN Jaclyn Shaggyaylor, Cody W, PA-C      ? multivitamin with minerals tablet 1 tablet  1 tablet Oral Daily Jaclyn Shaggyaylor, Cody W, PA-C   1 tablet at 11/19/21 0820  ? QUEtiapine (SEROQUEL) tablet 50 mg  50 mg Oral QHS Eliseo GumMcQuilla, Jonaven Hilgers B, MD      ? sertraline (ZOLOFT) tablet 50 mg  50 mg Oral Daily Starleen BlueNkwenti, Doris, NP   50 mg at 11/19/21 [id number]  ? thiamine tablet 100 mg  100 mg Oral Daily Melbourne Abtsaylor, Cody W, PA-C   100 mg at 11/19/21 0820  ? traZODone (DESYREL) tablet 100 mg  100 mg Oral QHS Starleen BlueNkwenti, Doris, NP   100 mg at 11/18/21 2126  ? ? ?Lab Results:  ?Results for orders placed or performed during the hospital encounter of 11/15/21 (from the past 48 hour(s))  ?Comprehensive metabolic panel     Status: Abnormal  ? Collection Time: 11/18/21  6:27 AM  ?Result Value Ref Range  ? Sodium 135 135 - 145 mmol/L  ? Potassium 4.0 3.5 - 5.1 mmol/L  ? Chloride 105 98 - 111 mmol/L  ? CO2 22 22 - 32 mmol/L  ? Glucose, Bld 113 (H) 70 - 99 mg/dL  ?  Comment: Glucose reference range applies only to samples taken after fasting for at least 8 hours.  ? BUN 10 6 - 20 mg/dL  ? Creatinine, Ser 0.65 0.61 - 1.24 mg/dL  ? Calcium 8.6 (L) 8.9 - 10.3 mg/dL  ? Total Protein 6.0 (L) 6.5 - 8.1 g/dL  ? Albumin 3.2 (L) 3.5 - 5.0 g/dL  ? AST 31 15 - 41 U/L  ? ALT 35 0 - 44 U/L  ? Alkaline Phosphatase 74 38 - 126 U/L  ? Total Bilirubin 0.3 0.3 - 1.2 mg/dL  ? GFR, Estimated >60 >60 mL/min  ?  Comment: (NOTE) ?Calculated using the CKD-EPI Creatinine Equation (2021) ?  ? Anion gap 8 5 - 15  ?  Comment: Performed at  [facility], [address], [place], [place] [address]  ? ? ?Blood Alcohol level:  ?Lab Results  ?Component Value Date  ? ETH 27 (H) 11/14/2021  ? ETH 137 (H) 01/31/2017  ? ? ?Metabolic Disorder Labs: ?Lab Results  ?Component Value Date  ? HGBA1C 5.1 11/16/2021  ? MPG 99.67 11/16/2021  ? MPG 108 10/12/2015  ? ?No results found for: PROLACTIN ?Lab Results  ?Component Value Date  ? CHOL 200 11/16/2021  ? TRIG 221 (H) 11/16/2021  ? HDL 67 11/16/2021  ? CHOLHDL 3.0 11/16/2021  ? VLDL 44 (H) 11/16/2021  ? LDLCALC 89 11/16/2021  ? LDLCALC UNABLE TO CALCULATE IF TRIGLYCERIDE OVER 400 mg/dL 35/57/3220  ? ? ?Physical Findings: ?AIMS: Facial and Oral Movements ?Muscles of Facial Expression: None, normal ?Lips and Perioral Area: None, normal ?Jaw: None, normal ?Tongue: None, normal,Extremity Movements ?Upper (arms, wrists, hands, fingers): None, normal ?Lower (legs, knees, ankles, toes): None, normal, Trunk Movements ?Neck, shoulders, hips: None, normal, Overall Severity ?Severity of abnormal movements (highest score from questions above): None, normal ?Incapacitation due to abnormal movements: None, normal ?Patient's awareness of abnormal movements (rate only patient's report): No Awareness, Dental Status ?Current problems with teeth and/or dentures?: No ?Does patient usually wear dentures?: No  ?CIWA:  CIWA-Ar Total: 0 ?COWS:    ? ?Musculoskeletal: ?Strength & Muscle Tone: within normal limits ?Gait & Station: normal ?Patient leans: N/A ? ?Psychiatric Specialty Exam: ? ?Presentation  ?General Appearance: Appropriate for Environment; Casual ? ?Eye Contact:Fair ? ?Speech:Clear and Coherent ? ?Speech Volume:Normal ? ?Handedness:Right ? ? ?Mood and Affect  ?Mood:-- ("ok" describes some possible irritabilty) ? ?Affect:Appropriate ? ? ?Thought Process  ?Thought Processes:Goal Directed ? ?Descriptions of Associations:Circumstantial ? ?Orientation:Full (Time, Place and Person) ? ?Thought  Content:Logical ? ?History of Schizophrenia/Schizoaffective disorder:No ? ?Duration of Psychotic Symptoms:N/A ? ?Hallucinations:Hallucinations: None ? ?Ideas of Reference:None ? ?Suicidal Thoughts:Suicidal Thoughts: No ? ?Homicidal T

## 2021-11-19 NOTE — Hospital Course (Signed)
During the patient's hospitalization, patient had extensive initial psychiatric evaluation, and follow-up psychiatric evaluations every day. ? ?Psychiatric diagnoses provided upon initial assessment:  ?MDD, Recurrent, Severe, W/O Psychosis ?Stimulant Use Disorder-Cocaine ?EtOH Withdrawal ?THC Use ? ?Patient's psychiatric medications were adjusted on admission: -Start Remeron 7.5 mg QHS ?-Start Zoloft 25 mg  ?- Start Trazodone 50mg  QHS PRN ? ?During the hospitalization, other adjustments were made to the patient's psychiatric medication regimen:  ?- Remeron 7.5mg  , discontinued due to racing thoughts at night and worsened insomnia ?- Zoloft Increased to 50mg  ?- Trazodone increased to 100mg  and scheduled to QHS ?- Started Seroquel 50mg , for reported  worsened insomnia and low concern for irritability  ? ? ?Gradually, patient started adjusting to milieu.   ?Patient's care was discussed during the interdisciplinary team meeting every day during the hospitalization. ? ?The patient denied having side effects to prescribed psychiatric medications at discharge. Patient did endorse having more rapid thoughts and worse insomnia on Remeron 7.5mg  QHS.  ? ?The patient reports their target psychiatric symptoms of depressed mood and cravings for substances responded well to the psychiatric medications, and the patient reports overall benefit other psychiatric hospitalization. Supportive psychotherapy was provided to the patient. The patient also participated in regular group therapy while admitted.  ? ?Labs were reviewed with the patient, and abnormal results were discussed with the patient. ? ?The patient denied having suicidal thoughts more than 48 hours prior to discharge.  Patient denies having homicidal thoughts.  Patient denies having auditory hallucinations.  Patient denies any visual hallucinations.  Patient denies having paranoid thoughts. ? ?The patient is able to verbalize their individual safety plan to this  provider. ? ?It is recommended to the patient to continue psychiatric medications as prescribed, after discharge from the hospital.   ? ?It is recommended to the patient to follow up with your outpatient psychiatric provider and PCP. ? ?Discussed with the patient, the impact of alcohol, drugs, tobacco have been there overall psychiatric and medical wellbeing, and total abstinence from substance use was recommended the patient. Patient remained interested throughout his hospitalization about transitioning to a rehab facility. Patient was transferred to [facility] for rehab at discharge.  ?

## 2021-11-19 NOTE — BHH Suicide Risk Assessment (Addendum)
Suicide Risk Assessment ? ?Discharge Assessment    ?[facility] Discharge Suicide Risk Assessment ? ? ?Principal Problem: Major depressive disorder, recurrent episode, severe ([place]) ?Discharge Diagnoses: Principal Problem: ?  Major depressive disorder, recurrent episode, severe ([place]) ?Active Problems: ?  Substance induced mood disorder ([place]) ? ? ?Total Time spent with patient: 20 minutes ? ?Brian Nolan is a 41 yr old male who presented on 3/16 to [facility] with SI and worsening depression in the setting of substance use, he was admitted to [facility] on 3/17.  PPHx is significant for Substance Induced Mood Disorder, Polysubtance use (Crack-cocaine, EtOH, THC) and multiple hospitalizations (latest [facility] 2017). ? ?During the patient's hospitalization, patient had extensive initial psychiatric evaluation, and follow-up psychiatric evaluations every day. ? ?Psychiatric diagnoses provided upon initial assessment:  ?MDD, Recurrent, Severe, W/O Psychosis ?Stimulant Use Disorder-Cocaine ?EtOH Withdrawal ?THC Use ? ?Patient's psychiatric medications were adjusted on admission: -Start Remeron 7.5 mg QHS ?-Start Zoloft 25 mg  ?- Start Trazodone 50mg  QHS PRN ? ?During the hospitalization, other adjustments were made to the patient's psychiatric medication regimen:  ?- Remeron 7.5mg  , discontinued due to racing thoughts at night and worsened insomnia ?- Zoloft Increased to 50mg  ?- Trazodone increased to 100mg  and scheduled to QHS ?- Started Seroquel 50mg , for reported  worsened insomnia and low concern for irritability  ? ? ?Gradually, patient started adjusting to milieu.   ?Patient's care was discussed during the interdisciplinary team meeting every day during the hospitalization. ? ?The patient denied having side effects to prescribed psychiatric medications at discharge. Patient did endorse having more rapid thoughts and worse insomnia on Remeron 7.5mg  QHS.  ? ?The patient reports their target psychiatric symptoms of depressed mood and cravings  for substances responded well to the psychiatric medications, and the patient reports overall benefit other psychiatric hospitalization. Supportive psychotherapy was provided to the patient. The patient also participated in regular group therapy while admitted.  ? ?Labs were reviewed with the patient, and abnormal results were discussed with the patient. ? ?The patient denied having suicidal thoughts more than 48 hours prior to discharge.  Patient denies having homicidal thoughts.  Patient denies having auditory hallucinations.  Patient denies any visual hallucinations.  Patient denies having paranoid thoughts. ? ?The patient is able to verbalize their individual safety plan to this provider. ? ?It is recommended to the patient to continue psychiatric medications as prescribed, after discharge from the hospital.   ? ?It is recommended to the patient to follow up with your outpatient psychiatric provider and PCP. ? ?Discussed with the patient, the impact of alcohol, drugs, tobacco have been there overall psychiatric and medical wellbeing, and total abstinence from substance use was recommended the patient. Patient remained interested throughout his hospitalization about transitioning to a rehab facility. Patient was transferred to [facility] for rehab at discharge.  ? ?Musculoskeletal: ?Strength & Muscle Tone: within normal limits ?Gait & Station: normal ?Patient leans: N/A ? ?Psychiatric Specialty Exam ? ?Presentation  ?General Appearance: Appropriate for Environment; Casual ? ?Eye Contact:Fair ? ?Speech:Clear and Coherent ? ?Speech Volume:Normal ? ?Handedness:Right ? ? ?Mood and Affect  ?Mood:-- ("ok" describes some possible irritabilty) ? ?Duration of Depression Symptoms: Greater than two weeks ? ?Affect:Appropriate ? ? ?Thought Process  ?Thought Processes:Goal Directed ? ?Descriptions of Associations:Circumstantial ? ?Orientation:Full (Time, Place and Person) ? ?Thought Content:Logical ? ?History of  Schizophrenia/Schizoaffective disorder:No ? ?Duration of Psychotic Symptoms:N/A ? ?Hallucinations:Hallucinations: None ? ?Ideas of Reference:None ? ?Suicidal Thoughts:Suicidal Thoughts: No ? ?Homicidal Thoughts:Homicidal Thoughts: No ? ? ?Sensorium  ?  Memory:Immediate Good; Remote Good ? ?Judgment:Fair ? ?Insight:Fair ? ? ?Executive Functions  ?Concentration:Fair ? ?Attention Span:Fair ? ?Recall:Fair ? ?Fund of [place] ? ?Language:Good ? ? ?Psychomotor Activity  ?Psychomotor Activity:Psychomotor Activity: Normal ? ? ?Assets  ?Assets:Communication Skills; Resilience; Desire for Improvement ? ? ?Sleep  ?Sleep:Sleep: Poor ? ? ?Physical Exam: ?Physical Exam ?Constitutional:   ?   Appearance: Normal appearance.  ?HENT:  ?   Head: Normocephalic and atraumatic.  ?Skin: ?   General: Skin is dry.  ?Neurological:  ?   Mental Status: He is alert and oriented to person, place, and time.  ? ?Review of Systems  ?Psychiatric/Behavioral:  Negative for hallucinations and suicidal ideas. The patient does not have insomnia.   ? ?Blood pressure 109/81, pulse 92, temperature 97.9 ?F (36.6 ?C), temperature source Oral, resp. rate 18, height 5' 8.5" (1.74 m), weight 80.7 kg, SpO2 100 %. Body mass index is 26.67 kg/m?. ? ?Mental Status Per Nursing Assessment::   ?On Admission:  Suicidal ideation indicated by patient ? ?Demographic Factors:  ?Male ? ?Loss Factors: ?NA ? ?Historical Factors: ?Family history of mental illness or substance abuse ? ?Risk Reduction Factors:   ?Positive coping skills or problem solving skills ? ?Continued Clinical Symptoms:  ?Denies ? ?Cognitive Features That Contribute To Risk:  ?None   ? ?Suicide Risk:  ?Mild:  There are no identifiable suicide plans, no associated intent, mild dysphoria and related symptoms, good self-control (both objective and subjective assessment), few other risk factors, and identifiable protective factors, including available and accessible social support. ? ? ? Follow-up  Information   ? ? [place]. Go to.   ?Specialty: Behavioral Health ?Why: Please go to this provider for therapy and medication management services during walk in hours:  Monday, and Wednesdays from 7:30 am to 10:30 am.  Services are available on a first come, first served basis.  Please arrive by 7:30 am. ?Contact information: ?[address] ?[place] [address] ?[phone] ? ?  ?  ? ? [facility] Follow up.   ?Specialty: Addiction Medicine ?Why: Referral made ?Contact information: ?[id number] ?[place] [address] ?[phone] ? ? ?  ?  ? ? Services, [facility] Follow up.   ?Why: Referral made ?Contact information: ?[address] ?[address] ?[place] [address] ?[phone] ? ? ?  ?  ? ?  ?  ? ?  ? ? ?Plan Of Care/Follow-up recommendations:  ?Patient is instructed to take all prescribed medications as recommended. Report any side effects or adverse reactions to your outpatient psychiatrist. Patient is instructed to abstain from alcohol and illegal drugs while on prescription medications. In the event of worsening symptoms, patient is instructed to call the crisis hotline, [phone], or go to [facility], or go to the nearest emergency department for evaluation and treatment.   ? ?Freida Busman, MD ?11/19/2021, 3:00 PM ? ?Total Time Spent in Direct Patient Care:  ?I personally spent 45 minutes on the unit in direct patient care. The direct patient care time included face-to-face time with the patient, reviewing the patient's chart, communicating with other professionals, and coordinating care. Greater than 50% of this time was spent in counseling or coordinating care with the patient regarding goals of hospitalization, psycho-education, and discharge planning needs. ? ?On my assessment the patient denied SI, HI, AVH, paranoia, ideas of reference, or first rank symptoms on day of discharge. Patient denied drug cravings or active  signs of withdrawal. Patient denied  medication side-effects. Patient was not deemed to be a danger to self or others on day of discharge and was in agreement with discharge plans.  ? ?I have independently evaluated the patient

## 2021-11-19 NOTE — BHH Suicide Risk Assessment (Signed)
BHH INPATIENT:  Family/Significant Other Suicide Prevention Education ? ?Suicide Prevention Education:  ?Education Completed; [name] Elenore Paddy [phone], has been identified by the patient as the family member/significant other with whom the patient will be residing, and identified as the person(s) who will aid the patient in the event of a mental health crisis (suicidal ideations/suicide attempt).  With written consent from the patient, the family member/significant other has been provided the following suicide prevention education, prior to the and/or following the discharge of the patient. ? ?CSW spoke with patients ex-girlfriend who confirmed that the gun has been removed from this patients house.  ? ?The suicide prevention education provided includes the following: ?Suicide risk factors ?Suicide prevention and interventions ?National Suicide Hotline telephone number ?[facility] assessment telephone number ?[facility] Emergency Assistance 911 ?[place] and/or Residential Mobile Crisis Unit telephone number ? ?Request made of family/significant other to: ?Remove weapons (e.g., guns, rifles, knives), all items previously/currently identified as safety concern.   ?Remove drugs/medications (over-the-counter, prescriptions, illicit drugs), all items previously/currently identified as a safety concern. ? ?The family member/significant other verbalizes understanding of the suicide prevention education information provided.  The family member/significant other agrees to remove the items of safety concern listed above. ? ?Brian Nolan A Brian Nolan ?11/19/2021, 10:18 AM ?

## 2021-11-20 ENCOUNTER — Encounter (HOSPITAL_COMMUNITY): Payer: Self-pay

## 2021-11-20 NOTE — Discharge Summary (Addendum)
Physician Discharge Summary Note ? ?Patient:  Brian Nolan is an 41 y.o., male ?MRN:  [id number] ?DOB:  1981/08/29 ?Patient phone:  [phone] (home)  ?Patient address:   ?[address] ?[place] [address],  ?Total Time spent with patient: 20 minutes ? ?Date of Admission:  11/15/2021 ?Date of Discharge: 11/20/2021 ? ?Reason for Admission:  Brian Nolan is a 41 yr old male who presented on 3/16 to [facility] with SI and worsening depression in the setting of substance use, he was admitted to [facility] on 3/17. ? ?Principal Problem: Major depressive disorder, recurrent episode, severe ([facility]) ?Discharge Diagnoses: Principal Problem: ?  Major depressive disorder, recurrent episode, severe (H[facility]) ?Active Problems: ?  Substance induced mood disorder ([facility]) ? ? ?Past Psychiatric History: Substance Induced Mood Disorder, Polysubtance use (Crack-cocaine, EtOH, THC) and multiple hospitalizations (latest [facility] 2017). ?  ? ?Past Medical History:  ?Past Medical History:  ?Diagnosis Date  ? Depression   ? Medical history non-contributory   ? Schizophrenia (HCC)   ? Substance abuse (HCC)   ?  ?Past Surgical History:  ?Procedure Laterality Date  ? NO PAST SURGERIES    ? ?Family History:  ?Family History  ?Problem Relation Age of Onset  ? Schizophrenia Maternal Uncle   ? Diabetes Mellitus II Father   ? ?Family Psychiatric  History:  Father- Substance use (clean for 20 years) ?Mother- EtOH abuse ?Uncle: Bipolar Disorder and Schizophrenia, substance use ? ?Social History:  ?Social History  ? ?Substance and Sexual Activity  ?Alcohol Use Yes  ? Alcohol/week: 6.0 - 24.0 standard drinks  ? Types: 6 - 24 Cans of beer per week  ?   ?Social History  ? ?Substance and Sexual Activity  ?Drug Use Yes  ? Types: "Crack" cocaine, Marijuana  ?  ?Social History  ? ?Socioeconomic History  ? Marital status: Single  ?  Spouse name: Not on file  ? Number of children: Not on file  ? Years of education: Not on file  ? Highest education level: Not on file   ?Occupational History  ? Not on file  ?Tobacco Use  ? Smoking status: Former  ?  Packs/day: 1.00  ?  Years: 10.00  ?  Pack years: 10.00  ?  Types: Cigarettes  ?  Quit date: 01/30/2018  ?  Years since quitting: 3.8  ? Smokeless tobacco: Never  ?Vaping Use  ? Vaping Use: Every day  ?Substance and Sexual Activity  ? Alcohol use: Yes  ?  Alcohol/week: 6.0 - 24.0 standard drinks  ?  Types: 6 - 24 Cans of beer per week  ? Drug use: Yes  ?  Types: "Crack" cocaine, Marijuana  ? Sexual activity: Yes  ?  Birth control/protection: Condom  ?Other Topics Concern  ? Not on file  ?Social History Narrative  ? Not on file  ? ?Social Determinants of Health  ? ?Financial Resource Strain: Not on file  ?Food Insecurity: Not on file  ?Transportation Needs: Not on file  ?Physical Activity: Not on file  ?Stress: Not on file  ?Social Connections: Not on file  ? ? ?Hospital Course:   ?Brian Nolan is a 41 yr old male who presented on 3/16 to [facility] with SI and worsening depression in the setting of substance use, he was admitted to [facility] on 3/17. ? ?HOSPITAL COURSE: ? ?During the patient's hospitalization, patient had extensive initial psychiatric evaluation, and follow-up psychiatric evaluations every day. ? ?Psychiatric diagnoses provided upon initial assessment:  ?MDD, Recurrent, Severe, W/O  Psychosis ?Stimulant Use Disorder-Cocaine ?EtOH Withdrawal ?THC Use ? ?Patient's psychiatric medications were adjusted on admission:   ?-Start Remeron 7.5 mg QHS ?-Start Zoloft 25 mg  ?- Start Trazodone 50mg  QHS PRN ? ?During the hospitalization, other adjustments were made to the patient's psychiatric medication regimen:  ?- Remeron 7.5mg  , discontinued due to racing thoughts at night and worsened insomnia ?- Zoloft Increased to 50mg  ?- Trazodone increased to 100mg  and scheduled to QHS ?- Started Seroquel 50mg , for MDD, insomnia and irritability  ? ?Patient's care was discussed during the interdisciplinary team meeting every day during the  hospitalization. ? ?The patient denied having side effects to prescribed psychiatric medications at discharge. Patient did endorse having more rapid thoughts and worse insomnia on Remeron 7.5mg  QHS, which was discontinued.  ? ?Gradually, patient started adjusting to milieu. The patient was evaluated each day by a clinical provider to ascertain response to treatment. Improvement was noted by the patient's report of decreasing symptoms, improved sleep and appetite, affect, medication tolerance, behavior, and participation in unit programming.  Patient was asked each day to complete a self inventory noting mood, mental status, pain, new symptoms, anxiety and concerns.   ?Symptoms were reported as significantly decreased or resolved completely by discharge.  ?The patient reports that their mood is stable.  ?The patient denied having suicidal thoughts for more than 48 hours prior to discharge.  Patient denies having homicidal thoughts.  Patient denies having auditory hallucinations.  Patient denies any visual hallucinations or other symptoms of psychosis.  ?The patient was motivated to continue taking medication with a goal of continued improvement in mental health.  ? ?The patient reports their target psychiatric symptoms of  depressed mood and cravings for substances  responded well to the psychiatric medications, and the patient reports overall benefit other psychiatric hospitalization. Supportive psychotherapy was provided to the patient. The patient also participated in regular group therapy while hospitalized. Coping skills, problem solving as well as relaxation therapies were also part of the unit programming. ? ?Labs were reviewed with the patient, and abnormal results were discussed with the patient. ? ?The patient is able to verbalize their individual safety plan to this provider. ? ?# It is recommended to the patient to continue psychiatric medications as prescribed, after discharge from the hospital.   ? ?#  It is recommended to the patient to follow up with your outpatient psychiatric provider and PCP. ? ?# It was discussed with the patient, the impact of alcohol, drugs, tobacco have been there overall psychiatric and medical wellbeing, and total abstinence from substance use was recommended the patient.ed. ? ?# Prescriptions provided or sent directly to preferred pharmacy at discharge. Patient agreeable to plan. Given opportunity to ask questions. Appears to feel comfortable with discharge.  ?  ?# In the event of worsening symptoms, the patient is instructed to call the crisis hotline, 911 and or go to the nearest ED for appropriate evaluation and treatment of symptoms. To follow-up with primary care provider for other medical issues, concerns and or health care needs ? ?# Patient was [facility] for [facility]hab  with a plan to follow up as noted below.  ? ?Physical Findings: ?AIMS: Facial and Oral Movements ?Muscles of Facial Expression: None, normal ?Lips and Perioral Area: None, normal ?Jaw: None, normal ?Tongue: None, normal,Extremity Movements ?Upper (arms, wrists, hands, fingers): None, normal ?Lower (legs, knees, ankles, toes): None, normal, Trunk Movements ?Neck, shoulders, hips: None, normal, Overall Severity ?Severity of abnormal movements (highest score from questions above):  None, normal ?Incapacitation due to abnormal movements: None, normal ?Patient's awareness of abnormal movements (rate only patient's report): No Awareness, Dental Status ?Current problems with teeth and/or dentures?: No ?Does patient usually wear dentures?: No  ?CIWA:  CIWA-Ar Total: 0 ?COWS:    ? ?Musculoskeletal: ?Strength & Muscle Tone: within normal limits ?Gait & Station: normal ?Patient leans: N/A ? ? ?Psychiatric Specialty Exam: ? ?Presentation  ?General Appearance: Appropriate for Environment; Casual ? ?Eye Contact:Fair ? ?Speech:Clear and Coherent ? ?Speech Volume:Normal ? ?Handedness:Right ? ? ?Mood and Affect  ?Mood:--  ("ok" describes some possible irritabilty) ? ?Affect:Appropriate ? ? ?Thought Process  ?Thought Processes:Goal Directed ? ?Descriptions of Associations:Circumstantial ? ?Orientation:Full (Time, Place and Person) ? ?Thought Co

## 2021-11-20 NOTE — Progress Notes (Signed)
?  [place] Adult Case Management Discharge Plan : ? ?Will you be returning to the same living situation after discharge:  No. Will be discharged to [facility] for inpatient rehab ?At discharge, do you have transportation home?: No. A taxi will be arranged ?Do you have the ability to pay for your medications: No. Samples will be provided at discharge. ? ?Release of information consent forms completed and in the chart;  Patient's signature needed at discharge. ? ?Patient to Follow up at: ? Follow-up Information   ? ? [place]. Go to.   ?Specialty: Behavioral Health ?Why: Please go to this provider for therapy and medication management services during walk in hours:  Monday, and Wednesdays from 7:30 am to 10:30 am.  Services are available on a first come, first served basis.  Please arrive by 7:30 am. ?Contact information: ?[address] ?[place] [address] ?[phone] ? ?  ?  ? ? [place] Follow up.   ?Specialty: Addiction Medicine ?Why: Referral made ?Contact information: ?[id number] ?[place] [address] ?[phone] ? ? ?  ?  ? ? Services, [facility] Follow up.   ?Why: Referral made ?Contact information: ?[address] ?[address] ?[place] [address] ?[phone] ? ? ?  ?  ? ?  ?  ? ?  ? ? ?Next level of care provider has access to [place] ? ?Safety Planning and Suicide Prevention discussed: Yes,  with ex-girlfriend ? ?  ? ?Has patient been referred to the Quitline?: Patient refused referral ? ?Patient has been referred for addiction treatment: Yes. [facility].  ? ?Vassie Moselle, LCSW ?11/20/2021, 8:20 AM ?

## 2021-11-20 NOTE — Progress Notes (Signed)
?   11/19/21 2200  ?Psych Admission Type (Psych Patients Only)  ?Admission Status Voluntary  ?Psychosocial Assessment  ?Patient Complaints None  ?Eye Contact Fair  ?Facial Expression Sad  ?Affect Appropriate to circumstance  ?Speech Logical/coherent  ?Interaction Assertive  ?Motor Activity Slow  ?Appearance/Hygiene Improved  ?Behavior Characteristics Cooperative;Appropriate to situation  ?Mood Pleasant  ?Thought Process  ?Coherency WDL  ?Content WDL  ?Delusions None reported or observed  ?Perception WDL  ?Hallucination None reported or observed  ?Judgment WDL  ?Confusion None  ?Danger to Self  ?Current suicidal ideation? Denies  ?Self-Injurious Behavior No self-injurious ideation or behavior indicators observed or expressed   ?Agreement Not to Harm Self Yes  ?Description of Agreement Verbal  ?Danger to Others  ?Danger to Others None reported or observed  ? ? ?

## 2021-11-20 NOTE — BH IP Treatment Plan (Signed)
Interdisciplinary Treatment and Diagnostic Plan Update ? ?11/20/2021 ?Time of Session: 9:20am  ?Brian Nolan ?MRN: [id number] ? ?Principal Diagnosis: Major depressive disorder, recurrent episode, severe (HCC) ? ?Secondary Diagnoses: Principal Problem: ?  Major depressive disorder, recurrent episode, severe (HCC) ?Active Problems: ?  Substance induced mood disorder (HCC) ? ? ?Current Medications:  ?Current Facility-Administered Medications  ?Medication Dose Route Frequency Provider Last Rate Last Admin  ? acetaminophen (TYLENOL) tablet 650 mg  650 mg Oral Q6H PRN Jaclyn Shaggy, PA-C      ? alum & mag hydroxide-simeth (MAALOX/MYLANTA) 200-200-20 MG/5ML suspension 30 mL  30 mL Oral Q4H PRN Jaclyn Shaggy, PA-C      ? feeding supplement (ENSURE ENLIVE / ENSURE PLUS) liquid 237 mL  237 mL Oral BID BM Massengill, Harrold Donath, MD   237 mL at 11/19/21 1950  ? OLANZapine zydis (ZYPREXA) disintegrating tablet 5 mg  5 mg Oral Q8H PRN Jaclyn Shaggy, PA-C      ? And  ? LORazepam (ATIVAN) tablet 1 mg  1 mg Oral PRN Jaclyn Shaggy, PA-C      ? And  ? ziprasidone (GEODON) injection 20 mg  20 mg Intramuscular PRN Melbourne Abts W, PA-C      ? magnesium hydroxide (MILK OF MAGNESIA) suspension 30 mL  30 mL Oral Daily PRN Jaclyn Shaggy, PA-C      ? multivitamin with minerals tablet 1 tablet  1 tablet Oral Daily Jaclyn Shaggy, PA-C   1 tablet at 11/20/21 0754  ? QUEtiapine (SEROQUEL) tablet 50 mg  50 mg Oral QHS Eliseo Gum B, MD   50 mg at 11/19/21 2110  ? sertraline (ZOLOFT) tablet 50 mg  50 mg Oral Daily Starleen Blue, NP   50 mg at 11/20/21 0754  ? thiamine tablet 100 mg  100 mg Oral Daily Melbourne Abts W, PA-C   100 mg at 11/20/21 8101  ? traZODone (DESYREL) tablet 100 mg  100 mg Oral QHS Starleen Blue, NP   100 mg at 11/19/21 2110  ? ?Current Outpatient Medications  ?Medication Sig Dispense Refill  ? QUEtiapine (SEROQUEL) 50 MG tablet Take 1 tablet (50 mg total) by mouth at bedtime. 30 tablet 0  ? sertraline (ZOLOFT) 50  MG tablet Take 1 tablet (50 mg total) by mouth daily. 30 tablet 0  ? traZODone (DESYREL) 100 MG tablet Take 1 tablet (100 mg total) by mouth at bedtime. 30 tablet 0  ? ?PTA Medications: ?No medications prior to admission.  ? ? ?Patient Stressors: Marital or family conflict   ?Medication change or noncompliance   ? ?Patient Strengths: General fund of knowledge  ?Motivation for treatment/growth  ? ?Treatment Modalities: Medication Management, Group therapy, Case management,  ?1 to 1 session with clinician, Psychoeducation, Recreational therapy. ? ? ?Physician Treatment Plan for Primary Diagnosis: Major depressive disorder, recurrent episode, severe (HCC) ?Long Term Goal(s): Improvement in symptoms so as ready for discharge  ? ?Short Term Goals: Ability to identify changes in lifestyle to reduce recurrence of condition will improve ?Ability to verbalize feelings will improve ?Ability to disclose and discuss suicidal ideas ?Ability to identify and develop effective coping behaviors will improve ?Compliance with prescribed medications will improve ?Ability to identify triggers associated with substance abuse/mental health issues will improve ? ?Medication Management: Evaluate patient's response, side effects, and tolerance of medication regimen. ? ?Therapeutic Interventions: 1 to 1 sessions, Unit Group sessions and Medication administration. ? ?Evaluation of Outcomes: Adequate for Discharge ? ?Physician Treatment Plan  for Secondary Diagnosis: Principal Problem: ?  Major depressive disorder, recurrent episode, severe (HCC) ?Active Problems: ?  Substance induced mood disorder (HCC) ? ?Long Term Goal(s): Improvement in symptoms so as ready for discharge  ? ?Short Term Goals: Ability to identify changes in lifestyle to reduce recurrence of condition will improve ?Ability to verbalize feelings will improve ?Ability to disclose and discuss suicidal ideas ?Ability to identify and develop effective coping behaviors will  improve ?Compliance with prescribed medications will improve ?Ability to identify triggers associated with substance abuse/mental health issues will improve    ? ?Medication Management: Evaluate patient's response, side effects, and tolerance of medication regimen. ? ?Therapeutic Interventions: 1 to 1 sessions, Unit Group sessions and Medication administration. ? ?Evaluation of Outcomes: Adequate for Discharge ? ? ?RN Treatment Plan for Primary Diagnosis: Major depressive disorder, recurrent episode, severe (HCC) ?Long Term Goal(s): Knowledge of disease and therapeutic regimen to maintain health will improve ? ?Short Term Goals: Ability to remain free from injury will improve, Ability to participate in decision making will improve, Ability to verbalize feelings will improve, Ability to disclose and discuss suicidal ideas, and Ability to identify and develop effective coping behaviors will improve ? ?Medication Management: RN will administer medications as ordered by provider, will assess and evaluate patient's response and provide education to patient for prescribed medication. RN will report any adverse and/or side effects to prescribing provider. ? ?Therapeutic Interventions: 1 on 1 counseling sessions, Psychoeducation, Medication administration, Evaluate responses to treatment, Monitor vital signs and CBGs as ordered, Perform/monitor CIWA, COWS, AIMS and Fall Risk screenings as ordered, Perform wound care treatments as ordered. ? ?Evaluation of Outcomes: Adequate for Discharge ? ? ?LCSW Treatment Plan for Primary Diagnosis: Major depressive disorder, recurrent episode, severe (HCC) ?Long Term Goal(s): Safe transition to appropriate next level of care at discharge, Engage patient in therapeutic group addressing interpersonal concerns. ? ?Short Term Goals: Engage patient in aftercare planning with referrals and resources, Increase social support, Increase emotional regulation, Facilitate acceptance of mental health  diagnosis and concerns, Identify triggers associated with mental health/substance abuse issues, and Increase skills for wellness and recovery ? ?Therapeutic Interventions: Assess for all discharge needs, 1 to 1 time with Child psychotherapist, Explore available resources and support systems, Assess for adequacy in community support network, Educate family and significant other(s) on suicide prevention, Complete Psychosocial Assessment, Interpersonal group therapy. ? ?Evaluation of Outcomes: Adequate for Discharge ? ? ?Progress in Treatment: ?Attending groups: No. ?Participating in groups: No. ?Taking medication as prescribed: Yes. ?Toleration medication: Yes. ?Family/Significant other contact made: No, will contact:  declined consents ?Patient understands diagnosis: Yes. ?Discussing patient identified problems/goals with staff: Yes. ?Medical problems stabilized or resolved: Yes. ?Denies suicidal/homicidal ideation: Yes. ?Issues/concerns per patient self-inventory: No. ?  ?  ?New problem(s) identified: No, Describe:  none ?  ?New Short Term/Long Term Goal(s): detox, medication management for mood stabilization; elimination of SI thoughts; development of comprehensive mental wellness/sobriety plan ?  ?Patient Goals:  "To go to rehab" ?  ?Discharge Plan or Barriers: Patient discharged home and will follow up with a local mental health provider for therapy and medication management. ?  ?  ?Reason for Continuation of Hospitalization:  ?Medication stabilization ? ?  ?Estimated Length of Stay: Adequate for Discharge  ? ? ?Scribe for Treatment Team: ?Aram Beecham, [place] ?11/20/2021 ?10:56 AM ?

## 2021-11-20 NOTE — Progress Notes (Signed)
Discharge note: RN met with pt and reviewed pt's discharge instructions. Pt verbalized understanding of discharge instructions and pt did not have any questions. RN reviewed and provided pt with a copy of SRA, AVS and Transition Record. RN returned pt's belongings to pt.  Prescriptions and samples were given to pt. Pt denied SI/HI/AVH and voiced no concerns. Pt was appreciative of the care pt received at [facility]. Patient discharged to the lobby without incident.  ? 11/20/21 0800  ?Psych Admission Type (Psych Patients Only)  ?Admission Status Voluntary  ?Psychosocial Assessment  ?Patient Complaints None  ?Eye Contact Fair  ?Facial Expression Other (Comment) ?(WDL)  ?Affect Appropriate to circumstance  ?Speech Logical/coherent  ?Interaction Assertive  ?Motor Activity Other (Comment) ?(WDL)  ?Appearance/Hygiene Improved  ?Behavior Characteristics Cooperative;Appropriate to situation;Calm  ?Mood Pleasant  ?Thought Process  ?Coherency WDL  ?Content WDL  ?Delusions None reported or observed  ?Perception WDL  ?Hallucination None reported or observed  ?Judgment WDL  ?Confusion None  ?Danger to Self  ?Current suicidal ideation? Denies  ?Danger to Others  ?Danger to Others None reported or observed  ? ? ?

## 2021-12-25 ENCOUNTER — Encounter (HOSPITAL_COMMUNITY): Payer: Self-pay | Admitting: Physician Assistant

## 2021-12-25 ENCOUNTER — Ambulatory Visit (INDEPENDENT_AMBULATORY_CARE_PROVIDER_SITE_OTHER): Payer: No Payment, Other | Admitting: Physician Assistant

## 2021-12-25 VITALS — BP 112/81 | HR 78 | Ht 70.0 in | Wt 220.0 lb

## 2021-12-25 DIAGNOSIS — F332 Major depressive disorder, recurrent severe without psychotic features: Secondary | ICD-10-CM | POA: Diagnosis not present

## 2021-12-25 DIAGNOSIS — G479 Sleep disorder, unspecified: Secondary | ICD-10-CM

## 2021-12-25 MED ORDER — QUETIAPINE FUMARATE 50 MG PO TABS: 50.0000 mg | ORAL_TABLET | Freq: Every day | ORAL | 0 refills | Status: DC

## 2021-12-25 MED ORDER — SERTRALINE HCL 50 MG PO TABS: 50.0000 mg | ORAL_TABLET | Freq: Every day | ORAL | 0 refills | Status: DC

## 2021-12-25 MED ORDER — TRAZODONE HCL 100 MG PO TABS: 100.0000 mg | ORAL_TABLET | Freq: Every day | ORAL | 0 refills | Status: DC

## 2021-12-25 NOTE — Progress Notes (Signed)
Psychiatric Initial Adult Assessment  ? ?Patient Identification: Brian CosierJohn Anthony Gail Nolan ?MRN:  [id number] ?Date of Evaluation:  12/25/2021 ?Referral Source: Follow up appointment [facility] ?Chief Complaint:   ?Chief Complaint  ?Patient presents with  ? WALK-IN  ?  *walk-in* med refill  ? ?Visit Diagnosis:  ?  ICD-10-CM   ?1. Severe episode of recurrent major depressive disorder, without psychotic features (HCC)  F33.2 QUEtiapine (SEROQUEL) 50 MG tablet  ?  sertraline (ZOLOFT) 50 MG tablet  ?  ?2. Sleep disturbances  G47.9 traZODone (DESYREL) 100 MG tablet  ?  ? ? ?History of Present Illness:   ? ?Brian Nolan is a 41 year old male with a past psychiatric history significant for major depressive disorder who [facility] a follow-up appointment following [facility]. ? ?Patient presents today requesting refills on his medications that were dispensed to him while at [facility].  Patient would also like to be signed up with a therapist.  Patient reports that [facility], a 28-day program.  Patient was admitted in March and was discharged from the facility on 12/19/2021.  Patient states that [facility] consisting of excessive alcohol consumption and drug use.  Since successfully completing the program and being [facility], patient has received his 30-day chip that illustrates his sobriety from substance use.  Patient reports that he was taking the following medications [facility]: Seroquel 50 mg at bedtime, sertraline 50 mg daily, and trazodone 100 mg at bedtime.  Patient reports that these medications have been working well.  He states that the medications have made him more positive and more motivated to do things.  He reports that the medications have also curbed his need to use illicit  substances. ? ?Prior to [facility], patient reports that he was struggling with deep depression.  Patient's depression was characterized by the following symptoms: low mood, lack of motivation, and difficulty getting out of bed.  In regards to his depression, patient states that the most difficult thing for him was getting out of bed.  Patient states that he has purpose now now that he is on medications.  He reports that life has been more manageable and has been taking steps to continue to improve it.  Patient denies depressive symptoms.  He still endorses some anxiety and rates his anxiety at 5 out of 10.  Patient denies panic attacks and further denies any new stressors at this time. ? ?Patient endorses past hospitalization due to mental health and substance abuse.  Patient denies past suicide attempt but states that when he had thoughts, he was close to ending his life.  Patient states that he wants to live and succeed now that he is on medication and has a foreseeable past.  Patient denies a past history of self-harm.  A GAD-7 screen was performed with the patient scoring a 3.  Patient is requesting 90-day supplies of his medications following conclusion of the encounter. ? ?Patient is alert and oriented x4, calm, cooperative, and fully engaged in conversation during the encounter.  Patient appears to be in a good mood and does not look to be in any acute distress.  Patient denies suicidal or homicidal ideations.  He further denies auditory or visual hallucinations and does not appear to be responding to internal/external stimuli.  Patient endorses  good sleep and receives on average 7 hours of sleep each night.  Patient endorses good appetite and eats throughout the day.  Patient denies current alcohol consumption stating that the last time he drank was on 11/14/2021.  Patient denies tobacco use stating that he last used on 11/14/2021.  Prior to quitting tobacco use, patient was smoking  roughly 2 packs a day.  Patient denies current illicit drug use and last used on 11/14/2021.  When patient was using, patient would use marijuana and crack cocaine. ? ?Associated Signs/Symptoms: ?Depression Symptoms:  weight gain, ?(Hypo) Manic Symptoms:  Distractibility, ?Anxiety Symptoms:  Excessive Worry, ?Psychotic Symptoms:   None ?PTSD Symptoms: ?Had a traumatic exposure:  Patient denies traumatic experiences ?Had a traumatic exposure in the last month:  N/A ?Re-experiencing:  None ?Hypervigilance:  Patient likes to be aware of his surrounding ?Hyperarousal:  Increased Startle Response ?Avoidance:  None ? ?Past Psychiatric History:  ?Major depressive disorder ? ?Previous Psychotropic Medications: Yes  ? ?Substance Abuse History in the last 12 months:  Yes.   ? ?Consequences of Substance Abuse: ?Medical Consequences:  Patient was admitted to [facility] for treatment of his substance abuse ?Legal Consequences:  Patient reports that he has to go to court for grabbing some beers out of the store ?Family Consequences:  Patient reports that his father was disappointed in him regarding his substance abuse ?Blackouts:  None ?DT's: None ?Withdrawal Symptoms:   None ? ?Past Medical History:  ?Past Medical History:  ?Diagnosis Date  ? Depression   ? Medical history non-contributory   ? Schizophrenia (HCC)   ? Substance abuse (HCC)   ?  ?Past Surgical History:  ?Procedure Laterality Date  ? NO PAST SURGERIES    ? ? ?Family Psychiatric History:  ?Uncle (maternal) - bipolar and schizophrenia ? ?Family History:  ?Family History  ?Problem Relation Age of Onset  ? Schizophrenia Maternal Uncle   ? Diabetes Mellitus II Father   ? ? ?Social History:   ?Social History  ? ?Socioeconomic History  ? Marital status: Single  ?  Spouse name: Not on file  ? Number of children: Not on file  ? Years of education: Not on file  ? Highest education level: Not on file  ?Occupational History  ? Not on file  ?Tobacco Use  ? Smoking status: Former  ?   Packs/day: 1.00  ?  Years: 10.00  ?  Pack years: 10.00  ?  Types: Cigarettes  ?  Quit date: 01/30/2018  ?  Years since quitting: 3.9  ? Smokeless tobacco: Never  ?Vaping Use  ? Vaping Use: Every day  ?Substance and Sexual Activity  ? Alcohol use: Yes  ?  Alcohol/week: 6.0 - 24.0 standard drinks  ?  Types: 6 - 24 Cans of beer per week  ? Drug use: Yes  ?  Types: "Crack" cocaine, Marijuana  ? Sexual activity: Yes  ?  Birth control/protection: Condom  ?Other Topics Concern  ? Not on file  ?Social History Narrative  ? Not on file  ? ?Social Determinants of Health  ? ?Financial Resource Strain: Not on file  ?Food Insecurity: Not on file  ?Transportation Needs: Not on file  ?Physical Activity: Not on file  ?Stress: Not on file  ?Social Connections: Not on file  ? ? 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  Patient is currently living at a [employer] [place].  Patient endorses transportation  and uses the public buses.  Patient endorses social support.  Patient is interested in being set up with counseling following the conclusion of the encounter. ? ?Allergies:  No Known Allergies ? ?Metabolic Disorder Labs: ?Lab Results  ?Component Value Date  ? HGBA1C 5.1 11/16/2021  ? MPG 99.67 11/16/2021  ? MPG 108 10/12/2015  ? ?No results found for: PROLACTIN ?Lab Results  ?Component Value Date  ? CHOL 200 11/16/2021  ? TRIG 221 (H) 11/16/2021  ? HDL 67 11/16/2021  ? CHOLHDL 3.0 11/16/2021  ? VLDL 44 (H) 11/16/2021  ? LDLCALC 89 11/16/2021  ? LDLCALC UNABLE TO CALCULATE IF TRIGLYCERIDE OVER 400 mg/dL 40/97/3532  ? ?Lab Results  ?Component Value Date  ? TSH 1.225 11/16/2021  ? ? ?Therapeutic Level Labs: ?No results found for: LITHIUM ?No results found for: CBMZ ?No results found for: VALPROATE ? ?Current Medications: ?Current Outpatient Medications  ?Medication Sig Dispense Refill  ? QUEtiapine (SEROQUEL) 50 MG tablet Take 1 tablet (50 mg total) by mouth at  bedtime. 90 tablet 0  ? sertraline (ZOLOFT) 50 MG tablet Take 1 tablet (50 mg total) by mouth daily. 90 tablet 0  ? traZODone (DESYREL) 100 MG tablet Take 1 tablet (100 mg total) by mouth at bedtime. 90

## 2022-02-05 ENCOUNTER — Telehealth (HOSPITAL_COMMUNITY): Payer: No Payment, Other | Admitting: Physician Assistant

## 2022-02-05 ENCOUNTER — Encounter (HOSPITAL_COMMUNITY): Payer: Self-pay

## 2022-02-06 ENCOUNTER — Encounter (HOSPITAL_COMMUNITY): Payer: Self-pay

## 2022-02-06 ENCOUNTER — Emergency Department (HOSPITAL_COMMUNITY)
Admission: EM | Admit: 2022-02-06 | Discharge: 2022-02-07 | Disposition: A | Discharge status: Home / Self Care | Payer: Self-pay | Attending: Emergency Medicine | Admitting: Emergency Medicine

## 2022-02-06 ENCOUNTER — Emergency Department (HOSPITAL_COMMUNITY): Payer: Self-pay

## 2022-02-06 ENCOUNTER — Other Ambulatory Visit: Payer: Self-pay

## 2022-02-06 DIAGNOSIS — R202 Paresthesia of skin: Secondary | ICD-10-CM | POA: Insufficient documentation

## 2022-02-06 DIAGNOSIS — R079 Chest pain, unspecified: Secondary | ICD-10-CM | POA: Insufficient documentation

## 2022-02-06 LAB — CBC
HCT: 41.4 % (ref 39.0–52.0)
Hemoglobin: 14.6 g/dL (ref 13.0–17.0)
MCH: 30.3 pg (ref 26.0–34.0)
MCHC: 35.3 g/dL (ref 30.0–36.0)
MCV: 85.9 fL (ref 80.0–100.0)
Platelets: 211 10*3/uL (ref 150–400)
RBC: 4.82 MIL/uL (ref 4.22–5.81)
RDW: 13.2 % (ref 11.5–15.5)
WBC: 8.9 10*3/uL (ref 4.0–10.5)
nRBC: 0 % (ref 0.0–0.2)

## 2022-02-06 LAB — BASIC METABOLIC PANEL
Anion gap: 7 (ref 5–15)
BUN: 16 mg/dL (ref 6–20)
CO2: 24 mmol/L (ref 22–32)
Calcium: 9.2 mg/dL (ref 8.9–10.3)
Chloride: 107 mmol/L (ref 98–111)
Creatinine, Ser: 0.94 mg/dL (ref 0.61–1.24)
GFR, Estimated: >60 mL/min (ref >60)
Glucose, Bld: 103 mg/dL — ABNORMAL HIGH (ref 70–99)
Potassium: 4.2 mmol/L (ref 3.5–5.1)
Sodium: 138 mmol/L (ref 135–145)

## 2022-02-06 LAB — TROPONIN I (HIGH SENSITIVITY)
Troponin I (High Sensitivity): 3 ng/L (ref <18)
Troponin I (High Sensitivity): 4 ng/L (ref <18)

## 2022-02-06 NOTE — ED Triage Notes (Signed)
Patient reports initially numbness tingling in bilateral hands when he was sleeping but now it is 24 hrs a day.  Patient reports had tingling in head today too.

## 2022-02-06 NOTE — ED Notes (Signed)
PA tried to discharge patient and patient started complaining of chest pain.

## 2022-02-06 NOTE — ED Provider Triage Note (Signed)
Emergency Medicine Provider Triage Evaluation Note  Honor Fairbank , a 41 y.o. male  was evaluated in triage.  Pt complains of numbness in his wrist bilaterally.  This been going on for a month, its been constant for the last few days.  Patient also reports "not feeling right".  Feels like something is going wrong, there is a numbness and tingling in his brain as well as in his wrist bilaterally.  Complaining of chest pain in the middle of his chest which is worse whenever he breathes.  This comes and goes..  Review of Systems  Per HPI  Physical Exam  BP 127/90 (BP Location: Right Arm)   Pulse 88   Temp 98.7 F (37.1 C) (Oral)   Resp 16   SpO2 99%  Gen:   Awake, no distress   Resp:  Normal effort  MSK:   Moves extremities without difficulty  Other:  Positive Tennille sign.  Cranial nerves II through XII are grossly intact.  Medical Decision Making  Medically screening exam initiated at 6:21 PM.  Appropriate orders placed.  La Thomes Dinning was informed that the remainder of the evaluation will be completed by another provider, this initial triage assessment does not replace that evaluation, and the importance of remaining in the ED until their evaluation is complete.  Suspect carpal tunnel, patient complaining about chest pain so we will add chest pain labs.   Theron Arista, PA-C 02/06/22 Rickey Primus

## 2022-02-07 MED ORDER — KETOROLAC TROMETHAMINE 60 MG/2ML IM SOLN
30.0000 mg | Freq: Once | INTRAMUSCULAR | Status: AC
  Administered 2022-02-07: 30 mg via INTRAMUSCULAR
  Filled 2022-02-07: qty 2

## 2022-02-07 NOTE — ED Provider Notes (Signed)
[facility] EMERGENCY DEPARTMENT Provider Note  CSN: [id number] Arrival date & time: 02/06/22 1658  Chief Complaint(s) Numbness, Chest Pain, and Headache  HPI Brian Nolan is a 41 y.o. male with a past medical history listed below who presents to the emergency department primarily for bilateral hand numbness.  He reports that this has been ongoing for several weeks, intermittently and mostly at night.  For the past 2 days he has had numbness and tingling sensation in bilateral hands from the wrist down that has not resolved.  He denies any associated weakness.  Grip strength is intact.  No associated swelling.  Patient is still able to function at work.  He does have intermittent sharp shooting pains from the hands to the forearm.  He also noted mild tingling in his forehead earlier, that was brief and resolved.  Additionally he reports having intermittent right upper chest earlier today.  He reports that this is ongoing intermittent for several months.  Usually associated with taking a deep breath which will eventually resolved.  Today's episode occurred earlier this morning and has not occurred since.  Chest pain was nonexertional.  Nonradiating.  No associated shortness of breath.  He denies any recent fevers, coughs, congestion.  The history is provided by the patient.    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 Authorizing Provider  QUEtiapine (SEROQUEL) 50 MG tablet Take 1 tablet (50 mg total) by mouth at bedtime. 12/25/21   Nwoko, Terese Door, PA  sertraline (ZOLOFT) 50 MG tablet Take 1 tablet (50 mg total) by mouth daily. 12/25/21   Nwoko, Terese Door, PA  traZODone (DESYREL) 100 MG tablet Take 1 tablet (100 mg total) by mouth at bedtime. 12/25/21   Malachy Mood, PA                                                                                                                                    Allergies Patient has no known allergies.  Review of Systems Review of Systems As noted in HPI  Physical Exam Vital Signs  I have reviewed  the triage vital signs BP (!) 133/115   Pulse 87   Temp 98.3 F (36.8 C) (Oral)   Resp 13   SpO2 99%   Physical Exam Vitals reviewed.  Constitutional:      General: He is not in acute distress.    Appearance: He is well-developed. He is not diaphoretic.  HENT:     Head: Normocephalic and atraumatic.     Nose: Nose normal.  Eyes:     General: No scleral icterus.       Right eye: No discharge.        Left eye: No discharge.     Conjunctiva/sclera: Conjunctivae normal.     Pupils: Pupils are equal, round, and reactive to light.  Cardiovascular:     Rate and Rhythm: Normal rate and regular rhythm.     Heart sounds: No murmur heard.    No friction rub. No gallop.  Pulmonary:     Effort: Pulmonary effort is normal. No respiratory distress.     Breath sounds: Normal breath sounds. No stridor. No rales.  Abdominal:     General: There is no distension.     Palpations: Abdomen is soft.     Tenderness: There is no abdominal tenderness.  Musculoskeletal:        General: No  tenderness.     Right hand: No swelling, tenderness or bony tenderness. Normal range of motion. Normal strength. Normal sensation. Normal capillary refill. Normal pulse.     Left hand: No swelling, tenderness or bony tenderness. Normal range of motion. Normal strength. Normal sensation. Normal capillary refill. Normal pulse.     Cervical back: Normal range of motion and neck supple.  Skin:    General: Skin is warm and dry.     Findings: No erythema or rash.  Neurological:     Mental Status: He is alert and oriented to person, place, and time.     ED Results and Treatments Labs (all labs ordered are listed, but only abnormal results are displayed) Labs Reviewed  BASIC METABOLIC PANEL - Abnormal; Notable for the following components:      Result Value   Glucose, Bld 103 (*)    All other components within normal limits  CBC  TROPONIN I (HIGH SENSITIVITY)  TROPONIN I (HIGH SENSITIVITY)                                                                                                                         EKG  EKG Interpretation  Date/Time:  Thursday February 06 2022 18:35:33 EDT Ventricular Rate:  87 PR Interval:  130 QRS Duration: 76 QT Interval:  340 QTC Calculation: 409 R Axis:   29 Text Interpretation: Normal sinus rhythm Normal ECG When compared with ECG of 14-Nov-2021 07:48, PREVIOUS ECG IS PRESENT No acute changes Confirmed by Addison Lank [phone]) on 02/07/2022 12:16:51 AM       Radiology DG Chest 2 View  Result Date: 02/06/2022 CLINICAL DATA:  Chest pain.  EXAM: CHEST - 2 VIEW COMPARISON:  May 31, 2018 FINDINGS: The heart size and mediastinal contours are within normal limits. Both lungs are clear. The visualized skeletal structures are unremarkable. IMPRESSION: No active cardiopulmonary disease. Electronically Signed   By: Virgina Norfolk M.D.   On: 02/06/2022 19:14    Pertinent labs & imaging results that were available during my care of the patient were reviewed by  me and considered in my medical decision making (see MDM for details).  Medications Ordered in ED Medications  ketorolac (TORADOL) injection 30 mg (30 mg Intramuscular Given 02/07/22 0053)                                                                                                                                     Procedures Procedures  (including critical care time)  Medical Decision Making / ED Course    Complexity of Problem:  Co-morbidities/SDOH that complicate the patient evaluation/care: Noted above in HPI  Patient's presenting problem/concern, DDX, and MDM listed below: Bilateral hand numbness and tingling Possible carpal or cubital tunnel syndrome. Patient has intact strength and sensation.  Doubt central process.  Right-sided chest pain Atypical for ACS. Low suspicion for PE, or dissection. Low suspicion for pneumonia or pneumothorax  Hospitalization Considered:  no  Initial Intervention:  IM toradol    Complexity of Data:   Cardiac Monitoring: The patient was maintained on a cardiac monitor.   I personally viewed and interpreted the cardiac monitored which showed an underlying rhythm of normal sinus rhythm with rates in the 70s to 80s  Laboratory Tests ordered listed below with my independent interpretation: CBC without leukocytosis or anemia No significant electrolyte derangements or renal sufficiency Serial troponins were negative x2   Imaging Studies ordered listed below with my independent interpretation: Chest x-ray without evidence of pneumonia, pneumothorax, pneumomediastinum, pulmonary edema, or abnormal mediastinal contour     ED Course:    Assessment, Add'l Intervention, and Reassessment: Bilateral hand paresthesias Possible overuse syndrome versus carpal/cubital tunnel Provided with IM Toradol Recommended rehab exercises at home Sports medicine/orthopedic follow-up recommended  Chest pain Atypical and ruled out for ACS.  No evidence  of pneumonia, pneumothorax or evidence concerning for esophageal perforation.  Nonconcerning for PE/dissection.  Final Clinical Impression(s) / ED Diagnoses Final diagnoses:  Right-sided chest pain  Paresthesia of both hands   The patient appears reasonably screened and/or stabilized for discharge and I doubt any other medical condition or other [facility] requiring further screening, evaluation, or treatment in the ED at this time prior to discharge. Safe for discharge with strict return precautions.  Disposition: Discharge  Condition: Good  I have discussed the results, Dx and Tx plan with the patient/family who expressed understanding and agree(s) with the plan. Discharge instructions discussed at length. The patient/family was given strict return precautions who verbalized understanding of the instructions. No further questions at time of discharge.    ED Discharge  Orders     None        Follow Up: Primary care provider  Call  contact [facility] at [phone] for referral  Leandrew Koyanagi, MD [place] [phone]  Call  as needed           This chart was dictated using voice recognition software.  Despite best efforts to proofread,  errors can occur which can change the documentation meaning.    Fatima Blank, MD 02/07/22 [phone]

## 2022-02-07 NOTE — ED Notes (Signed)
RN reviewed discharge instructions with pt. Pt verbalized understanding and had no further questions. VSS upon discharge.  

## 2022-03-07 ENCOUNTER — Ambulatory Visit (HOSPITAL_COMMUNITY): Payer: Federal, State, Local not specified - Other | Admitting: Clinical

## 2022-03-11 ENCOUNTER — Other Ambulatory Visit: Payer: Self-pay

## 2022-03-11 ENCOUNTER — Encounter (HOSPITAL_COMMUNITY): Payer: Self-pay | Admitting: *Deleted

## 2022-03-11 ENCOUNTER — Emergency Department (HOSPITAL_COMMUNITY)
Admission: EM | Admit: 2022-03-11 | Discharge: 2022-03-11 | Disposition: A | Discharge status: Other Acute Inpatient Hospital | Payer: Self-pay | Attending: Emergency Medicine | Admitting: Emergency Medicine

## 2022-03-11 DIAGNOSIS — F332 Major depressive disorder, recurrent severe without psychotic features: Secondary | ICD-10-CM | POA: Insufficient documentation

## 2022-03-11 DIAGNOSIS — Z20822 Contact with and (suspected) exposure to covid-19: Secondary | ICD-10-CM | POA: Insufficient documentation

## 2022-03-11 DIAGNOSIS — R45851 Suicidal ideations: Secondary | ICD-10-CM | POA: Insufficient documentation

## 2022-03-11 DIAGNOSIS — Z85118 Personal history of other malignant neoplasm of bronchus and lung: Secondary | ICD-10-CM | POA: Insufficient documentation

## 2022-03-11 LAB — URINALYSIS, ROUTINE W REFLEX MICROSCOPIC
Bilirubin Urine: NEGATIVE
Glucose, UA: NEGATIVE mg/dL
Hgb urine dipstick: NEGATIVE
Ketones, ur: NEGATIVE mg/dL
Nitrite: NEGATIVE
Protein, ur: NEGATIVE mg/dL
Specific Gravity, Urine: 1.02 (ref 1.005–1.030)
pH: 5 (ref 5.0–8.0)

## 2022-03-11 LAB — CBC
HCT: 43 % (ref 39.0–52.0)
Hemoglobin: 14.6 g/dL (ref 13.0–17.0)
MCH: 29.8 pg (ref 26.0–34.0)
MCHC: 34 g/dL (ref 30.0–36.0)
MCV: 87.8 fL (ref 80.0–100.0)
Platelets: 187 10*3/uL (ref 150–400)
RBC: 4.9 MIL/uL (ref 4.22–5.81)
RDW: 13.7 % (ref 11.5–15.5)
WBC: 8.3 10*3/uL (ref 4.0–10.5)
nRBC: 0 % (ref 0.0–0.2)

## 2022-03-11 LAB — RAPID URINE DRUG SCREEN, HOSP PERFORMED
Amphetamines: NOT DETECTED
Barbiturates: NOT DETECTED
Benzodiazepines: NOT DETECTED
Cocaine: POSITIVE — AB
Opiates: NOT DETECTED
Tetrahydrocannabinol: NOT DETECTED

## 2022-03-11 LAB — SALICYLATE LEVEL: Salicylate Lvl: <7 mg/dL — ABNORMAL LOW (ref 7.0–30.0)

## 2022-03-11 LAB — RESP PANEL BY RT-PCR (FLU A&B, COVID) ARPGX2
Influenza A by PCR: NEGATIVE
Influenza B by PCR: NEGATIVE
SARS Coronavirus 2 by RT PCR: NEGATIVE

## 2022-03-11 LAB — COMPREHENSIVE METABOLIC PANEL
ALT: 15 U/L (ref 0–44)
AST: 23 U/L (ref 15–41)
Albumin: 3.9 g/dL (ref 3.5–5.0)
Alkaline Phosphatase: 87 U/L (ref 38–126)
Anion gap: 9 (ref 5–15)
BUN: 9 mg/dL (ref 6–20)
CO2: 26 mmol/L (ref 22–32)
Calcium: 9.8 mg/dL (ref 8.9–10.3)
Chloride: 106 mmol/L (ref 98–111)
Creatinine, Ser: 0.83 mg/dL (ref 0.61–1.24)
GFR, Estimated: >60 mL/min (ref >60)
Glucose, Bld: 110 mg/dL — ABNORMAL HIGH (ref 70–99)
Potassium: 3.7 mmol/L (ref 3.5–5.1)
Sodium: 141 mmol/L (ref 135–145)
Total Bilirubin: 0.5 mg/dL (ref 0.3–1.2)
Total Protein: 7.6 g/dL (ref 6.5–8.1)

## 2022-03-11 LAB — ETHANOL: Alcohol, Ethyl (B): <10 mg/dL (ref <10)

## 2022-03-11 LAB — ACETAMINOPHEN LEVEL: Acetaminophen (Tylenol), Serum: <10 ug/mL — ABNORMAL LOW (ref 10–30)

## 2022-03-11 MED ORDER — TRAZODONE HCL 100 MG PO TABS: 100.0000 mg | ORAL_TABLET | Freq: Every day | ORAL | Status: DC

## 2022-03-11 MED ORDER — SERTRALINE HCL 50 MG PO TABS
50.0000 mg | ORAL_TABLET | Freq: Every day | ORAL | Status: DC
  Administered 2022-03-11: 50 mg via ORAL
  Filled 2022-03-11: qty 1

## 2022-03-11 MED ORDER — QUETIAPINE FUMARATE 50 MG PO TABS: 50.0000 mg | ORAL_TABLET | Freq: Every day | ORAL | Status: DC

## 2022-03-11 MED ORDER — TRAZODONE HCL 100 MG PO TABS: 50.0000 mg | ORAL_TABLET | Freq: Every day | ORAL | Status: DC

## 2022-03-11 NOTE — Consult Note (Signed)
[facility] ED ASSESSMENT   Reason for Consult:  Psychiatry evaluation Referring Physician:  ER Physician Patient Identification: Brian Nolan MRN:  [id number] ED Chief Complaint: Major depressive disorder, recurrent episode, severe (HCC)  Diagnosis:  Principal Problem:   Major depressive disorder, recurrent episode, severe (HCC) Active Problems:   Suicidal ideation   ED Assessment Time Calculation: Start Time: 1129 Stop Time: 1200 Total Time in Minutes (Assessment Completion): 31   Subjective:   Brian Nolan is a 41 y.o. male patient admitted with hx significant for Depression, anxiety and Polysubstance abuse came to the ER with .c/o of "have a lot of thoughts going through my head.  These thoughts tells me to kill myself and I don't mind if I go to bed and not wake up"  Patient admitted stopping his medications after he felt well and did not think he needed medications anymore.  HPI:  Patient was seen this morning and he reported feeling very depressed and stressed out.  He rated depression/anxiety 10/10 with 10 being severe depression and anxiety.  His stressors are recent stage four Lung Cancer diagnosis for his mother who is now a DNR and living in a Nursing home.  Patient stated this sad news made him feel hopeless and helpless and coupled with not taking Medications.  He reported poor energy, low motivation and hypersomnia and excessive eating.  He admitted using Alcohol and Cocaine to cope with the stress.  Patient drinks 12 pack daily and uses Cocaine daily.  He remains suicidal with plan to run into a car and die before his mother passes on. 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  But when he stopped taking Medications and with the news about his mother he became depressed, sad and suicidal.  He meets criteria for inpatient hospitalization, we will seek inpatient  Psychiatric bed and resume home medications,.  He denied HI/AVH and no paranoia.  Past Psychiatric History: Substance Induced Mood Disorder, Polysubtance use (Crack-cocaine, EtOH, THC) and multiple hospitalizations (latest [facility] 20 23 March) Hx of previous cutting his arm   Risk to Self or Others: Is the patient at risk to self? Yes Has the patient been a risk to self in the past 6 months? Yes Has the patient been a risk to self within the distant past? Yes Is the patient a risk to others? Yes Has the patient been a risk to others in the past 6 months? Yes Has the patient been a risk to others within the distant past? Yes  [place] Scale:  Flowsheet Row ED from 03/11/2022 in [facility] ED from 02/06/2022 in [facility] EMERGENCY DEPARTMENT Office Visit from 12/25/2021 in [facility]  C-SSRS RISK CATEGORY High Risk No Risk Low Risk       AIMS:  , , ,  ,   ASAM:    Substance Abuse:     Past Medical History:  Past Medical History:  Diagnosis Date   Depression    Medical history non-contributory    Schizophrenia (HCC)    Substance abuse (HCC)     Past Surgical History:  Procedure Laterality Date   NO PAST SURGERIES     Family History:  Family History  Problem Relation Age of Onset   Schizophrenia Maternal Uncle    Diabetes Mellitus II Father    Family Psychiatric  History: Father- Substance use (clean for 20 years) Mother- EtOH abuse Uncle: Bipolar Disorder and Schizophrenia, substance use Social History:  Social History   Substance and Sexual Activity  Alcohol Use Not Currently   Alcohol/week: 6.0 - 24.0 standard drinks of alcohol   Types: 6 - 24 Cans of beer per week   Comment: 3 months sober     Social History   Substance and Sexual Activity  Drug Use Not Currently   Types: "Crack" cocaine, Marijuana    Social History   Socioeconomic History   Marital status: Single    Spouse  name: Not on file   Number of children: Not on file   Years of education: Not on file   Highest education level: Not on file  Occupational History   Not on file  Tobacco Use   Smoking status: Former    Packs/day: 1.00    Years: 10.00    Total pack years: 10.00    Types: Cigarettes    Quit date: 01/30/2018    Years since quitting: 4.1   Smokeless tobacco: Never  Vaping Use   Vaping Use: Former  Substance and Sexual Activity   Alcohol use: Not Currently    Alcohol/week: 6.0 - 24.0 standard drinks of alcohol    Types: 6 - 24 Cans of beer per week    Comment: 3 months sober   Drug use: Not Currently    Types: "Crack" cocaine, Marijuana   Sexual activity: Yes    Birth control/protection: Condom  Other Topics Concern   Not on file  Social History Narrative   Not on file   Social Determinants of Health   Financial Resource Strain: Not on file  Food Insecurity: Not on file  Transportation Needs: Not on file  Physical Activity: Not on file  Stress: Not on file  Social Connections: Not on file   Additional Social History:    Allergies:  No Known Allergies  Labs:  Results for orders placed or performed during the hospital encounter of 03/11/22 (from the past 48 hour(s))  Rapid urine drug screen (hospital performed)     Status: Abnormal   Collection Time: 03/11/22  7:31 AM  Result Value Ref Range   Opiates NONE DETECTED NONE DETECTED   Cocaine POSITIVE (A) NONE DETECTED   Benzodiazepines NONE DETECTED NONE DETECTED   Amphetamines NONE DETECTED NONE DETECTED   Tetrahydrocannabinol NONE DETECTED NONE DETECTED   Barbiturates NONE DETECTED NONE DETECTED    Comment: (NOTE) DRUG SCREEN FOR MEDICAL PURPOSES ONLY.  IF CONFIRMATION IS NEEDED FOR ANY PURPOSE, NOTIFY LAB WITHIN 5 DAYS.  LOWEST DETECTABLE LIMITS FOR URINE DRUG SCREEN Drug Class                     Cutoff (ng/mL) Amphetamine and metabolites    1000 Barbiturate and metabolites    200 Benzodiazepine                  200 Tricyclics and metabolites     300 Opiates and metabolites        300 Cocaine and metabolites        300 THC                            50 Performed at [facility], [address] [place], [place] [id number]   Urinalysis, Routine w reflex microscopic Urine, Clean Catch     Status: Abnormal  Collection Time: 03/11/22  7:31 AM  Result Value Ref Range   Color, Urine YELLOW YELLOW   APPearance HAZY (A) CLEAR   Specific Gravity, Urine 1.020 1.005 - 1.030   pH 5.0 5.0 - 8.0   Glucose, UA NEGATIVE NEGATIVE mg/dL   Hgb urine dipstick NEGATIVE NEGATIVE   Bilirubin Urine NEGATIVE NEGATIVE   Ketones, ur NEGATIVE NEGATIVE mg/dL   Protein, ur NEGATIVE NEGATIVE mg/dL   Nitrite NEGATIVE NEGATIVE   Leukocytes,Ua TRACE (A) NEGATIVE   RBC / HPF 0-5 0 - 5 RBC/hpf   WBC, UA 6-10 0 - 5 WBC/hpf   Bacteria, UA RARE (A) NONE SEEN   Squamous Epithelial / LPF 0-5 0 - 5   Mucus PRESENT     Comment: Performed at [facility], [address], [place], [place] [address]  Resp Panel by RT-PCR (Flu A&B, Covid) Anterior Nasal Swab     Status: None   Collection Time: 03/11/22  7:46 AM   Specimen: Anterior Nasal Swab  Result Value Ref Range   SARS Coronavirus 2 by RT PCR NEGATIVE NEGATIVE    Comment: (NOTE) SARS-CoV-2 target nucleic acids are NOT DETECTED.  The SARS-CoV-2 RNA is generally detectable in upper respiratory specimens during the acute phase of infection. The lowest concentration of SARS-CoV-2 viral copies this assay can detect is 138 copies/mL. A negative result does not preclude SARS-Cov-2 infection and should not be used as the sole basis for treatment or other patient management decisions. A negative result may occur with  improper specimen collection/handling, submission of specimen other than nasopharyngeal swab, presence of viral mutation(s) within the areas targeted by this assay, and inadequate number of viral copies(<138 copies/mL). A  negative result must be combined with clinical observations, patient history, and epidemiological information. The expected result is Negative.  Fact Sheet for Patients:  BloggerCourse.com  Fact Sheet for Healthcare Providers:  SeriousBroker.it  This test is no t yet approved or cleared by the [place] FDA and  has been authorized for detection and/or diagnosis of SARS-CoV-2 by FDA under an Emergency Use Authorization (EUA). This EUA will remain  in effect (meaning this test can be used) for the duration of the COVID-19 declaration under Section 564(b)(1) of the Act, 21 U.S.C.section 360bbb-3(b)(1), unless the authorization is terminated  or revoked sooner.       Influenza A by PCR NEGATIVE NEGATIVE   Influenza B by PCR NEGATIVE NEGATIVE    Comment: (NOTE) The Xpert Xpress SARS-CoV-2/FLU/RSV plus assay is intended as an aid in the diagnosis of influenza from Nasopharyngeal swab specimens and should not be used as a sole basis for treatment. Nasal washings and aspirates are unacceptable for Xpert Xpress SARS-CoV-2/FLU/RSV testing.  Fact Sheet for Patients: BloggerCourse.com  Fact Sheet for Healthcare Providers: SeriousBroker.it  This test is not yet approved or cleared by the [place] FDA and has been authorized for detection and/or diagnosis of SARS-CoV-2 by FDA under an Emergency Use Authorization (EUA). This EUA will remain in effect (meaning this test can be used) for the duration of the COVID-19 declaration under Section 564(b)(1) of the Act, 21 U.S.C. section 360bbb-3(b)(1), unless the authorization is terminated or revoked.  Performed at [facility], [address] [place], [place] [address]   Comprehensive metabolic panel     Status: Abnormal   Collection Time: 03/11/22  7:52 AM  Result Value Ref Range   Sodium 141 135 - 145 mmol/L    Potassium 3.7 3.5 - 5.1  mmol/L   Chloride 106 98 - 111 mmol/L   CO2 26 22 - 32 mmol/L   Glucose, Bld 110 (H) 70 - 99 mg/dL    Comment: Glucose reference range applies only to samples taken after fasting for at least 8 hours.   BUN 9 6 - 20 mg/dL   Creatinine, Ser 3.88 0.61 - 1.24 mg/dL   Calcium 9.8 8.9 - 82.8 mg/dL   Total Protein 7.6 6.5 - 8.1 g/dL   Albumin 3.9 3.5 - 5.0 g/dL   AST 23 15 - 41 U/L   ALT 15 0 - 44 U/L   Alkaline Phosphatase 87 38 - 126 U/L   Total Bilirubin 0.5 0.3 - 1.2 mg/dL   GFR, Estimated >00 >34 mL/min    Comment: (NOTE) Calculated using the CKD-EPI Creatinine Equation (2021)    Anion gap 9 5 - 15    Comment: Performed at [facility], [address] [place], [place] [address]  cbc     Status: None   Collection Time: 03/11/22  7:52 AM  Result Value Ref Range   WBC 8.3 4.0 - 10.5 K/uL   RBC 4.90 4.22 - 5.81 MIL/uL   Hemoglobin 14.6 13.0 - 17.0 g/dL   HCT 50.5 69.7 - 94.8 %   MCV 87.8 80.0 - 100.0 fL   MCH 29.8 26.0 - 34.0 pg   MCHC 34.0 30.0 - 36.0 g/dL   RDW 01.6 55.3 - 74.8 %   Platelets 187 150 - 400 K/uL   nRBC 0.0 0.0 - 0.2 %    Comment: Performed at [facility], [address], [place], [place] [address]  Acetaminophen level     Status: Abnormal   Collection Time: 03/11/22  7:52 AM  Result Value Ref Range   Acetaminophen (Tylenol), Serum <10 (L) 10 - 30 ug/mL    Comment: (NOTE) Therapeutic concentrations vary significantly. A range of 10-30 ug/mL  may be an effective concentration for many patients. However, some  are best treated at concentrations outside of this range. Acetaminophen concentrations >150 ug/mL at 4 hours after ingestion  and >50 ug/mL at 12 hours after ingestion are often associated with  toxic reactions.  Performed at [facility], [address], [place], [place] [address]   Salicylate level     Status: Abnormal   Collection Time: 03/11/22  7:52 AM  Result  Value Ref Range   Salicylate Lvl <7.0 (L) 7.0 - 30.0 mg/dL    Comment: Performed at [facility], [address], [place], [place] [address]  Ethanol     Status: None   Collection Time: 03/11/22  7:52 AM  Result Value Ref Range   Alcohol, Ethyl (B) <10 <10 mg/dL    Comment: (NOTE) Lowest detectable limit for serum alcohol is 10 mg/dL.  For medical purposes only. Performed at [facility] [address], [place], [place] [address]     Current Facility-Administered Medications  Medication Dose Route Frequency Provider Last Rate Last Admin   QUEtiapine (SEROQUEL) tablet 50 mg  50 mg Oral QHS Barrie Dunker B, PA-C       sertraline (ZOLOFT) tablet 50 mg  50 mg Oral Daily Barrie Dunker B, PA-C   50 mg at 03/11/22 0944   traZODone (DESYREL) tablet 50 mg  50 mg Oral QHS Dahlia Byes C, NP       Current Outpatient Medications  Medication Sig Dispense Refill   QUEtiapine (SEROQUEL) 50 MG  tablet Take 1 tablet (50 mg total) by mouth at bedtime. (Patient not taking: Reported on 03/11/2022) 90 tablet 0   sertraline (ZOLOFT) 50 MG tablet Take 1 tablet (50 mg total) by mouth daily. (Patient not taking: Reported on 03/11/2022) 90 tablet 0   traZODone (DESYREL) 100 MG tablet Take 1 tablet (100 mg total) by mouth at bedtime. (Patient not taking: Reported on 03/11/2022) 90 tablet 0    Musculoskeletal: Strength & Muscle Tone: within normal limits Gait & Station: normal Patient leans: Front   Psychiatric Specialty Exam: Presentation  General Appearance: Appropriate for Environment; Fairly Groomed  Eye Contact:Good  Speech:Clear and Coherent; Normal Rate  Speech Volume:Normal  Handedness:Right   Mood and Affect  Mood:Anxious; Depressed; Hopeless  Affect:Congruent   Thought Process  Thought Processes:Coherent; Goal Directed; Linear  Descriptions of Associations:Intact  Orientation:Full (Time, Place and Person)  Thought  Content:Logical  History of Schizophrenia/Schizoaffective disorder:No  Duration of Psychotic Symptoms:N/A  Hallucinations:Hallucinations: None  Ideas of Reference:None  Suicidal Thoughts:Suicidal Thoughts: Yes, Active SI Active Intent and/or Plan: With Intent; With Plan; With Means to Carry Out; With Access to Means  Homicidal Thoughts:Homicidal Thoughts: No   Sensorium  Memory:Immediate Good; Recent Good; Remote Good  Judgment:Poor  Insight:Good   Executive Functions  Concentration:Good  Attention Span:Good  Recall:Good  Fund of Knowledge:Good  Language:Good   Psychomotor Activity  Psychomotor Activity:Psychomotor Activity: Normal   Assets  Assets:Communication Skills; Desire for Improvement; Physical Health    Sleep  Sleep:Sleep: Fair   Physical Exam: Physical Exam Vitals and nursing note reviewed.  Constitutional:      Appearance: Normal appearance.  HENT:     Head: Normocephalic and atraumatic.     Nose: Nose normal.  Cardiovascular:     Rate and Rhythm: Normal rate and regular rhythm.  Pulmonary:     Effort: Pulmonary effort is normal.  Musculoskeletal:        General: Normal range of motion.     Cervical back: Normal range of motion.  Skin:    General: Skin is warm and dry.  Neurological:     General: No focal deficit present.     Mental Status: He is alert and oriented to person, place, and time.    Review of Systems  Constitutional: Negative.   HENT: Negative.    Eyes: Negative.   Respiratory: Negative.    Cardiovascular: Negative.   Gastrointestinal: Negative.   Genitourinary: Negative.   Musculoskeletal: Negative.   Skin: Negative.   Neurological: Negative.   Endo/Heme/Allergies: Negative.   Psychiatric/Behavioral:  Positive for substance abuse and suicidal ideas. The patient is nervous/anxious.    Blood pressure (!) 154/94, pulse 90, temperature 98.2 F (36.8 C), temperature source Oral, resp. rate 18, height 5\' 10"   (1.778 m), weight 104.3 kg, SpO2 98 %. Body mass index is 33 kg/m.  Medical Decision Making: Patient has hx of self harm by cutting self and today remains suicidal with plan to walk into traffic.  Patient is dealing with family member's illness.  Patient meets criteria for admission for medication management and stabilization.  We have resumed home medications and will seek bed placement.  Problem 1: Recurrent Major Depressive disorder, severe without Psychotic features  Problem 2: Substance induced mood disorder  Problem 3: Suicide ideation.  Disposition:  Admit, seek bed placement.  , NP-PMHNP-BC 03/11/2022 12:01 PM

## 2022-03-11 NOTE — ED Notes (Signed)
Pt changed into burgundy scrubs. Pt has 1 personal belonging's bag that includes: shirt, pants, socks, his shoes, and his phone. Pt personal belonging's bag is labeled and placed in cabinets behind nursing station 9-12. Security wanded pt.

## 2022-03-11 NOTE — ED Triage Notes (Signed)
Pt has not been taking his meds, "have a lot of thoughts going through my head" Thoughts are telling him to harm self. Lot of family stress, "I just don't want to be here" 'I really don't" "I don't want to be here without my mom" she has stage 4 lung cancer and mets. She resides in nursing home.

## 2022-03-11 NOTE — ED Notes (Signed)
Pt escorted out of ed w/ ED RN and into safe transport vehicle.  [place], facesheet, pt's belongings, and sticker given to Safe transport driver. No belongings left in ed.  Pt calm and cooperative w/ transport.

## 2022-03-11 NOTE — BH Assessment (Signed)
[facility] Assessment Progress Note   Per Dahlia Byes, NP, this pt requires psychiatric hospitalization at this time.  At 16:06 Morrie Sheldon calls from [facility] .  Pt has been accepted to their facility by Dr Myrtis Ser to their [facility].  Julieanne Cotton, concurs with this disposition, as does the pt who is currently under voluntary status.  EDP Mancel Bale, MD and pt's nurse, Joice Lofts, have been notified, and Amber agrees to call report to [phone].  Pt is to be transported via [employer].  Doylene Canning, [place] Behavioral Health Coordinator [phone]

## 2022-03-11 NOTE — BH Assessment (Signed)
[facility] Assessment Progress Note   Per Dahlia Byes, NP, this pt requires psychiatric hospitalization at this time.  The following facilities have been contacted to seek placement for this pt, with results as noted:   Beds available, information sent, decision pending: [facility]   At capacity: [facility]     If this voluntary pt is accepted to a facility, please discuss disposition with pt to be sure that he agrees to the plan.  If a facility agrees to accept pt and the plan changes in any way please call the facility to inform them of the change.  Final disposition is pending as of this writing.   Doylene Canning, [place] Behavioral Health Coordinator [phone]

## 2022-03-11 NOTE — ED Notes (Signed)
Pt received lunch tray 

## 2022-03-11 NOTE — ED Provider Notes (Signed)
[facility]-EMERGENCY DEPT Provider Note   CSN: [id number] Arrival date & time: 03/11/22  0715     History  No chief complaint on file.   Brian Nolan is a 41 y.o. male.  Patient presents to the hospital complaining of suicidal thoughts and intention.  Patient has not taken his psychiatric medication over the past week.  Patient states he quit taking his medication due to it making him feel groggy and strange while at work.  Patient states that he is having thoughts of harming himself or killing himself.  Patient learned yesterday that his mother has stage IV lung cancer with metastases and is now a DNR.  Patient states that he feels like he needs to die at the same time his mother dies.  He denies hallucinations, homicidal ideations.  Endorses suicidal ideations.  Patient arrived to the hospital with a box cutter in his pocket which he subsequently asked nursing to dispose of.  He showed me scars on his left forearm from where he had previously cut himself.  Patient with past medical history significant for schizophrenia, substance abuse, depression, history of suicidal ideation  HPI     Home Medications Prior to Admission medications   Medication Sig Start Date End Date Taking? Authorizing Provider  QUEtiapine (SEROQUEL) 50 MG tablet Take 1 tablet (50 mg total) by mouth at bedtime. 12/25/21   Nwoko, Tommas Olp, PA  sertraline (ZOLOFT) 50 MG tablet Take 1 tablet (50 mg total) by mouth daily. 12/25/21   Nwoko, Tommas Olp, PA  traZODone (DESYREL) 100 MG tablet Take 1 tablet (100 mg total) by mouth at bedtime. 12/25/21   Meta Hatchet, PA      Allergies    Patient has no known allergies.    Review of Systems   Review of Systems  Psychiatric/Behavioral:  Positive for self-injury and suicidal ideas.     Physical Exam Updated Vital Signs BP (!) 125/94 (BP Location: Right Arm)   Pulse 87   Temp 98.2 F (36.8 C) (Oral)   Resp 20   Ht 5\' 10"  (1.778 m)    Wt 104.3 kg   SpO2 99%   BMI 33.00 kg/m  Physical Exam Vitals and nursing note reviewed.  Constitutional:      General: He is not in acute distress. HENT:     Head: Normocephalic and atraumatic.  Eyes:     Conjunctiva/sclera: Conjunctivae normal.  Cardiovascular:     Rate and Rhythm: Normal rate and regular rhythm.     Pulses: Normal pulses.     Heart sounds: Normal heart sounds.  Pulmonary:     Effort: Pulmonary effort is normal.     Breath sounds: Normal breath sounds.  Musculoskeletal:        General: Normal range of motion.     Cervical back: Normal range of motion.  Skin:    General: Skin is warm and dry.     Comments: Scars on left forearm from previous "cutting"  Neurological:     Mental Status: He is alert.     ED Results / Procedures / Treatments   Labs (all labs ordered are listed, but only abnormal results are displayed) Labs Reviewed  COMPREHENSIVE METABOLIC PANEL - Abnormal; Notable for the following components:      Result Value   Glucose, Bld 110 (*)    All other components within normal limits  RAPID URINE DRUG SCREEN, HOSP PERFORMED - Abnormal; Notable for the following components:  Cocaine POSITIVE (*)    All other components within normal limits  ACETAMINOPHEN LEVEL - Abnormal; Notable for the following components:   Acetaminophen (Tylenol), Serum <10 (*)    All other components within normal limits  SALICYLATE LEVEL - Abnormal; Notable for the following components:   Salicylate Lvl <7.0 (*)    All other components within normal limits  RESP PANEL BY RT-PCR (FLU A&B, COVID) ARPGX2  CBC  ETHANOL  URINALYSIS, ROUTINE W REFLEX MICROSCOPIC    EKG None  Radiology No results found.  Procedures Procedures    Medications Ordered in ED Medications - No data to display  ED Course/ Medical Decision Making/ A&P                           Medical Decision Making Amount and/or Complexity of Data Reviewed Labs: ordered.   Patient  presents with a chief complaint of suicidal ideation with intent to harm himself.  Patient is voluntary at this time.  I ordered and reviewed lab results.  Pertinent lab results include grossly normal CBC, CMP, ethanol, salicylate, acetaminophen levels.  Negative coronavirus/influenza test.  UDS positive for cocaine.  Urinalysis pending  I ordered an EKG which showed an underlying rhythm of normal sinus rhythm.  At this time the patient is medically clear for psychiatric evaluation.  Consult placed.         Final Clinical Impression(s) / ED Diagnoses Final diagnoses:  Suicidal ideation    Rx / DC Orders ED Discharge Orders     None         Pamala Duffel 03/11/22 4967    Sloan Leiter, DO 03/13/22 1915

## 2022-11-25 IMAGING — CR DG CHEST 2V
2 series · 2 of 2 positions shown · non-contrast
Comparison: May 31, 2018

CLINICAL DATA: Chest pain.

EXAM:
CHEST - 2 VIEW

[chest pa]
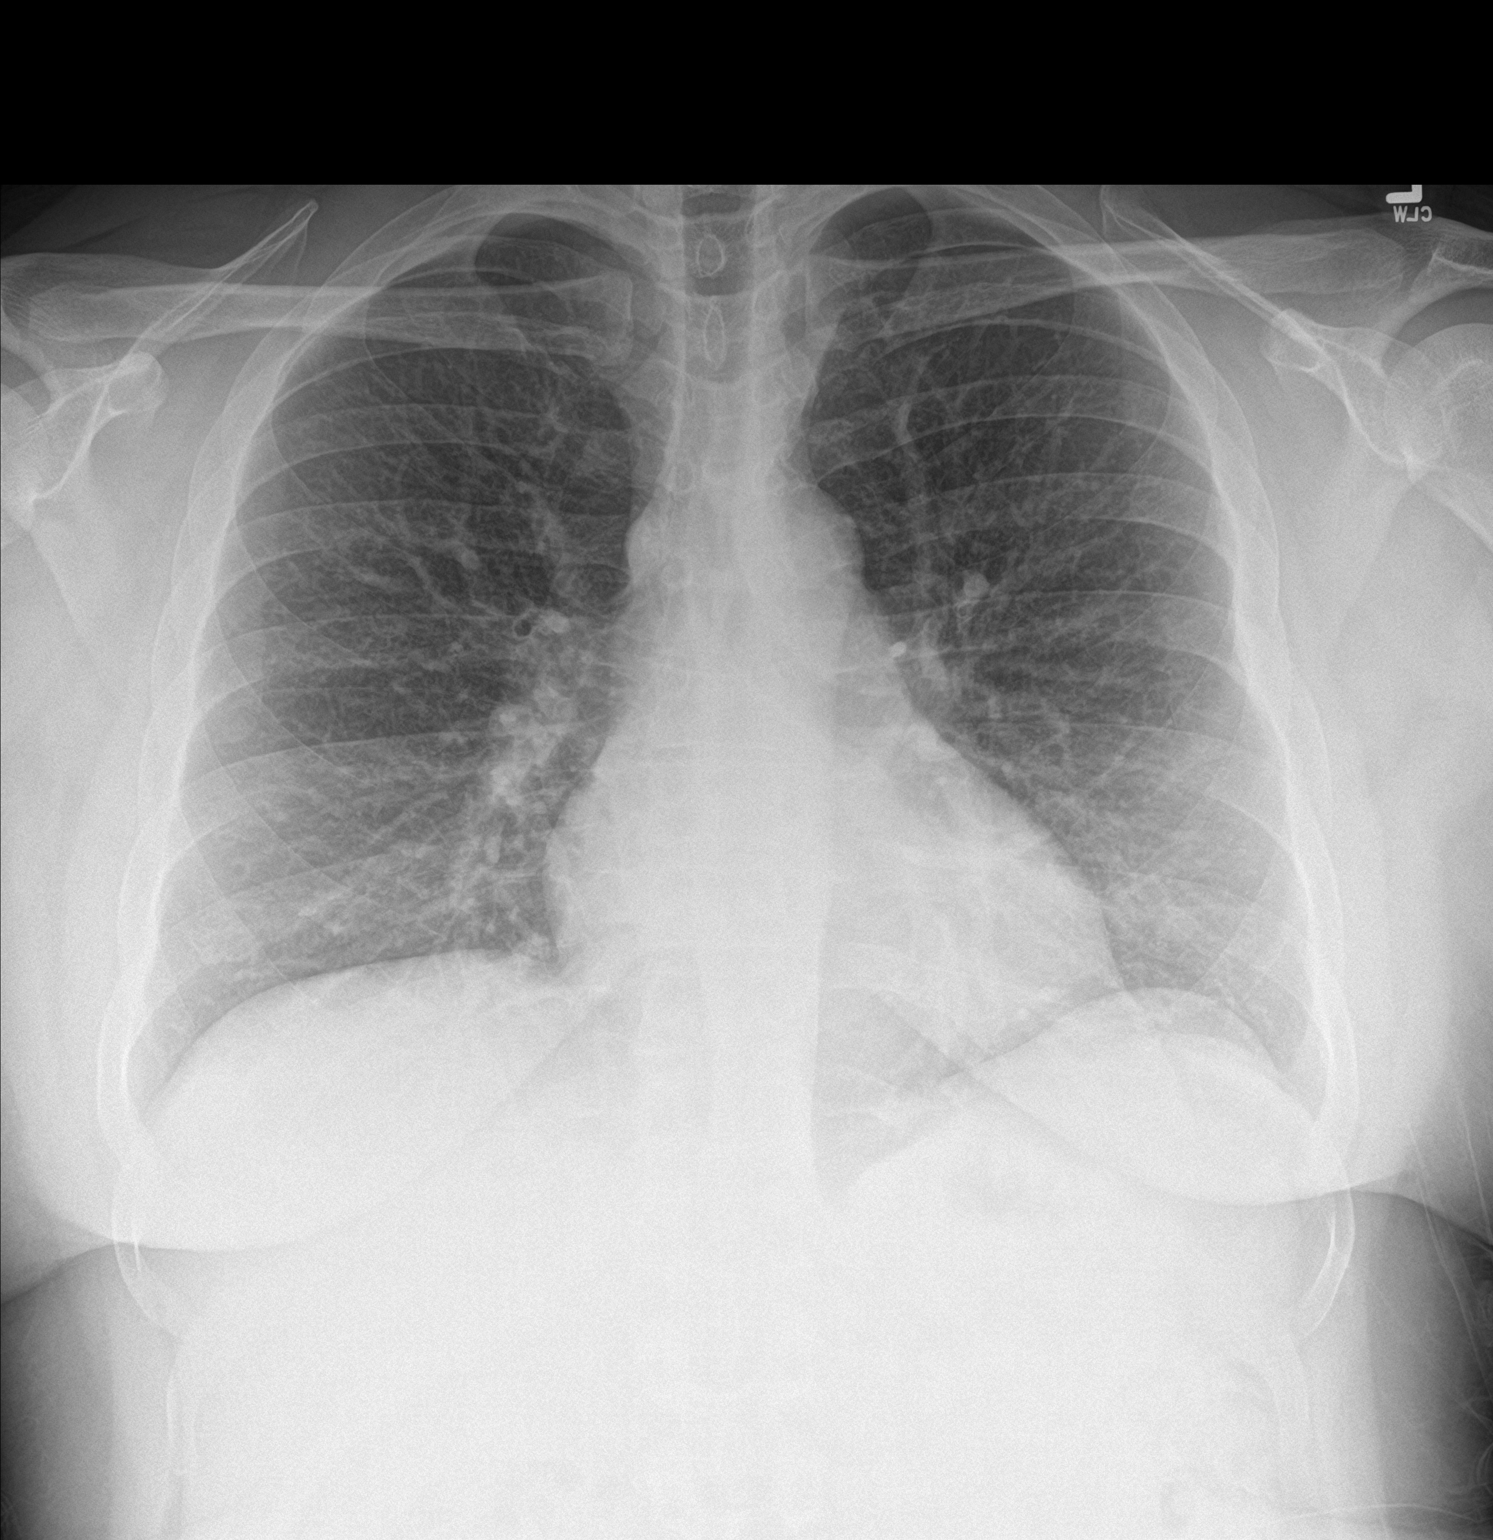

[chest lat]
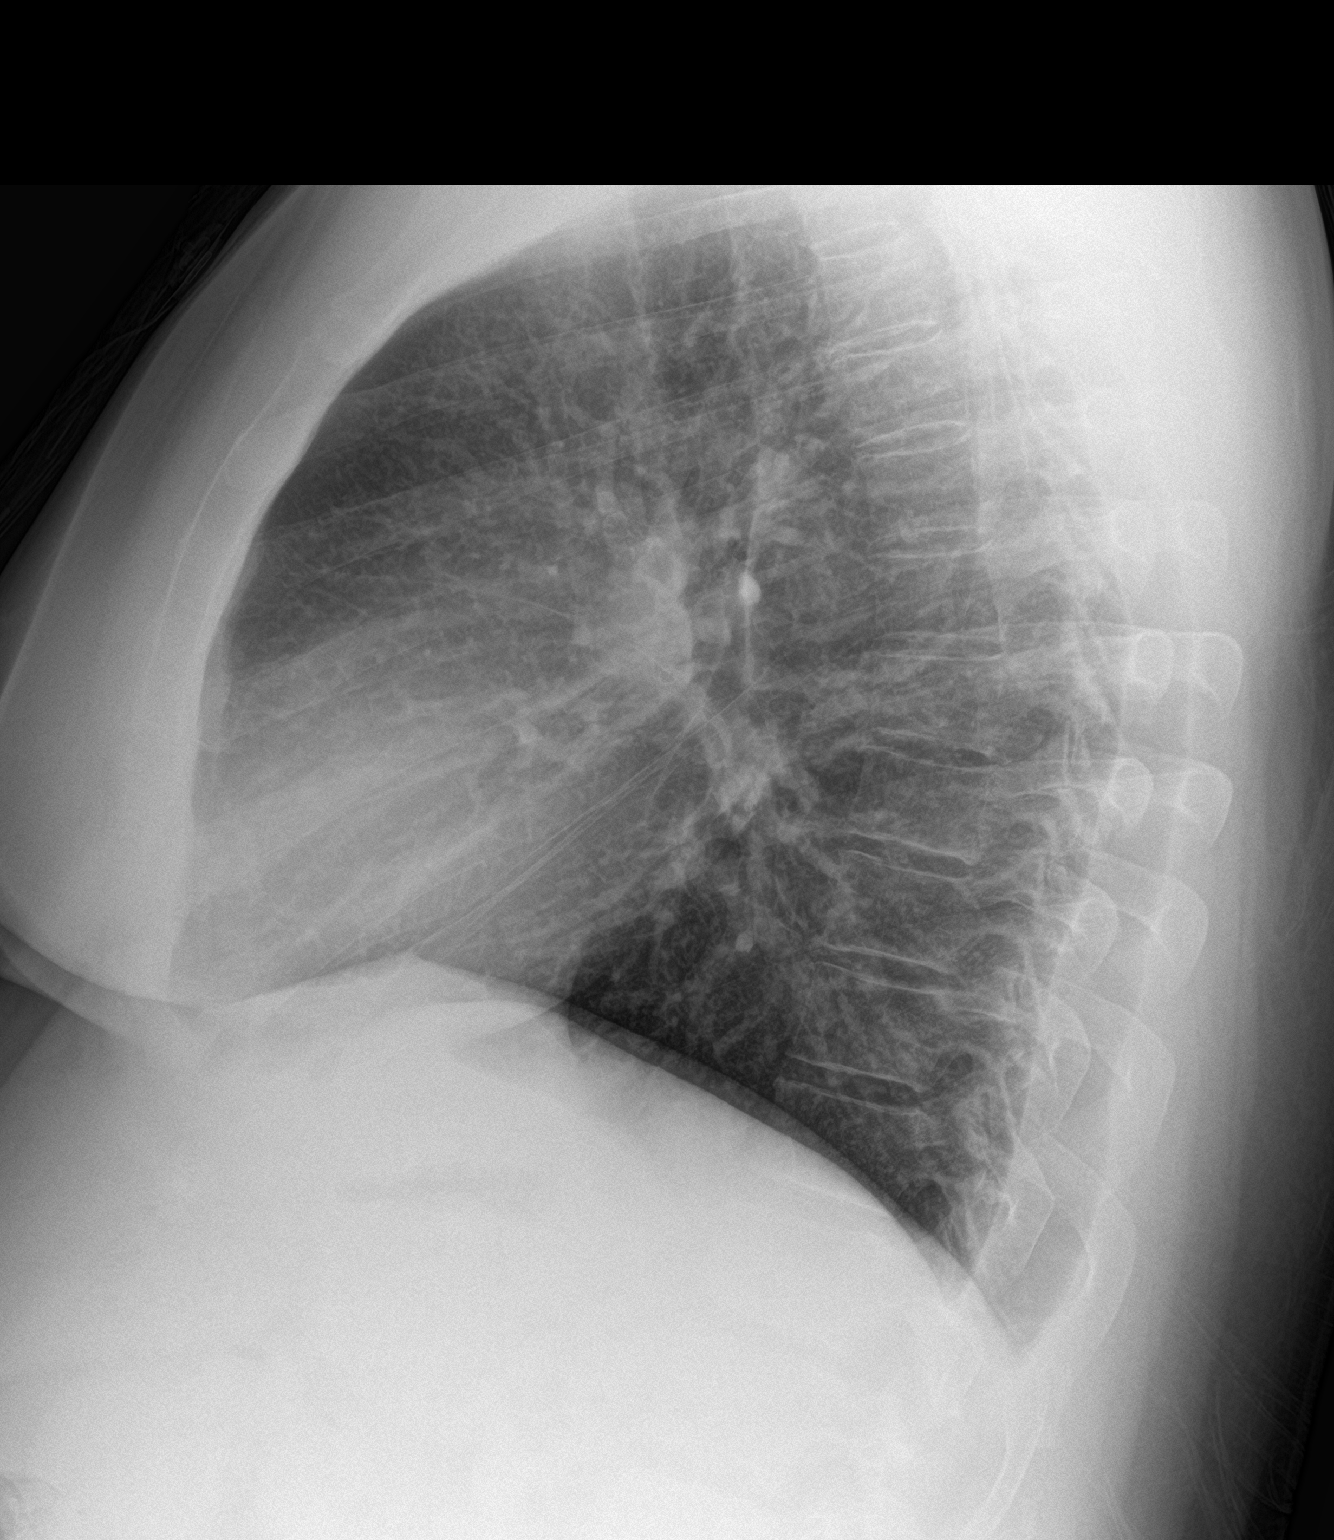

[2 of 2 positions shown; findings below may reference images not displayed]

FINDINGS: The heart size and mediastinal contours are within normal limits.
Both lungs are clear. The visualized skeletal structures are
unremarkable.
IMPRESSION: No active cardiopulmonary disease.

## 2023-05-31 ENCOUNTER — Ambulatory Visit (HOSPITAL_COMMUNITY)
Admission: EM | Admit: 2023-05-31 | Discharge: 2023-06-01 | Disposition: A | Discharge status: Other Acute Inpatient Hospital | Payer: No Payment, Other | Attending: Psychiatry | Admitting: Psychiatry

## 2023-05-31 DIAGNOSIS — F1910 Other psychoactive substance abuse, uncomplicated: Secondary | ICD-10-CM | POA: Diagnosis not present

## 2023-05-31 DIAGNOSIS — F411 Generalized anxiety disorder: Secondary | ICD-10-CM | POA: Diagnosis not present

## 2023-05-31 DIAGNOSIS — Z62811 Personal history of psychological abuse in childhood: Secondary | ICD-10-CM | POA: Insufficient documentation

## 2023-05-31 DIAGNOSIS — Z634 Disappearance and death of family member: Secondary | ICD-10-CM | POA: Insufficient documentation

## 2023-05-31 DIAGNOSIS — F332 Major depressive disorder, recurrent severe without psychotic features: Secondary | ICD-10-CM | POA: Insufficient documentation

## 2023-05-31 DIAGNOSIS — R45851 Suicidal ideations: Secondary | ICD-10-CM | POA: Insufficient documentation

## 2023-05-31 DIAGNOSIS — Z79899 Other long term (current) drug therapy: Secondary | ICD-10-CM | POA: Insufficient documentation

## 2023-05-31 DIAGNOSIS — Z630 Problems in relationship with spouse or partner: Secondary | ICD-10-CM | POA: Insufficient documentation

## 2023-05-31 LAB — POCT URINE DRUG SCREEN - MANUAL ENTRY (I-SCREEN)
POC Amphetamine UR: POSITIVE — AB
POC Buprenorphine (BUP): NOT DETECTED
POC Cocaine UR: POSITIVE — AB
POC Marijuana UR: NOT DETECTED
POC Methadone UR: NOT DETECTED
POC Methamphetamine UR: NOT DETECTED
POC Morphine: NOT DETECTED
POC Oxazepam (BZO): POSITIVE — AB
POC Oxycodone UR: NOT DETECTED
POC Secobarbital (BAR): NOT DETECTED

## 2023-05-31 LAB — CBC WITH DIFFERENTIAL/PLATELET
Abs Immature Granulocytes: 0.07 10*3/uL (ref 0.00–0.07)
Basophils Absolute: 0.1 10*3/uL (ref 0.0–0.1)
Basophils Relative: 1 %
Eosinophils Absolute: 0.3 10*3/uL (ref 0.0–0.5)
Eosinophils Relative: 2 %
HCT: 45.9 % (ref 39.0–52.0)
Hemoglobin: 15.8 g/dL (ref 13.0–17.0)
Immature Granulocytes: 1 %
Lymphocytes Relative: 35 %
Lymphs Abs: 4.5 10*3/uL — ABNORMAL HIGH (ref 0.7–4.0)
MCH: 29.7 pg (ref 26.0–34.0)
MCHC: 34.4 g/dL (ref 30.0–36.0)
MCV: 86.3 fL (ref 80.0–100.0)
Monocytes Absolute: 1 10*3/uL (ref 0.1–1.0)
Monocytes Relative: 7 %
Neutro Abs: 7.1 10*3/uL (ref 1.7–7.7)
Neutrophils Relative %: 54 %
Platelets: 223 10*3/uL (ref 150–400)
RBC: 5.32 MIL/uL (ref 4.22–5.81)
RDW: 13.7 % (ref 11.5–15.5)
WBC: 12.9 10*3/uL — ABNORMAL HIGH (ref 4.0–10.5)
nRBC: 0 % (ref 0.0–0.2)

## 2023-05-31 LAB — COMPREHENSIVE METABOLIC PANEL
ALT: 24 U/L (ref 0–44)
AST: 19 U/L (ref 15–41)
Albumin: 3.2 g/dL — ABNORMAL LOW (ref 3.5–5.0)
Alkaline Phosphatase: 71 U/L (ref 38–126)
Anion gap: 12 (ref 5–15)
BUN: 14 mg/dL (ref 6–20)
CO2: 25 mmol/L (ref 22–32)
Calcium: 8.6 mg/dL — ABNORMAL LOW (ref 8.9–10.3)
Chloride: 101 mmol/L (ref 98–111)
Creatinine, Ser: 1.02 mg/dL (ref 0.61–1.24)
GFR, Estimated: >60 mL/min (ref >60)
Glucose, Bld: 97 mg/dL (ref 70–99)
Potassium: 3.7 mmol/L (ref 3.5–5.1)
Sodium: 138 mmol/L (ref 135–145)
Total Bilirubin: 0.2 mg/dL — ABNORMAL LOW (ref 0.3–1.2)
Total Protein: 5.5 g/dL — ABNORMAL LOW (ref 6.5–8.1)

## 2023-05-31 LAB — HEMOGLOBIN A1C
Hgb A1c MFr Bld: 5.7 % — ABNORMAL HIGH (ref 4.8–5.6)
Mean Plasma Glucose: 116.89 mg/dL

## 2023-05-31 LAB — ETHANOL: Alcohol, Ethyl (B): <10 mg/dL (ref <10)

## 2023-05-31 LAB — MAGNESIUM: Magnesium: 2.3 mg/dL (ref 1.7–2.4)

## 2023-05-31 LAB — TSH: TSH: 0.982 u[IU]/mL (ref 0.350–4.500)

## 2023-05-31 MED ORDER — QUETIAPINE FUMARATE 50 MG PO TABS
50.0000 mg | ORAL_TABLET | Freq: Every day | ORAL | Status: DC
  Administered 2023-05-31: 50 mg via ORAL
  Filled 2023-05-31: qty 1

## 2023-05-31 MED ORDER — HYDROXYZINE HCL 25 MG PO TABS: 25.0000 mg | ORAL_TABLET | Freq: Three times a day (TID) | ORAL | Status: DC | PRN

## 2023-05-31 MED ORDER — TRAZODONE HCL 50 MG PO TABS
50.0000 mg | ORAL_TABLET | Freq: Every evening | ORAL | Status: DC | PRN
  Administered 2023-05-31: 50 mg via ORAL
  Filled 2023-05-31: qty 1

## 2023-05-31 MED ORDER — MAGNESIUM HYDROXIDE 400 MG/5ML PO SUSP: 30.0000 mL | Freq: Every day | ORAL | Status: DC | PRN

## 2023-05-31 MED ORDER — ACETAMINOPHEN 325 MG PO TABS: 650.0000 mg | ORAL_TABLET | Freq: Four times a day (QID) | ORAL | Status: DC | PRN

## 2023-05-31 MED ORDER — ALUM & MAG HYDROXIDE-SIMETH 200-200-20 MG/5ML PO SUSP: 30.0000 mL | ORAL | Status: DC | PRN

## 2023-05-31 MED ORDER — SERTRALINE HCL 50 MG PO TABS
50.0000 mg | ORAL_TABLET | Freq: Every day | ORAL | Status: DC
  Administered 2023-05-31 – 2023-06-01 (×2): 50 mg via ORAL
  Filled 2023-05-31 (×2): qty 1
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
## 2023-05-31 NOTE — ED Notes (Signed)
Rn notified provider that patients blood draw was unsuccessful.and that rn will notify on coming shift to try.

## 2023-05-31 NOTE — ED Notes (Signed)
Pt  admitted to obs. Reports passive   SI/ deniesHI/AVH. Calm, cooperative throughout interview process. Skin assessment completed. Oriented to unit. Meal and drink offered. At currrent, pt continue to endorse passive  SI/ denies HI/AVH. Pt verbally contract for safety. Will monitor for safety.

## 2023-05-31 NOTE — ED Notes (Signed)
Patient in milieu. Environment is secured. Will continue to monitor for safety. 
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
## 2023-06-01 ENCOUNTER — Other Ambulatory Visit: Payer: Self-pay

## 2023-06-01 ENCOUNTER — Encounter (HOSPITAL_COMMUNITY): Payer: Self-pay | Admitting: Registered Nurse

## 2023-06-01 ENCOUNTER — Encounter (HOSPITAL_COMMUNITY): Payer: Self-pay | Admitting: Psychiatry

## 2023-06-01 ENCOUNTER — Inpatient Hospital Stay (HOSPITAL_COMMUNITY)
Admission: AD | Admit: 2023-06-01 | Discharge: 2023-06-08 | DRG: 885 | Disposition: A | Discharge status: Home / Self Care | Payer: No Typology Code available for payment source | Source: Intra-hospital | Attending: Psychiatry | Admitting: Psychiatry

## 2023-06-01 DIAGNOSIS — Z634 Disappearance and death of family member: Secondary | ICD-10-CM

## 2023-06-01 DIAGNOSIS — Z79899 Other long term (current) drug therapy: Secondary | ICD-10-CM | POA: Diagnosis not present

## 2023-06-01 DIAGNOSIS — Z9151 Personal history of suicidal behavior: Secondary | ICD-10-CM | POA: Diagnosis not present

## 2023-06-01 DIAGNOSIS — F1010 Alcohol abuse, uncomplicated: Secondary | ICD-10-CM | POA: Diagnosis present

## 2023-06-01 DIAGNOSIS — F1910 Other psychoactive substance abuse, uncomplicated: Secondary | ICD-10-CM | POA: Insufficient documentation

## 2023-06-01 DIAGNOSIS — Z801 Family history of malignant neoplasm of trachea, bronchus and lung: Secondary | ICD-10-CM

## 2023-06-01 DIAGNOSIS — R12 Heartburn: Secondary | ICD-10-CM | POA: Diagnosis present

## 2023-06-01 DIAGNOSIS — Z833 Family history of diabetes mellitus: Secondary | ICD-10-CM | POA: Diagnosis not present

## 2023-06-01 DIAGNOSIS — F332 Major depressive disorder, recurrent severe without psychotic features: Secondary | ICD-10-CM | POA: Diagnosis present

## 2023-06-01 DIAGNOSIS — F1410 Cocaine abuse, uncomplicated: Secondary | ICD-10-CM | POA: Diagnosis present

## 2023-06-01 DIAGNOSIS — F411 Generalized anxiety disorder: Secondary | ICD-10-CM | POA: Diagnosis present

## 2023-06-01 DIAGNOSIS — F1510 Other stimulant abuse, uncomplicated: Secondary | ICD-10-CM | POA: Diagnosis present

## 2023-06-01 DIAGNOSIS — Z9152 Personal history of nonsuicidal self-harm: Secondary | ICD-10-CM | POA: Diagnosis not present

## 2023-06-01 DIAGNOSIS — Z818 Family history of other mental and behavioral disorders: Secondary | ICD-10-CM

## 2023-06-01 DIAGNOSIS — R110 Nausea: Secondary | ICD-10-CM | POA: Diagnosis present

## 2023-06-01 DIAGNOSIS — F17210 Nicotine dependence, cigarettes, uncomplicated: Secondary | ICD-10-CM | POA: Diagnosis present

## 2023-06-01 DIAGNOSIS — R45851 Suicidal ideations: Secondary | ICD-10-CM | POA: Diagnosis present

## 2023-06-01 DIAGNOSIS — R197 Diarrhea, unspecified: Secondary | ICD-10-CM | POA: Diagnosis present

## 2023-06-01 HISTORY — DX: Personal history of other diseases of the digestive system: Z87.19

## 2023-06-01 MED ORDER — TRAZODONE HCL 50 MG PO TABS: 50.0000 mg | ORAL_TABLET | Freq: Every evening | ORAL | Status: DC | PRN

## 2023-06-01 MED ORDER — TRAZODONE HCL 50 MG PO TABS
50.0000 mg | ORAL_TABLET | Freq: Every evening | ORAL | Status: DC | PRN
  Administered 2023-06-01 – 2023-06-03 (×3): 50 mg via ORAL
  Filled 2023-06-01 (×3): qty 1

## 2023-06-01 MED ORDER — MAGNESIUM HYDROXIDE 400 MG/5ML PO SUSP: 30.0000 mL | Freq: Every day | ORAL | Status: DC | PRN

## 2023-06-01 MED ORDER — ALUM & MAG HYDROXIDE-SIMETH 200-200-20 MG/5ML PO SUSP
30.0000 mL | ORAL | Status: DC | PRN
  Administered 2023-06-02 – 2023-06-06 (×7): 30 mL via ORAL
  Filled 2023-06-01 (×7): qty 30

## 2023-06-01 MED ORDER — ONDANSETRON 4 MG PO TBDP: 4.0000 mg | ORAL_TABLET | Freq: Four times a day (QID) | ORAL | Status: AC | PRN

## 2023-06-01 MED ORDER — QUETIAPINE FUMARATE 50 MG PO TABS
50.0000 mg | ORAL_TABLET | Freq: Every day | ORAL | Status: DC
  Administered 2023-06-01 – 2023-06-07 (×7): 50 mg via ORAL
  Filled 2023-06-01: qty 7
  Filled 2023-06-01 (×8): qty 1

## 2023-06-01 MED ORDER — DIPHENHYDRAMINE HCL 50 MG/ML IJ SOLN: 50.0000 mg | Freq: Three times a day (TID) | INTRAMUSCULAR | Status: DC | PRN

## 2023-06-01 MED ORDER — HALOPERIDOL 5 MG PO TABS: 5.0000 mg | ORAL_TABLET | Freq: Three times a day (TID) | ORAL | Status: DC | PRN

## 2023-06-01 MED ORDER — SERTRALINE HCL 50 MG PO TABS
50.0000 mg | ORAL_TABLET | Freq: Every day | ORAL | Status: DC
  Administered 2023-06-02 – 2023-06-03 (×2): 50 mg via ORAL
  Filled 2023-06-01 (×5): qty 1

## 2023-06-01 MED ORDER — ADULT MULTIVITAMIN W/MINERALS CH
1.0000 | ORAL_TABLET | Freq: Every day | ORAL | Status: DC
  Administered 2023-06-01 – 2023-06-08 (×8): 1 via ORAL
  Filled 2023-06-01 (×11): qty 1

## 2023-06-01 MED ORDER — HYDROXYZINE HCL 25 MG PO TABS
25.0000 mg | ORAL_TABLET | Freq: Three times a day (TID) | ORAL | Status: DC | PRN
  Administered 2023-06-01 – 2023-06-07 (×6): 25 mg via ORAL
  Filled 2023-06-01 (×3): qty 1
  Filled 2023-06-01: qty 10
  Filled 2023-06-01 (×3): qty 1

## 2023-06-01 MED ORDER — LOPERAMIDE HCL 2 MG PO CAPS: 2.0000 mg | ORAL_CAPSULE | ORAL | Status: AC | PRN

## 2023-06-01 MED ORDER — NICOTINE 14 MG/24HR TD PT24: 14.0000 mg | MEDICATED_PATCH | Freq: Every day | TRANSDERMAL | Status: DC | PRN

## 2023-06-01 MED ORDER — LORAZEPAM 2 MG/ML IJ SOLN: 2.0000 mg | Freq: Three times a day (TID) | INTRAMUSCULAR | Status: DC | PRN

## 2023-06-01 MED ORDER — DIPHENHYDRAMINE HCL 25 MG PO CAPS: 50.0000 mg | ORAL_CAPSULE | Freq: Three times a day (TID) | ORAL | Status: DC | PRN

## 2023-06-01 MED ORDER — ACETAMINOPHEN 325 MG PO TABS: 650.0000 mg | ORAL_TABLET | Freq: Four times a day (QID) | ORAL | Status: DC | PRN

## 2023-06-01 MED ORDER — LORAZEPAM 1 MG PO TABS: 1.0000 mg | ORAL_TABLET | Freq: Four times a day (QID) | ORAL | Status: AC | PRN

## 2023-06-01 MED ORDER — VITAMIN B-1 100 MG PO TABS
100.0000 mg | ORAL_TABLET | Freq: Every day | ORAL | Status: DC
  Administered 2023-06-02 – 2023-06-08 (×7): 100 mg via ORAL
  Filled 2023-06-01 (×9): qty 1

## 2023-06-01 MED ORDER — LORAZEPAM 1 MG PO TABS: 2.0000 mg | ORAL_TABLET | Freq: Three times a day (TID) | ORAL | Status: DC | PRN

## 2023-06-01 MED ORDER — HALOPERIDOL LACTATE 5 MG/ML IJ SOLN: 5.0000 mg | Freq: Three times a day (TID) | INTRAMUSCULAR | Status: DC | PRN

## 2023-06-01 NOTE — Tx Team (Signed)
Initial Treatment Plan 06/01/2023 2:27 PM Brian Nolan OAC:[id number]    PATIENT STRESSORS: Financial difficulties   Marital or family conflict   Substance abuse     PATIENT STRENGTHS: Ability for insight  Active sense of humor  Average or above average intelligence  Capable of independent living  Communication skills    PATIENT IDENTIFIED PROBLEMS:     Depression Toxic relationship Cocaine and alcohol use                  DISCHARGE CRITERIA:  Ability to meet basic life and health needs Adequate post-discharge living arrangements Medical problems require only outpatient monitoring Need for constant or close observation no longer present Reduction of life-threatening or endangering symptoms to within safe limits Safe-care adequate arrangements made  PRELIMINARY DISCHARGE PLAN: Attend PHP/IOP Attend 12-step recovery group Participate in family therapy Placement in alternative living arrangements Return to previous living arrangement  PATIENT/FAMILY INVOLVEMENT: This treatment plan has been presented to and reviewed with the patient, Brian Nolan, and/or family member, .  The patient and family have been given the opportunity to ask questions and make suggestions.  Malva Limes, RN 06/01/2023, 2:27 PM
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
## 2023-06-01 NOTE — ED Notes (Signed)
Safe transport called 

## 2023-06-01 NOTE — ED Notes (Signed)
Patient  sleeping in no acute stress. RR even and unlabored .Environment secured .Will continue to monitor for safely. 

## 2023-06-01 NOTE — ED Notes (Signed)
Pt observed/assessed in recliner sleeping. RR even and unlabored, appearing in no noted distress. Environmental check complete, will continue to monitor for safety 
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
## 2023-06-01 NOTE — ED Notes (Signed)
Patient alert and oriented x 3. Denies SI/HI/AVH. Denies intent or plan to harm self or others. Routine conducted according to faculty protocol. Encourage patient to notify staff with any needs or concerns. Patient verbalized agreement and understanding. Will continue to monitor for safety. 
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
## 2023-06-01 NOTE — ED Notes (Signed)
Patient A&O x 4, ambulatory. Patient discharged in no acute distress. Patient denied SI/HI, A/VH upon discharge.   Pt belongings returned to patient from locker #   32 intact. Patient escorted to lobby via staff for transport to destination. Safety maintained. [facility]ansported to [facility] via safe transport

## 2023-06-01 NOTE — Group Note (Signed)
Recreation Therapy Group Note   Group Topic:Stress Management  Group Date: 06/01/2023 Start Time: 4401 End Time: 0956 Facilitators: Chelsea Pedretti-McCall, LRT,CTRS Location: [address] Dayroom   Group Topic: Stress Management  Goal Area(s) Addresses:  Patient will identify positive stress management techniques. Patient will identify benefits of using stress management post d/c.  Group Description: Meditation. LRT played a meditation from the Calm app that focused on taking in the characteristics of a mountain. It encouraged participates to envision how the mountain stands tall and endures whatever it's confronted with (ie. Changing weather, different seasons, time of day) and encouraged patients to take on that same attitude when they are faced with the challenges of life.   Education:  Stress Management, Discharge Planning.   Education Outcome: Acknowledges Education   Affect/Mood: N/A   Participation Level: Did not attend    Clinical Observations/Individualized Feedback:     Plan: Continue to engage patient in RT group sessions 2-3x/week.   Johnay Mano-McCall, LRT,CTRS 06/01/2023 12:21 PM

## 2023-06-01 NOTE — Group Note (Signed)
Date:  06/01/2023 Time:  11:35 PM  Group Topic/Focus:  Recovery Goals:   The focus of this group is to identify appropriate goals for recovery and establish a plan to achieve them.    Participation Level:  Active  Participation Quality:  Appropriate  Affect:  Appropriate  Cognitive:  Appropriate  Insight: Appropriate  Engagement in Group:  Supportive  Modes of Intervention:  Support  Additional Comments:    Brian Nolan 06/01/2023, 11:35 PM

## 2023-06-01 NOTE — ED Notes (Signed)
Rn gave report to david  Rn at 945 am  @ [facility]   states to send the patient at 1030 am

## 2023-06-01 NOTE — [facility] Group Notes (Signed)

## 2023-06-01 NOTE — ED Notes (Signed)
Pt is currently sleeping, no distress noted, environmental check complete, will continue to monitor patient for safety.  

## 2023-06-01 NOTE — Plan of Care (Signed)

## 2023-06-02 DIAGNOSIS — F332 Major depressive disorder, recurrent severe without psychotic features: Secondary | ICD-10-CM | POA: Diagnosis not present

## 2023-06-02 DIAGNOSIS — F411 Generalized anxiety disorder: Secondary | ICD-10-CM | POA: Insufficient documentation

## 2023-06-02 LAB — LIPID PANEL
Cholesterol: 185 mg/dL (ref 0–200)
HDL: 41 mg/dL (ref >40)
LDL Cholesterol: UNDETERMINED mg/dL (ref 0–99)
Total CHOL/HDL Ratio: 4.5 {ratio}
Triglycerides: 443 mg/dL — ABNORMAL HIGH (ref <150)
VLDL: UNDETERMINED mg/dL (ref 0–40)

## 2023-06-02 LAB — LDL CHOLESTEROL, DIRECT: Direct LDL: 73 mg/dL (ref 0–99)

## 2023-06-02 NOTE — Group Note (Signed)
Date:  06/02/2023 Time:  2:04 PM  Group Topic/Focus:  Goals Group:   The focus of this group is to help patients establish daily goals to achieve during treatment and discuss how the patient can incorporate goal setting into their daily lives to aide in recovery. Orientation:   The focus of this group is to educate the patient on the purpose and policies of crisis stabilization and provide a format to answer questions about their admission.  The group details unit policies and expectations of patients while admitted.    Participation Level:  Did Not Attend  Participation Quality:   n/a  Affect:   n/a  Cognitive:   n/a  Insight: None  Engagement in Group:   n/a  Modes of Intervention:   n/a  Additional Comments:   Pt did not attend.  Brian Nolan 06/02/2023, 2:04 PM

## 2023-06-02 NOTE — Progress Notes (Signed)
   06/02/23 2237  Psych Admission Type (Psych Patients Only)  Admission Status Voluntary  Psychosocial Assessment  Patient Complaints Anxiety  Eye Contact Brief  Facial Expression Anxious  Affect Anxious;Depressed  Speech Logical/coherent  Interaction Assertive  Motor Activity Slow  Appearance/Hygiene Unremarkable  Behavior Characteristics Cooperative  Mood Anxious  Thought Process  Coherency WDL  Content WDL  Delusions WDL  Perception WDL  Hallucination None reported or observed  Judgment Impaired  Confusion WDL  Danger to Self  Current suicidal ideation? Denies  Description of Suicide Plan no plan  Self-Injurious Behavior No self-injurious ideation or behavior indicators observed or expressed   Agreement Not to Harm Self Yes  Description of Agreement verbal  Danger to Others  Danger to Others None reported or observed  Danger to Others Abnormal  Harmful Behavior to others No threats or harm toward other people

## 2023-06-02 NOTE — Plan of Care (Signed)
  Problem: Education: Goal: Knowledge of [facility] Education information/materials will improve Outcome: Progressing   Problem: Education: Goal: Emotional status will improve Outcome: Progressing   Problem: Education: Goal: Mental status will improve Outcome: Progressing   Problem: Activity: Goal: Sleeping patterns will improve Outcome: Progressing   Problem: Physical Regulation: Goal: Ability to maintain clinical measurements within normal limits will improve Outcome: Progressing   Problem: Safety: Goal: Periods of time without injury will increase Outcome: Progressing

## 2023-06-02 NOTE — Plan of Care (Signed)
  Problem: Education: Goal: Emotional status will improve Outcome: Progressing Goal: Mental status will improve Outcome: Progressing   Problem: Activity: Goal: Interest or engagement in activities will improve Outcome: Progressing Goal: Sleeping patterns will improve Outcome: Progressing

## 2023-06-02 NOTE — Group Note (Unsigned)
Date:  06/02/2023 Time:  5:19 PM  Group Topic/Focus:  Goals Group:   The focus of this group is to help patients establish daily goals to achieve during treatment and discuss how the patient can incorporate goal setting into their daily lives to aide in recovery. Orientation:   The focus of this group is to educate the patient on the purpose and policies of crisis stabilization and provide a format to answer questions about their admission.  The group details unit policies and expectations of patients while admitted.     Participation Level:  {[facility] PARTICIPATION UEAVW:09811}  Participation Quality:  {[facility] PARTICIPATION QUALITY:22265}  Affect:  {[facility] AFFECT:22266}  Cognitive:  {[facility] COGNITIVE:22267}  Insight: {[facility] Insight2:20797}  Engagement in Group:  {[facility] ENGAGEMENT IN BJYNW:29562}  Modes of Intervention:  {[facility] MODES OF INTERVENTION:22269}  Additional Comments:  ***  Brian Nolan M [name] 06/02/2023, 5:19 PM
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
## 2023-06-02 NOTE — Progress Notes (Signed)
   06/02/23 0601  15 Minute Checks  Location Bedroom  Visual Appearance Calm  Behavior Sleeping  Sleep (Behavioral Health Patients Only)  Calculate sleep? (Click Yes once per 24 hr at 0600 safety check) Yes  Documented sleep last 24 hours 7.5
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
## 2023-06-02 NOTE — Progress Notes (Signed)
   06/02/23 1000  Psych Admission Type (Psych Patients Only)  Admission Status Voluntary  Psychosocial Assessment  Patient Complaints Anxiety;Depression  Eye Contact Brief  Facial Expression Anxious;Sad  Affect Anxious;Depressed  Speech Logical/coherent  Interaction Assertive  Motor Activity Slow  Appearance/Hygiene Unremarkable  Behavior Characteristics Cooperative  Mood Depressed;Anxious  Thought Process  Coherency WDL  Content WDL  Delusions WDL  Perception WDL  Hallucination None reported or observed  Judgment Impaired  Confusion WDL  Danger to Self  Current suicidal ideation? Denies  Description of Suicide Plan No plan  Self-Injurious Behavior No self-injurious ideation or behavior indicators observed or expressed   Agreement Not to Harm Self Yes  Description of Agreement Verbal  Danger to Others  Danger to Others None reported or observed  Danger to Others Abnormal  Harmful Behavior to others No threats or harm toward other people

## 2023-06-02 NOTE — Group Note (Signed)
Recreation Therapy Group Note   Group Topic:Animal Assisted Therapy   Group Date: 06/02/2023 Start Time: 0946 End Time: 1030 Facilitators: Harbert Fitterer-McCall, LRT,CTRS Location: [address] Dayroom   Animal-Assisted Activity (AAA) Program Checklist/Progress Notes Patient Eligibility Criteria Checklist & Daily Group note for Rec Tx Intervention  AAA/T Program Assumption of Risk Form signed by Patient/ or Parent Legal Guardian Yes  Patient understands his/her participation is voluntary Yes  Education: Charity fundraiser, Appropriate Animal Interaction   Education Outcome: Acknowledges education.    Affect/Mood: N/A   Participation Level: Did not attend    Clinical Observations/Individualized Feedback:    Plan: Continue to engage patient in RT group sessions 2-3x/week.   [name], LRT,CTRS 06/02/2023 1:11 PM

## 2023-06-02 NOTE — Group Note (Signed)
LCSW Group Therapy Note   Group Date: 06/02/2023 Start Time: 1100 End Time: 1200    Type of Therapy and Topic:  Group Therapy - Coping Skills For Anxiety and Depression  Participation Level:  Did Not Attend   Description of Group The focus of this group was to determine what healthy coping techniques would be helpful for group members in coping with anxiety and depression in their daily lives. The group began with patients introducing themselves and revealing one healthy and one unhealthy way they have coped with anxiety or depression in the past. Patients were guided through different techniques for coping with anxiety in a healthy way, including deep breathing, progressive muscle relaxation, challenging irrational thoughts, and mental imagery. Patients were then guided though different techniques for coping with depression in a healthy way, including behavioral activation, increasing social supports, focusing on positive experiences, and mindfulness. Differences between healthy and unhealthy coping techniques were pointed out when brought up by group members. Patients were asked to identify 2-3 healthy coping skills they would like to learn to use more effectively after being guided through these different techniques.These were explained, samples demonstrated, and resources shared for how to learn more at discharge.  Therapeutic Goals Patients learned that coping is what human beings do to deal with various situations in their lives. Patients learned various healthy coping techniques for anxiety and depression Patients determined 2-3 healthy coping skills they would like to become more familiar with and use more often. Patients provided support and ideas to each other.   Summary of Patient Progress:    Did not attend group  Therapeutic Modalities Cognitive Behavioral Therapy Motivational Interviewing Dialectical Behavioral Therapy  Izell [place], LCSW 06/02/2023  1:24 PM

## 2023-06-02 NOTE — [facility] Group Notes (Signed)
Adult Psychoeducational Group Note  Date:  06/02/2023 Time:  9:38 PM  Group Topic/Focus:  Wrap-Up Group:   The focus of this group is to help patients review their daily goal of treatment and discuss progress on daily workbooks.  Participation Level:  Active  Participation Quality:  Appropriate  Affect:  Appropriate  Cognitive:  Appropriate  Insight: Appropriate  Engagement in Group:  Engaged  Modes of Intervention:  Discussion and Support  Additional Comments:  Pt told that today was a good day on the unit, the highlight of which was a helpful conversation with the Nurse Practitioner. ("She really opened my eyes to the situation I'm in.") On the subject of staying well upon discharge, Pt discussed wanting to cut toxic people out of his life. Pt rated his day a 7 out of 10.  Brian Nolan 06/02/2023, 9:38 PM

## 2023-06-02 NOTE — Progress Notes (Signed)
   06/01/23 2300  Psych Admission Type (Psych Patients Only)  Admission Status Voluntary  Psychosocial Assessment  Patient Complaints Anxiety;Depression  Eye Contact Brief  Facial Expression Sad  Affect Depressed  Speech Logical/coherent  Interaction Assertive  Motor Activity Slow  Appearance/Hygiene Unremarkable  Behavior Characteristics Cooperative  Mood Depressed  Thought Process  Coherency WDL  Content WDL  Delusions WDL  Perception WDL  Hallucination None reported or observed  Judgment WDL  Confusion WDL  Danger to Self  Current suicidal ideation?  (Denies)  Agreement Not to Harm Self Yes  Description of Agreement verbal

## 2023-06-03 ENCOUNTER — Encounter (HOSPITAL_COMMUNITY): Payer: Self-pay

## 2023-06-03 DIAGNOSIS — F332 Major depressive disorder, recurrent severe without psychotic features: Secondary | ICD-10-CM | POA: Diagnosis not present

## 2023-06-03 MED ORDER — SERTRALINE HCL 25 MG PO TABS
75.0000 mg | ORAL_TABLET | Freq: Every day | ORAL | Status: DC
  Administered 2023-06-04 – 2023-06-06 (×3): 75 mg via ORAL
  Filled 2023-06-03 (×5): qty 3

## 2023-06-03 NOTE — Plan of Care (Signed)
  Problem: Education: Goal: Knowledge of [facility] Education information/materials will improve Outcome: Progressing Goal: Emotional status will improve Outcome: Progressing Goal: Mental status will improve Outcome: Progressing Goal: Verbalization of understanding the information provided will improve Outcome: Progressing   Problem: Activity: Goal: Interest or engagement in activities will improve Outcome: Progressing Goal: Sleeping patterns will improve Outcome: Progressing   Problem: Safety: Goal: Periods of time without injury will increase Outcome: Progressing

## 2023-06-03 NOTE — Plan of Care (Signed)
°  Problem: Education: °Goal: Emotional status will improve °Outcome: Progressing °Goal: Mental status will improve °Outcome: Progressing °Goal: Verbalization of understanding the information provided will improve °Outcome: Progressing °  °

## 2023-06-03 NOTE — [facility] Group Notes (Signed)
Adult Psychoeducational Group Note  Date:  06/03/2023 Time:  10:57 PM  Group Topic/Focus:  Narcotics Anonymos  Participation Level:  Active  Participation Quality:  Appropriate  Affect:  Appropriate  Cognitive:  Appropriate  Insight: Appropriate  Engagement in Group:  Engaged  Modes of Intervention:  Education  Additional Comments:  Pt attend and participated  in the NA group. Pt listen to the speaker as they share their stories. Pt also took part in the serenity prayer.  Dariusz Brase, Sharen Counter 06/03/2023, 10:57 PM

## 2023-06-03 NOTE — Progress Notes (Signed)
[facility] MD Progress Note  06/03/2023 3:21 PM Brian Nolan  MRN:  [id number]  Subjective:     Brian Nolan is a 42 yr old male who presented on 9/29 to [facility] with depression and SI with a plan (jump off a bridge), he was admitted to [facility] on 9/30. PPHx is significant for MDD, GAD, and Polysubstance Abuse (EtOH, Cocaine), and Prior Suicide Attempt (10/2021-held gun to head and pulled trigger), a remote history of Self Injurious Behavior (Cutting-last 2014), and Multiple Prior Psychiatric Hospitalizations (last- [facility] 03/2022).     Yesterday the psychiatry team made the following recommendations: -Continue Zoloft 50 mg daily for depression and anxiety. -Continue Seroquel 50 mg QHS for augmentation  On assessment today, the pt reports that their mood is slightly less depressed but still sad most of the day.  Reports that anxiety is increased due to worry about finding a new job.  Sleep is better with seroquel.  Appetite is stable.  Concentration is better.  Energy level is improving.  Reports having less frequent and less intense suicidal thoughts. Denies having any active suicidal intent and plan.  Denies having any HI.  Denies having psychotic symptoms, which is an improvement since yesterday.   Denies having side effects to current psychiatric medications.   We discussed changes to current medication regimen, including increasing zoloft for depressive symptoms.   Discussed the following psychosocial stressors: finding a new job.     Principal Problem: MDD (major depressive disorder), recurrent severe, without psychosis (HCC) Diagnosis: Principal Problem:   MDD (major depressive disorder), recurrent severe, without psychosis (HCC) Active Problems:   Polysubstance abuse (HCC)   GAD (generalized anxiety disorder)  Total Time spent with patient: 20 minutes  Past Psychiatric History:  MDD, GAD, and Polysubstance Abuse (EtOH, Cocaine), and Prior Suicide Attempt  (10/2021-held gun to head and pulled trigger), a remote history of Self Injurious Behavior (Cutting-last 2014), and Multiple Prior Psychiatric Hospitalizations (last- [facility] 03/2022).   Past Medical History:  Past Medical History:  Diagnosis Date   Depression    History of hiatal hernia    Schizophrenia (HCC)    Substance abuse (HCC)     Past Surgical History:  Procedure Laterality Date   NO PAST SURGERIES     Family History:  Family History  Problem Relation Age of Onset   Schizophrenia Maternal Uncle    Diabetes Mellitus II Father    Family Psychiatric  History: See H&P   Social History:  Social History   Substance and Sexual Activity  Alcohol Use Yes   Alcohol/week: 6.0 - 24.0 standard drinks of alcohol   Types: 6 - 24 Cans of beer per week   Comment: 12 ppd     Social History   Substance and Sexual Activity  Drug Use Yes   Types: "Crack" cocaine, Marijuana, Cocaine    Social History   Socioeconomic History   Marital status: Single    Spouse name: Not on file   Number of children: Not on file   Years of education: Not on file   Highest education level: Not on file  Occupational History   Not on file  Tobacco Use   Smoking status: Every Day    Current packs/day: 1.00    Average packs/day: 1 pack/day for 14.3 years (14.3 ttl pk-yrs)    Types: Cigarettes    Start date: 01/31/2008    Last attempt to quit: 01/30/2018    Passive exposure: Never   Smokeless  tobacco: Never  Vaping Use   Vaping status: Former  Substance and Sexual Activity   Alcohol use: Yes    Alcohol/week: 6.0 - 24.0 standard drinks of alcohol    Types: 6 - 24 Cans of beer per week    Comment: 12 ppd   Drug use: Yes    Types: "Crack" cocaine, Marijuana, Cocaine   Sexual activity: Yes    Birth control/protection: Condom  Other Topics Concern   Not on file  Social History Narrative   Not on file   Social Determinants of Health   Financial Resource Strain: Not on file  Food  Insecurity: No Food Insecurity (06/01/2023)   Hunger Vital Sign    Worried About Running Out of Food in the Last Year: Never true    Ran Out of Food in the Last Year: Never true  Transportation Needs: No Transportation Needs (06/01/2023)   PRAPARE - Administrator, Civil Service (Medical): No    Lack of Transportation (Non-Medical): No  Physical Activity: Not on file  Stress: Not on file  Social Connections: Not on file   Additional Social History:                         Current Medications: Current Facility-Administered Medications  Medication Dose Route Frequency Provider Last Rate Last Admin   acetaminophen (TYLENOL) tablet 650 mg  650 mg Oral Q6H PRN Motley-Mangrum, Jadeka A, PMHNP       alum & mag hydroxide-simeth (MAALOX/MYLANTA) 200-200-20 MG/5ML suspension 30 mL  30 mL Oral Q4H PRN Motley-Mangrum, Jadeka A, PMHNP   30 mL at 06/03/23 1409   diphenhydrAMINE (BENADRYL) capsule 50 mg  50 mg Oral TID PRN Motley-Mangrum, Jadeka A, PMHNP       Or   diphenhydrAMINE (BENADRYL) injection 50 mg  50 mg Intramuscular TID PRN Motley-Mangrum, Jadeka A, PMHNP       haloperidol (HALDOL) tablet 5 mg  5 mg Oral TID PRN Motley-Mangrum, Jadeka A, PMHNP       Or   haloperidol lactate (HALDOL) injection 5 mg  5 mg Intramuscular TID PRN Motley-Mangrum, Jadeka A, PMHNP       hydrOXYzine (ATARAX) tablet 25 mg  25 mg Oral TID PRN Motley-Mangrum, Jadeka A, PMHNP   25 mg at 06/02/23 1857   loperamide (IMODIUM) capsule 2-4 mg  2-4 mg Oral PRN Lauro Franklin, MD       LORazepam (ATIVAN) tablet 2 mg  2 mg Oral TID PRN Motley-Mangrum, Jadeka A, PMHNP       Or   LORazepam (ATIVAN) injection 2 mg  2 mg Intramuscular TID PRN Motley-Mangrum, Jadeka A, PMHNP       LORazepam (ATIVAN) tablet 1 mg  1 mg Oral Q6H PRN Pashayan, Mardelle Matte, MD       magnesium hydroxide (MILK OF MAGNESIA) suspension 30 mL  30 mL Oral Daily PRN Motley-Mangrum, Jadeka A, PMHNP       multivitamin with minerals  tablet 1 tablet  1 tablet Oral Daily Lauro Franklin, MD   1 tablet at 06/03/23 0844   nicotine (NICODERM CQ - dosed in mg/24 hours) patch 14 mg  14 mg Transdermal Daily PRN Lauro Franklin, MD       ondansetron (ZOFRAN-ODT) disintegrating tablet 4 mg  4 mg Oral Q6H PRN Lauro Franklin, MD       QUEtiapine (SEROQUEL) tablet 50 mg  50 mg Oral QHS Motley-Mangrum, Ezra Sites, PMHNP  50 mg at 06/02/23 2050   [START ON 06/04/2023] sertraline (ZOLOFT) tablet 75 mg  75 mg Oral Daily Aydrien Froman, MD       thiamine (Vitamin B-1) tablet 100 mg  100 mg Oral Daily Lauro Franklin, MD   100 mg at 06/03/23 0844   traZODone (DESYREL) tablet 50 mg  50 mg Oral QHS PRN Motley-Mangrum, Ezra Sites, PMHNP   50 mg at 06/02/23 2050    Lab Results:  Results for orders placed or performed during the hospital encounter of 06/01/23 (from the past 48 hour(s))  Lipid panel     Status: Abnormal   Collection Time: 06/02/23  6:32 AM  Result Value Ref Range   Cholesterol 185 0 - 200 mg/dL   Triglycerides 161 (H) <150 mg/dL   HDL 41 >09 mg/dL   Total CHOL/HDL Ratio 4.5 RATIO   VLDL UNABLE TO CALCULATE IF TRIGLYCERIDE OVER 400 mg/dL 0 - 40 mg/dL   LDL Cholesterol UNABLE TO CALCULATE IF TRIGLYCERIDE OVER 400 mg/dL 0 - 99 mg/dL    Comment:        Total Cholesterol/HDL:CHD Risk Coronary Heart Disease Risk Table                     Men   Women  1/2 Average Risk   3.4   3.3  Average Risk       5.0   4.4  2 X Average Risk   9.6   7.1  3 X Average Risk  23.4   11.0        Use the calculated Patient Ratio above and the CHD Risk Table to determine the patient's CHD Risk.        ATP III CLASSIFICATION (LDL):  <100     mg/dL   Optimal  604-540  mg/dL   Near or Above                    Optimal  130-159  mg/dL   Borderline  981-191  mg/dL   High  >478     mg/dL   Very High Performed at [facility], [address], [place], [place] [address]   LDL cholesterol, direct      Status: None   Collection Time: 06/02/23  6:32 AM  Result Value Ref Range   Direct LDL 73 0 - 99 mg/dL    Comment: Performed at [facility], [address], [place], [place] [address]    Blood Alcohol level:  Lab Results  Component Value Date   [facility] <10 05/31/2023   ETH <10 03/11/2022    Metabolic Disorder Labs: Lab Results  Component Value Date   HGBA1C 5.7 (H) 05/31/2023   MPG 116.89 05/31/2023   MPG 99.67 11/16/2021   No results found for: "PROLACTIN" Lab Results  Component Value Date   CHOL 185 06/02/2023   TRIG 443 (H) 06/02/2023   HDL 41 06/02/2023   CHOLHDL 4.5 06/02/2023   VLDL UNABLE TO CALCULATE IF TRIGLYCERIDE OVER 400 mg/dL 57/84/6962   LDLCALC UNABLE TO CALCULATE IF TRIGLYCERIDE OVER 400 mg/dL 95/28/4132   LDLCALC 89 11/16/2021    Physical Findings: AIMS:  , ,  ,  ,    CIWA:  CIWA-Ar Total: 2 COWS:     Musculoskeletal: Strength & Muscle Tone: within normal limits Gait & Station: normal Patient leans: N/A  Psychiatric Specialty Exam:  Presentation  General Appearance:  Fairly Groomed  Eye Contact: Fair  Speech: Normal Rate  Speech Volume: Decreased  Handedness: Right   Mood and Affect  Mood: Anxious; Depressed  Affect: Depressed; Constricted   Thought Process  Thought Processes: Linear  Descriptions of Associations:Intact  Orientation:Full (Time, Place and Person)  Thought Content:Logical  History of Schizophrenia/Schizoaffective disorder:No  Duration of Psychotic Symptoms:Less than six months  Hallucinations:Hallucinations: None  Ideas of Reference:None  Suicidal Thoughts:Suicidal Thoughts: Yes, Passive SI Active Intent and/or Plan: Without Intent; Without Plan  Homicidal Thoughts:Homicidal Thoughts: No   Sensorium  Memory: Immediate Good; Recent Good; Remote Good  Judgment: Fair  Insight: Fair   Chartered certified accountant: Fair  Attention Span: Fair  Recall: Good  Fund of  Knowledge: Good  Language: Good   Psychomotor Activity  Psychomotor Activity: Psychomotor Activity: Normal   Assets  Assets: Communication Skills; Desire for Improvement; Resilience   Sleep  Sleep: Sleep: Fair    Physical Exam: Physical Exam Vitals reviewed.  Constitutional:      General: He is not in acute distress.    Appearance: He is not toxic-appearing.  Pulmonary:     Effort: Pulmonary effort is normal. No respiratory distress.  Neurological:     Mental Status: He is alert.     Motor: No weakness.     Gait: Gait normal.  Psychiatric:        Behavior: Behavior normal.        Judgment: Judgment normal.    Review of Systems  Constitutional:  Negative for chills and fever.  Cardiovascular:  Negative for chest pain and palpitations.  Neurological:  Negative for dizziness, tingling, tremors and headaches.  Psychiatric/Behavioral:  Positive for depression, substance abuse and suicidal ideas. Negative for hallucinations and memory loss. The patient is nervous/anxious. The patient does not have insomnia.   All other systems reviewed and are negative.  Blood pressure (!) 113/93, pulse 95, temperature 98.7 F (37.1 C), temperature source Oral, resp. rate 14, height 5\' 9"  (1.753 m), weight 108.4 kg, SpO2 100%. Body mass index is 35.29 kg/m.   Treatment Plan Summary: Daily contact with patient to assess and evaluate symptoms and progress in treatment and Medication management    ASSESSMENT: MDD severe recurrent w/ psychotic features  GAD Alcohol use disorder  stimulant use disorder   PLAN: Safety and Monitoring:             --  Voluntary admission to inpatient psychiatric unit for safety, stabilization and treatment             -- Daily contact with patient to assess and evaluate symptoms and progress in treatment             -- Patient's case to be discussed in multi-disciplinary team meeting             -- Observation Level : q15 minute checks              -- Vital signs:  q12 hours             -- Precautions: suicide, elopement, and assault   2. Psychiatric Diagnoses and Treatment:              Major depressive disorder, recurrent severe, without psychosis I Generalized anxiety disorder               -Increase Zoloft from 50 mg to 75 mg daily for depression and anxiety. -Continue Seroquel 50 mg QHS for augmentation -Continue Agitation Protocol: Haldol/Ativan/Benadryl     Withdrawal: -Continue CIWA,  -Continue  Ativan 1 mg q6 PRN CIWA>10 -Continue Imodium 2-4 mg PRN diarrhea -Continue  Zofran-ODT 4 mg q6 PRN nausea -Continue Thiamine 100 mg daily for nutritional supplementation -Continue  Multivitamin daily for nutritional supplementation     Nicotine Dependence: -Continue Nicotine Patch 14 mg PRN daily -Smoking cessation encouraged   -Continue PRN's: Tylenol, Maalox, Atarax, Milk of Magnesia, Trazodone     -- Encouraged patient to participate in unit milieu and in scheduled group therapies              -- Short Term Goals: Ability to identify changes in lifestyle to reduce recurrence of condition will improve, Ability to verbalize feelings will improve, Ability to disclose and discuss suicidal ideas, and Ability to identify and develop effective coping behaviors will improve             -- Long Term Goals: Improvement in symptoms so as ready for discharge                  Discharge Planning:              -- Social work and case management to assist with discharge planning and identification of hospital follow-up needs prior to discharge             -- Estimated LOS: 4-5 more days             -- Discharge Concerns: Need to establish a safety plan; Medication compliance and effectiveness             -- Discharge Goals: Return home with outpatient referrals for mental health follow-up including medication management/psychotherapy    Cristy Hilts, MD 06/03/2023, 3:21 PM  Total Time Spent in Direct Patient Care:  I  personally spent 35 minutes on the unit in direct patient care. The direct patient care time included face-to-face time with the patient, reviewing the patient's chart, communicating with other professionals, and coordinating care. Greater than 50% of this time was spent in counseling or coordinating care with the patient regarding goals of hospitalization, psycho-education, and discharge planning needs.   Phineas Inches, MD Psychiatrist

## 2023-06-03 NOTE — Plan of Care (Signed)
  Problem: Education: Goal: Knowledge of [facility] Education information/materials will improve Outcome: Progressing Goal: Emotional status will improve Outcome: Progressing Goal: Mental status will improve Outcome: Progressing Goal: Verbalization of understanding the information provided will improve Outcome: Progressing   

## 2023-06-03 NOTE — Progress Notes (Signed)
   06/03/23 2108  Psych Admission Type (Psych Patients Only)  Admission Status Voluntary  Psychosocial Assessment  Patient Complaints Anxiety;Depression  Eye Contact Brief  Facial Expression Anxious  Affect Anxious;Depressed  Speech Logical/coherent  Interaction Assertive  Motor Activity Slow  Appearance/Hygiene Unremarkable  Behavior Characteristics Cooperative  Mood Anxious  Thought Process  Coherency WDL  Content WDL  Delusions None reported or observed  Perception WDL  Hallucination None reported or observed  Judgment Impaired  Confusion None  Danger to Self  Current suicidal ideation? Denies  Agreement Not to Harm Self Yes  Description of Agreement Verbal

## 2023-06-03 NOTE — [facility] Group Notes (Signed)
Spiritual care group facilitated by Chaplain Dyanne Carrel, [facility]  Group focused on topic of strength. Group members reflected on what thoughts and feelings emerge when they hear this topic. They then engaged in facilitated dialog around how strength is present in their lives. This dialog focused on representing what strength had been to them in their lives (images and patterns given) and what they saw as helpful in their life now (what they needed / wanted).  Activity drew on narrative framework.  Patient Progress: Brian Nolan attended group.  Although verbal participation was minimal, he was engaged for the duration of the group.

## 2023-06-03 NOTE — Progress Notes (Signed)
   06/03/23 0820  Psych Admission Type (Psych Patients Only)  Admission Status Voluntary  Psychosocial Assessment  Patient Complaints Anxiety;Depression  Eye Contact Brief  Facial Expression Anxious;Sad  Affect Anxious;Depressed  Speech Logical/coherent  Interaction Assertive  Motor Activity Other (Comment) (WNL)  Appearance/Hygiene Unremarkable  Behavior Characteristics Cooperative  Mood Depressed;Anxious  Thought Process  Coherency WDL  Content WDL  Delusions None reported or observed  Perception WDL  Hallucination None reported or observed  Judgment Impaired  Confusion WDL  Danger to Self  Current suicidal ideation? Denies  Self-Injurious Behavior No self-injurious ideation or behavior indicators observed or expressed   Agreement Not to Harm Self Yes  Description of Agreement Verbal  Danger to Others  Danger to Others None reported or observed  Danger to Others Abnormal  Harmful Behavior to others No threats or harm toward other people  Destructive Behavior No threats or harm toward property

## 2023-06-03 NOTE — Group Note (Signed)
Date:  06/03/2023 Time:  12:13 PM  Group Topic/Focus:  Goals Group:   The focus of this group is to help patients establish daily goals to achieve during treatment and discuss how the patient can incorporate goal setting into their daily lives to aide in recovery.    Participation Level:  Active  Participation Quality:  Appropriate  Affect:  Anxious  Cognitive:  Alert and Appropriate  Insight: Appropriate  Engagement in Group:  Engaged  Modes of Intervention:  Discussion  Additional Comments:    Beckie Busing 06/03/2023, 12:13 PM

## 2023-06-03 NOTE — Group Note (Signed)
Recreation Therapy Group Note   Group Topic:Other  Group Date: 06/03/2023 Start Time: 1405 End Time: 1450 Facilitators: Tami Blass-McCall, LRT,CTRS Location: [address] Dayroom   Activity Description/Intervention: Therapeutic Drumming. Patients with peers and staff were given the opportunity to engage in a leader facilitated [facility] Group Empowerment Drumming Circle with staff from the [employer], in partnership with [employer]. Teaching laboratory technician and trained [employer], Theodoro Doing leading with LRT observing and documenting intervention and pt response. This evidenced-based practice targets 7 areas of health and wellbeing in the human experience including: stress-reduction, exercise, self-expression, camaraderie/support, nurturing, spirituality, and music-making (leisure).   Goal Area(s) Addresses:  Patient will engage in pro-social way in music group.  Patient will follow directions of drum leader on the first prompt. Patient will demonstrate no behavioral issues during group.  Patient will identify if a reduction in stress level occurs as a result of participation in therapeutic drum circle.    Education: Leisure exposure, Pharmacologist, Musical expression, Discharge Planning  [place] actively engaged in therapeutic drumming exercise and discussions. Pt was appropriate with peers, staff, and musical equipment for duration of programming.  Pt identified "great" as their feeling after participation in music-based programming. Pt affect congruent/incongruent with verbalized emotion.    Affect/Mood: Appropriate   Participation Level: Engaged   Participation Quality: Independent   Behavior: Appropriate   Speech/Thought Process: Focused   Insight: Good   Judgement: Good   Modes of Intervention: Teaching laboratory technician   Patient Response to Interventions:  Engaged   Education Outcome:  In group clarification offered    Clinical  Observations/Individualized Feedback:    Plan: Continue to engage patient in RT group sessions 2-3x/week.   Phyllistine Domingos-McCall, LRT,CTRS 06/03/2023 3:30 PM

## 2023-06-03 NOTE — BH IP Treatment Plan (Signed)
Interdisciplinary Treatment and Diagnostic Plan Initial  06/03/2023 Time of Session: [address] Brian Nolan MRN: [id number]  Principal Diagnosis: MDD (major depressive disorder), recurrent severe, without psychosis (HCC)  Secondary Diagnoses: Principal Problem:   MDD (major depressive disorder), recurrent severe, without psychosis (HCC) Active Problems:   Polysubstance abuse (HCC)   GAD (generalized anxiety disorder)   Current Medications:  Current Facility-Administered Medications  Medication Dose Route Frequency Provider Last Rate Last Admin   acetaminophen (TYLENOL) tablet 650 mg  650 mg Oral Q6H PRN Motley-Mangrum, Jadeka A, PMHNP       alum & mag hydroxide-simeth (MAALOX/MYLANTA) 200-200-20 MG/5ML suspension 30 mL  30 mL Oral Q4H PRN Motley-Mangrum, Jadeka A, PMHNP   30 mL at 06/02/23 1858   diphenhydrAMINE (BENADRYL) capsule 50 mg  50 mg Oral TID PRN Motley-Mangrum, Jadeka A, PMHNP       Or   diphenhydrAMINE (BENADRYL) injection 50 mg  50 mg Intramuscular TID PRN Motley-Mangrum, Jadeka A, PMHNP       haloperidol (HALDOL) tablet 5 mg  5 mg Oral TID PRN Motley-Mangrum, Jadeka A, PMHNP       Or   haloperidol lactate (HALDOL) injection 5 mg  5 mg Intramuscular TID PRN Motley-Mangrum, Geralynn Ochs A, PMHNP       hydrOXYzine (ATARAX) tablet 25 mg  25 mg Oral TID PRN Motley-Mangrum, Jadeka A, PMHNP   25 mg at 06/02/23 1857   loperamide (IMODIUM) capsule 2-4 mg  2-4 mg Oral PRN Lauro Franklin, MD       LORazepam (ATIVAN) tablet 2 mg  2 mg Oral TID PRN Motley-Mangrum, Geralynn Ochs A, PMHNP       Or   LORazepam (ATIVAN) injection 2 mg  2 mg Intramuscular TID PRN Motley-Mangrum, Jadeka A, PMHNP       LORazepam (ATIVAN) tablet 1 mg  1 mg Oral Q6H PRN Pashayan, Mardelle Matte, MD       magnesium hydroxide (MILK OF MAGNESIA) suspension 30 mL  30 mL Oral Daily PRN Motley-Mangrum, Jadeka A, PMHNP       multivitamin with minerals tablet 1 tablet  1 tablet Oral Daily Pashayan, Mardelle Matte, MD   1  tablet at 06/03/23 0844   nicotine (NICODERM CQ - dosed in mg/24 hours) patch 14 mg  14 mg Transdermal Daily PRN Lauro Franklin, MD       ondansetron (ZOFRAN-ODT) disintegrating tablet 4 mg  4 mg Oral Q6H PRN Lauro Franklin, MD       QUEtiapine (SEROQUEL) tablet 50 mg  50 mg Oral QHS Motley-Mangrum, Jadeka A, PMHNP   50 mg at 06/02/23 2050   sertraline (ZOLOFT) tablet 50 mg  50 mg Oral Daily Motley-Mangrum, Jadeka A, PMHNP   50 mg at 06/03/23 0843   thiamine (Vitamin B-1) tablet 100 mg  100 mg Oral Daily Lauro Franklin, MD   100 mg at 06/03/23 0844   traZODone (DESYREL) tablet 50 mg  50 mg Oral QHS PRN Motley-Mangrum, Jadeka A, PMHNP   50 mg at 06/02/23 2050   PTA Medications: Medications Prior to Admission  Medication Sig Dispense Refill Last Dose   QUEtiapine (SEROQUEL) 50 MG tablet Take 1 tablet (50 mg total) by mouth at bedtime. (Patient not taking: Reported on 03/11/2022) 90 tablet 0    sertraline (ZOLOFT) 50 MG tablet Take 1 tablet (50 mg total) by mouth daily. (Patient not taking: Reported on 03/11/2022) 90 tablet 0    traZODone (DESYREL) 50 MG tablet Take 1 tablet (50 mg  total) by mouth at bedtime as needed for sleep.       Patient Stressors: Financial difficulties   Marital or family conflict   Substance abuse    Patient Strengths: Ability for insight  Active sense of humor  Average or above average intelligence  Capable of independent living  Communication skills   Treatment Modalities: Medication Management, Group therapy, Case management,  1 to 1 session with clinician, Psychoeducation, Recreational therapy.   Physician Treatment Plan for Primary Diagnosis: MDD (major depressive disorder), recurrent severe, without psychosis (HCC) Long Term Goal(s): Improvement in symptoms so as ready for discharge   Short Term Goals: Ability to identify changes in lifestyle to reduce recurrence of condition will improve Ability to verbalize feelings will  improve Ability to disclose and discuss suicidal ideas Ability to identify and develop effective coping behaviors will improve  Medication Management: Evaluate patient's response, side effects, and tolerance of medication regimen.  Therapeutic Interventions: 1 to 1 sessions, Unit Group sessions and Medication administration.  Evaluation of Outcomes: Progressing  Physician Treatment Plan for Secondary Diagnosis: Principal Problem:   MDD (major depressive disorder), recurrent severe, without psychosis (HCC) Active Problems:   Polysubstance abuse (HCC)   GAD (generalized anxiety disorder)  Long Term Goal(s): Improvement in symptoms so as ready for discharge   Short Term Goals: Ability to identify changes in lifestyle to reduce recurrence of condition will improve Ability to verbalize feelings will improve Ability to disclose and discuss suicidal ideas Ability to identify and develop effective coping behaviors will improve     Medication Management: Evaluate patient's response, side effects, and tolerance of medication regimen.  Therapeutic Interventions: 1 to 1 sessions, Unit Group sessions and Medication administration.  Evaluation of Outcomes: Progressing   RN Treatment Plan for Primary Diagnosis: MDD (major depressive disorder), recurrent severe, without psychosis (HCC) Long Term Goal(s): Knowledge of disease and therapeutic regimen to maintain health will improve  Short Term Goals: Ability to remain free from injury will improve, Ability to verbalize frustration and anger appropriately will improve, Ability to demonstrate self-control, Ability to participate in decision making will improve, Ability to verbalize feelings will improve, Ability to disclose and discuss suicidal ideas, Ability to identify and develop effective coping behaviors will improve, and Compliance with prescribed medications will improve  Medication Management: RN will administer medications as ordered by  provider, will assess and evaluate patient's response and provide education to patient for prescribed medication. RN will report any adverse and/or side effects to prescribing provider.  Therapeutic Interventions: 1 on 1 counseling sessions, Psychoeducation, Medication administration, Evaluate responses to treatment, Monitor vital signs and CBGs as ordered, Perform/monitor CIWA, COWS, AIMS and Fall Risk screenings as ordered, Perform wound care treatments as ordered.  Evaluation of Outcomes: Progressing   LCSW Treatment Plan for Primary Diagnosis: MDD (major depressive disorder), recurrent severe, without psychosis (HCC) Long Term Goal(s): Safe transition to appropriate next level of care at discharge, Engage patient in therapeutic group addressing interpersonal concerns.  Short Term Goals: Engage patient in aftercare planning with referrals and resources, Increase social support, Increase ability to appropriately verbalize feelings, Increase emotional regulation, Facilitate acceptance of mental health diagnosis and concerns, Facilitate patient progression through stages of change regarding substance use diagnoses and concerns, Identify triggers associated with mental health/substance abuse issues, and Increase skills for wellness and recovery  Therapeutic Interventions: Assess for all discharge needs, 1 to 1 time with Social worker, Explore available resources and support systems, Assess for adequacy in community support network, Educate family  and significant other(s) on suicide prevention, Complete Psychosocial Assessment, Interpersonal group therapy.  Evaluation of Outcomes: Progressing   Progress in Treatment: Attending groups: Yes. Participating in groups: Yes. Taking medication as prescribed: Yes. Toleration medication: Yes. Family/Significant other contact made: No, will contact:  Consents Pending Patient understands diagnosis: Yes. Discussing patient identified problems/goals with  staff: Yes. Medical problems stabilized or resolved: Yes. Denies suicidal/homicidal ideation: Yes. Issues/concerns per patient self-inventory: Yes. Other: N/A  New problem(s) identified: No, Describe:  None Reported  New Short Term/Long Term Goal(s): medication stabilization, elimination of SI thoughts, development of comprehensive mental wellness plan  Patient Goals:  Medication Stabilization  Discharge Plan or Barriers: :  Patient recently admitted. CSW will continue to follow and assess for appropriate referrals and possible discharge planning.   Reason for Continuation of Hospitalization: Anxiety Depression Medication stabilization Suicidal ideation Withdrawal symptoms  Estimated Length of Stay: 5-7 Days  Last 3 [place] Suicide Severity Risk Score: Flowsheet Row [facility] (Current) [facility] from 05[facility]31/2023 in [facility] ED from [facility]20[facility]  C-SSRS RISK CATEGORY Low Risk Error: Question 1 not populated High Risk       Last [facility] 2/9 Scores:    05/31/2023    3:41 PM 12/25/2021   10:31 AM  Depression screen PHQ 2/9  Decreased Interest 3 0  Down, Depressed, Hopeless 3 0  PHQ - 2 Score 6 0  Altered sleeping 2   Tired, decreased energy 2   Change in appetite 1   Feeling bad or failure about yourself  2   Trouble concentrating 1   Moving slowly or fidgety/restless 0   Suicidal thoughts 2   PHQ-9 Score 16   Difficult doing work/chores Very difficult    detox, medication management for mood stabilization; elimination of SI thoughts; development of comprehensive mental wellness/sobriety plan    Scribe for Treatment Team: Ane Payment, LCSW 06/03/2023 1:34 PM

## 2023-06-04 DIAGNOSIS — F332 Major depressive disorder, recurrent severe without psychotic features: Secondary | ICD-10-CM | POA: Diagnosis not present
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
## 2023-06-04 NOTE — Group Note (Signed)
Date:  06/04/2023 Time:  9:14 AM  Group Topic/Focus:  Goals Group:   The focus of this group is to help patients establish daily goals to achieve during treatment and discuss how the patient can incorporate goal setting into their daily lives to aide in recovery.    Participation Level:  Did Not Attend  Participation Quality:   NA  Affect:   NA  Cognitive:   NA  Insight: None  Engagement in Group:   NA  Modes of Intervention:   NA  Additional Comments:  NA  Beckie Busing 06/04/2023, 9:14 AM

## 2023-06-04 NOTE — Group Note (Signed)
LCSW Group Therapy Note   Group Date: 06/04/2023 Start Time: 1100 End Time: 1200   Type of Therapy and Topic:  Group Therapy: Boundaries  Participation Level:  None  Description of Group: This group will address the use of boundaries in their personal lives. Patients will explore why boundaries are important, the difference between healthy and unhealthy boundaries, and negative and postive outcomes of different boundaries and will look at how boundaries can be crossed.  Patients will be encouraged to identify current boundaries in their own lives and identify what kind of boundary is being set. Facilitators will guide patients in utilizing problem-solving interventions to address and correct types boundaries being used and to address when no boundary is being used. Understanding and applying boundaries will be explored and addressed for obtaining and maintaining a balanced life. Patients will be encouraged to explore ways to assertively make their boundaries and needs known to significant others in their lives, using other group members and facilitator for role play, support, and feedback.  Therapeutic Goals:  1.  Patient will identify areas in their life where setting clear boundaries could be  used to improve their life.  2.  Patient will identify signs/triggers that a boundary is not being respected. 3.  Patient will identify two ways to set boundaries in order to achieve balance in  their lives: 4.  Patient will demonstrate ability to communicate their needs and set boundaries  through discussion and/or role plays  Summary of Patient Progress:  Patient demonstrated no insight into the subject matter, was respectful of peers, and was present throughout the entire session.  Therapeutic Modalities:   Cognitive Behavioral Therapy Solution-Focused Therapy  Marinda Elk, LCSW 06/04/2023  3:02 PM

## 2023-06-04 NOTE — Progress Notes (Addendum)
[facility] MD Progress Note  06/04/2023 12:18 PM Bronsen Suhail Peloquin  MRN:  [id number]  Reason for Admission:   Findlay A. Andreoni is a 42 yr old male who presented on 9/29 to [facility] with depression and SI with a plan (jump off a bridge), he was admitted to [facility] on 9/30. PPHx is significant for MDD, GAD, and Polysubstance Abuse (EtOH, Cocaine), and Prior Suicide Attempt (10/2021-held gun to head and pulled trigger), a remote history of Self Injurious Behavior (Cutting-last 2014), and Multiple Prior Psychiatric Hospitalizations (last- [facility] 03/2022).      24 hr Chart Review: Vital signs within defined limits. CIWA score: zero. Patient is compliant with routine medication regimen without difficulty. Sleep hours last night: 7.5 hours, as documented in the nursing flow sheets. No behavioral episodes or nursing concerns were reported at this time. PRN medication administered according to the nursing record: Trazodone for sleep support and Maalox for indigestion.   During today's rounds, the patient was observed sitting in the day room.  He agrees to speak privately for this assessment.  On today's assessment, he described his mood as "I'm happy."  He mentions feeling better emotionally and attributes it to having plans "lined up after discharge." He rated his anxiety as 0 and depression as a 2 out of 10, with 10 being the most severe. He reported adequate sleep last night.  His appetite is described as minimally decreased, though he reports eating 3 meals per day. Concentration is reported as "better.".  Regarding his energy level, he reports currently feeling slightly fatigued due to receiving Seroquel (routine) and trazodone (as needed) at bedtime; However, he states his energy improves as the day progresses. He denied having any suicidal thoughts, plans, or intent, as well as any homicidal ideation. Additionally, he denies auditory or visual hallucinations, thought insertion, or paranoia. Communication  during the encounter was clear, with no signs distractibility or preoccupation. The patient reported attending and actively participating in group sessions. No overt signs or symptoms of alcohol withdrawal noted during the encounter.   No signs of TD (Tardive Dyskinesia) or EPS (Extrapyramidal Symptoms) were observed during the assessment, and the patient reports no feelings of stiffness. AIMS score: 0.   Tentative discharge date set for Monday, October 7, as discussed in interdisciplinary team pending safety planning and outpatient follow-up appointments being completed by CSW.    We discussed changes to the current medication regimen: None --Discontinue PRN Trazodone due to daytime drowsiness.    The following medication changes were made during hospitalization: -Increase Zoloft from 50 mg to 75 mg daily for depression and anxiety. -Continue Seroquel 50 mg QHS for augmentation    Principal Problem: MDD (major depressive disorder), recurrent severe, without psychosis (HCC) Diagnosis: Principal Problem:   MDD (major depressive disorder), recurrent severe, without psychosis (HCC) Active Problems:   Polysubstance abuse (HCC)   GAD (generalized anxiety disorder)  Total Time spent with patient: 20 minutes  Past Psychiatric History:  MDD, GAD, and Polysubstance Abuse (EtOH, Cocaine), and Prior Suicide Attempt (10/2021-held gun to head and pulled trigger), a remote history of Self Injurious Behavior (Cutting-last 2014), and Multiple Prior Psychiatric Hospitalizations (last- [facility] 03/2022).   Past Medical History:  Past Medical History:  Diagnosis Date   Depression    History of hiatal hernia    Schizophrenia (HCC)    Substance abuse (HCC)     Past Surgical History:  Procedure Laterality Date   NO PAST SURGERIES     Family History:  Family History  Problem Relation Age of Onset   Schizophrenia Maternal Uncle    Diabetes Mellitus II Father    Family Psychiatric   History: See H&P   Social History:  Social History   Substance and Sexual Activity  Alcohol Use Yes   Alcohol/week: 6.0 - 24.0 standard drinks of alcohol   Types: 6 - 24 Cans of beer per week   Comment: 12 ppd     Social History   Substance and Sexual Activity  Drug Use Yes   Types: "Crack" cocaine, Marijuana, Cocaine    Social History   Socioeconomic History   Marital status: Single    Spouse name: Not on file   Number of children: Not on file   Years of education: Not on file   Highest education level: Not on file  Occupational History   Not on file  Tobacco Use   Smoking status: Every Day    Current packs/day: 1.00    Average packs/day: 1 pack/day for 14.3 years (14.3 ttl pk-yrs)    Types: Cigarettes    Start date: 01/31/2008    Last attempt to quit: 01/30/2018    Passive exposure: Never   Smokeless tobacco: Never  Vaping Use   Vaping status: Former  Substance and Sexual Activity   Alcohol use: Yes    Alcohol/week: 6.0 - 24.0 standard drinks of alcohol    Types: 6 - 24 Cans of beer per week    Comment: 12 ppd   Drug use: Yes    Types: "Crack" cocaine, Marijuana, Cocaine   Sexual activity: Yes    Birth control/protection: Condom  Other Topics Concern   Not on file  Social History Narrative   Not on file   Social Determinants of Health   Financial Resource Strain: Not on file  Food Insecurity: No Food Insecurity (06/01/2023)   Hunger Vital Sign    Worried About Running Out of Food in the Last Year: Never true    Ran Out of Food in the Last Year: Never true  Transportation Needs: No Transportation Needs (06/01/2023)   PRAPARE - Administrator, Civil Service (Medical): No    Lack of Transportation (Non-Medical): No  Physical Activity: Not on file  Stress: Not on file  Social Connections: Not on file   Additional Social History:                         Current Medications: Current Facility-Administered Medications  Medication Dose  Route Frequency Provider Last Rate Last Admin   acetaminophen (TYLENOL) tablet 650 mg  650 mg Oral Q6H PRN Motley-Mangrum, Jadeka A, PMHNP       alum & mag hydroxide-simeth (MAALOX/MYLANTA) 200-200-20 MG/5ML suspension 30 mL  30 mL Oral Q4H PRN Motley-Mangrum, Jadeka A, PMHNP   30 mL at 06/04/23 1037   diphenhydrAMINE (BENADRYL) capsule 50 mg  50 mg Oral TID PRN Motley-Mangrum, Jadeka A, PMHNP       Or   diphenhydrAMINE (BENADRYL) injection 50 mg  50 mg Intramuscular TID PRN Motley-Mangrum, Jadeka A, PMHNP       haloperidol (HALDOL) tablet 5 mg  5 mg Oral TID PRN Motley-Mangrum, Jadeka A, PMHNP       Or   haloperidol lactate (HALDOL) injection 5 mg  5 mg Intramuscular TID PRN Motley-Mangrum, Jadeka A, PMHNP       hydrOXYzine (ATARAX) tablet 25 mg  25 mg Oral TID PRN Motley-Mangrum, Ezra Sites, PMHNP  25 mg at 06/02/23 1857   loperamide (IMODIUM) capsule 2-4 mg  2-4 mg Oral PRN Lauro Franklin, MD       LORazepam (ATIVAN) tablet 2 mg  2 mg Oral TID PRN Motley-Mangrum, Geralynn Ochs A, PMHNP       Or   LORazepam (ATIVAN) injection 2 mg  2 mg Intramuscular TID PRN Motley-Mangrum, Jadeka A, PMHNP       LORazepam (ATIVAN) tablet 1 mg  1 mg Oral Q6H PRN Pashayan, Mardelle Matte, MD       magnesium hydroxide (MILK OF MAGNESIA) suspension 30 mL  30 mL Oral Daily PRN Motley-Mangrum, Jadeka A, PMHNP       multivitamin with minerals tablet 1 tablet  1 tablet Oral Daily Lauro Franklin, MD   1 tablet at 06/04/23 0751   nicotine (NICODERM CQ - dosed in mg/24 hours) patch 14 mg  14 mg Transdermal Daily PRN Lauro Franklin, MD       ondansetron (ZOFRAN-ODT) disintegrating tablet 4 mg  4 mg Oral Q6H PRN Lauro Franklin, MD       QUEtiapine (SEROQUEL) tablet 50 mg  50 mg Oral QHS Motley-Mangrum, Jadeka A, PMHNP   50 mg at 06/03/23 2108   sertraline (ZOLOFT) tablet 75 mg  75 mg Oral Daily Massengill, Harrold Donath, MD   75 mg at 06/04/23 0750   thiamine (Vitamin B-1) tablet 100 mg  100 mg Oral Daily  Lauro Franklin, MD   100 mg at 06/04/23 0751   traZODone (DESYREL) tablet 50 mg  50 mg Oral QHS PRN Motley-Mangrum, Jadeka A, PMHNP   50 mg at 06/03/23 2108    Lab Results:  No results found for this or any previous visit (from the past 48 hour(s)).   Blood Alcohol level:  Lab Results  Component Value Date   ETH <10 05/31/2023   ETH <10 03/11/2022    Metabolic Disorder Labs: Lab Results  Component Value Date   HGBA1C 5.7 (H) 05/31/2023   MPG 116.89 05/31/2023   MPG 99.67 11/16/2021   No results found for: "PROLACTIN" Lab Results  Component Value Date   CHOL 185 06/02/2023   TRIG 443 (H) 06/02/2023   HDL 41 06/02/2023   CHOLHDL 4.5 06/02/2023   VLDL UNABLE TO CALCULATE IF TRIGLYCERIDE OVER 400 mg/dL 16/06/9603   LDLCALC UNABLE TO CALCULATE IF TRIGLYCERIDE OVER 400 mg/dL 54/05/8118   LDLCALC 89 11/16/2021    Physical Findings: AIMS:  , ,  ,  ,    CIWA:  CIWA-Ar Total: 0 COWS:     Musculoskeletal: Strength & Muscle Tone: within normal limits Gait & Station: normal Patient leans: N/A  Psychiatric Specialty Exam:  Presentation  General Appearance:  Fairly Groomed  Eye Contact: Fair  Speech: Normal Rate  Speech Volume: Normal  Handedness: Right   Mood and Affect  Mood: Anxious; Dysphoric  Affect: Blunt   Thought Process  Thought Processes: Linear  Descriptions of Associations:Intact  Orientation:Full (Time, Place and Person)  Thought Content:Logical  History of Schizophrenia/Schizoaffective disorder:No  Duration of Psychotic Symptoms:N/A  Hallucinations:Hallucinations: None  Ideas of Reference:None  Suicidal Thoughts:Suicidal Thoughts: No SI Active Intent and/or Plan: Without Intent; Without Plan  Homicidal Thoughts:Homicidal Thoughts: No   Sensorium  Memory: Immediate Good; Recent Good; Remote Good  Judgment: Fair  Insight: Fair   Chartered certified accountant: Fair  Attention  Span: Fair  Recall: Good  Fund of Knowledge: Good  Language: Good   Psychomotor Activity  Psychomotor Activity: Psychomotor  Activity: Normal   Assets  Assets: Desire for Improvement; Resilience   Sleep  Sleep: Sleep: Fair    Physical Exam: Physical Exam Vitals reviewed.  Constitutional:      General: He is not in acute distress. HENT:     Head: Normocephalic.     Nose: Nose normal.  Pulmonary:     Effort: Pulmonary effort is normal. No respiratory distress.  Musculoskeletal:     Cervical back: Normal range of motion.  Neurological:     Mental Status: He is alert and oriented to person, place, and time.     Motor: No weakness.  Psychiatric:        Behavior: Behavior normal.        Judgment: Judgment normal.    Review of Systems  Constitutional:  Negative for fever and malaise/fatigue.  Cardiovascular:  Negative for chest pain and palpitations.  Neurological:  Negative for dizziness, tingling, tremors and headaches.  Psychiatric/Behavioral:  Positive for depression, substance abuse and suicidal ideas. Negative for hallucinations and memory loss. The patient is nervous/anxious. The patient does not have insomnia.   All other systems reviewed and are negative.  Blood pressure 114/80, pulse 90, temperature 98.4 F (36.9 C), temperature source Oral, resp. rate 16, height 5\' 9"  (1.753 m), weight 108.4 kg, SpO2 100%. Body mass index is 35.29 kg/m.   Treatment Plan Summary: Daily contact with patient to assess and evaluate symptoms and progress in treatment and Medication management    ASSESSMENT: MDD severe recurrent w/ psychotic features  GAD Alcohol use disorder  stimulant use disorder   PLAN: Safety and Monitoring:             --  Voluntary admission to inpatient psychiatric unit for safety, stabilization and treatment             -- Daily contact with patient to assess and evaluate symptoms and progress in treatment             -- Patient's  case to be discussed in multi-disciplinary team meeting             -- Observation Level : q15 minute checks             -- Vital signs:  q12 hours             -- Precautions: suicide, elopement, and assault   2. Psychiatric Diagnoses and Treatment:              Major depressive disorder, recurrent severe, without psychosis I Generalized anxiety disorder               -Continue Zoloft 75 mg daily for depression and anxiety. -Continue Seroquel 50 mg QHS for augmentation -Continue Agitation Protocol: Haldol/Ativan/Benadryl     Withdrawal: -Continue CIWA,  -Continue  Ativan 1 mg q6 PRN CIWA>10 -Continue Imodium 2-4 mg PRN diarrhea -Continue  Zofran-ODT 4 mg q6 PRN nausea -Continue Thiamine 100 mg daily for nutritional supplementation -Continue  Multivitamin daily for nutritional supplementation     Nicotine Dependence: -Continue Nicotine Patch 14 mg PRN daily -Smoking cessation encouraged   -Continue PRN's: Tylenol, Maalox, Atarax, Milk of Magnesia  --Discontinue PRN Trazodone due to daytime drowsiness.    -- Encouraged patient to participate in unit milieu and in scheduled group therapies              -- Short Term Goals: Ability to identify changes in lifestyle to reduce recurrence of condition will improve, Ability to verbalize feelings will  improve, Ability to disclose and discuss suicidal ideas, and Ability to identify and develop effective coping behaviors will improve             -- Long Term Goals: Improvement in symptoms so as ready for discharge                  Discharge Planning:              -- Social work and case management to assist with discharge planning and identification of hospital follow-up needs prior to discharge             -- Estimated LOS: 4-5 more days             -- Discharge Concerns: Need to establish a safety plan; Medication compliance and effectiveness             -- Discharge Goals: Return home with outpatient referrals for mental health  follow-up including medication management/psychotherapy    Norma Fredrickson, NP 06/04/2023, 12:18 PM  Total Time Spent in Direct Patient Care:  I personally spent 35 minutes on the unit in direct patient care. The direct patient care time included face-to-face time with the patient, reviewing the patient's chart, communicating with other professionals, and coordinating care. Greater than 50% of this time was spent in counseling or coordinating care with the patient regarding goals of hospitalization, psycho-education, and discharge planning needs.     Patient ID: Kirt Chew, male   DOB: 1980/10/27, 42 y.o.   MRN: [id number]

## 2023-06-04 NOTE — Progress Notes (Signed)
   06/04/23 2207  Psych Admission Type (Psych Patients Only)  Admission Status Voluntary  Psychosocial Assessment  Patient Complaints Anxiety  Eye Contact Brief  Facial Expression Anxious;Worried  Affect Anxious  Surveyor, minerals Activity Other (Comment) (WDL)  Appearance/Hygiene Unremarkable  Behavior Characteristics Cooperative  Mood Anxious  Thought Process  Coherency WDL  Content WDL  Delusions None reported or observed  Perception WDL  Hallucination None reported or observed  Judgment Impaired  Confusion None  Danger to Self  Current suicidal ideation? Denies  Self-Injurious Behavior No self-injurious ideation or behavior indicators observed or expressed   Agreement Not to Harm Self Yes  Description of Agreement verbal  Danger to Others  Danger to Others None reported or observed  Danger to Others Abnormal  Harmful Behavior to others No threats or harm toward other people  Destructive Behavior No threats or harm toward property

## 2023-06-04 NOTE — Progress Notes (Signed)
Brian Nolan is ambulatory in hallway and is appropriate He is calm cooperative and pleasant. Denies SI/HI/and AVH. Vital signs are stable and he voices no complaints currently

## 2023-06-04 NOTE — [facility] Group Notes (Signed)
[facility] Group Notes:  (Nursing/MHT/Case Management/Adjunct)  Date:  06/04/2023  Time:  9:20 PM  Type of Therapy:  The focus of this group is to help patients establish daily goals to achieve during treatment and discuss how the patient can incorporate goal setting into their daily lives to aide in recovery.   Participation Level:  Active  Participation Quality:  Appropriate  Affect:  Anxious and Appropriate  Cognitive:  Alert  Insight:  Appropriate, Good, and Improving  Engagement in Group:  Developing/Improving, Engaged, and Supportive  Modes of Intervention:  Socialization and Support  Summary of Progress/Problems: Pt attended group  Brian Nolan 06/04/2023, 9:20 PM

## 2023-06-04 NOTE — Plan of Care (Signed)
  Problem: Education: Goal: Knowledge of [facility] Education information/materials will improve Outcome: Progressing Goal: Emotional status will improve Outcome: Progressing Goal: Mental status will improve Outcome: Progressing Goal: Verbalization of understanding the information provided will improve Outcome: Progressing   

## 2023-06-05 DIAGNOSIS — F332 Major depressive disorder, recurrent severe without psychotic features: Secondary | ICD-10-CM | POA: Diagnosis not present

## 2023-06-05 LAB — RAPID URINE DRUG SCREEN, HOSP PERFORMED
Amphetamines: NOT DETECTED
Barbiturates: NOT DETECTED
Benzodiazepines: NOT DETECTED
Cocaine: POSITIVE — AB
Opiates: NOT DETECTED
Tetrahydrocannabinol: NOT DETECTED

## 2023-06-05 MED ORDER — PANTOPRAZOLE SODIUM 40 MG PO TBEC
40.0000 mg | DELAYED_RELEASE_TABLET | Freq: Every day | ORAL | Status: DC
  Administered 2023-06-05 – 2023-06-08 (×4): 40 mg via ORAL
  Filled 2023-06-05: qty 7
  Filled 2023-06-05 (×6): qty 1

## 2023-06-05 MED ORDER — TRAZODONE HCL 50 MG PO TABS
50.0000 mg | ORAL_TABLET | Freq: Every evening | ORAL | Status: DC | PRN
  Administered 2023-06-05 – 2023-06-07 (×3): 50 mg via ORAL
  Filled 2023-06-05 (×3): qty 1
  Filled 2023-06-05: qty 7

## 2023-06-05 NOTE — Group Note (Signed)
Recreation Therapy Group Note   Group Topic:Problem Solving  Group Date: 06/05/2023 Start Time: 0930 End Time: 0950 Facilitators: Ericha Whittingham-McCall, LRT,CTRS Location: [address] Dayroom   Group Topic: Communication, Team Building, Problem Solving  Goal Area(s) Addresses:  Patient will effectively work with peer towards shared goal.  Patient will identify skills used to make activity successful.  Patient will identify how skills used during activity can be applied to reach post d/c goals.   Intervention: STEM Activity- Glass blower/designer  Group Description: Tallest Pharmacist, community. In teams of 5-6, patients were given 11 craft pipe cleaners. Using the materials provided, patients were instructed to compete again the opposing team(s) to build the tallest free-standing structure from floor level. The activity was timed; difficulty increased by Clinical research associate as Production designer, theatre/television/film continued.  Systematically resources were removed with additional directions for example, placing one arm behind their back, working in silence, and shape stipulations. LRT facilitated post-activity discussion reviewing team processes and necessary communication skills involved in completion. Patients were encouraged to reflect how the skills utilized, or not utilized, in this activity can be incorporated to positively impact support systems post discharge.  Education: Pharmacist, community, Scientist, physiological, Discharge Planning   Education Outcome: Acknowledges education/In group clarification offered/Needs additional education.    Affect/Mood: N/A   Participation Level: Did not attend    Clinical Observations/Individualized Feedback:     Plan: Continue to engage patient in RT group sessions 2-3x/week.   Hanalei Glace-McCall, LRT,CTRS 06/05/2023 11:58 AM

## 2023-06-05 NOTE — Plan of Care (Signed)
  Problem: Education: Goal: Knowledge of [facility] Education information/materials will improve Outcome: Progressing Goal: Emotional status will improve Outcome: Progressing Goal: Mental status will improve Outcome: Progressing Goal: Verbalization of understanding the information provided will improve Outcome: Progressing   

## 2023-06-05 NOTE — Plan of Care (Signed)
  Problem: Education: Goal: Emotional status will improve Outcome: Progressing   Problem: Education: Goal: Mental status will improve Outcome: Progressing   Problem: Education: Goal: Verbalization of understanding the information provided will improve Outcome: Progressing   Problem: Education: Goal: Knowledge of  General Education information/materials will improve Outcome: Progressing   Problem: Activity: Goal: Interest or engagement in activities will improve Outcome: Progressing   Problem: Activity: Goal: Sleeping patterns will improve Outcome: Progressing

## 2023-06-05 NOTE — [facility] Group Notes (Signed)
[facility] Group Notes:  (Nursing/MHT/Case Management/Adjunct)  Date:  06/05/2023  Time:  8:27 PM  Type of Therapy:   Wrap up Group  Participation Level:  Active  Participation Quality:  Appropriate  Affect:  Appropriate  Cognitive:  Appropriate  Insight:  Appropriate  Engagement in Group:  Developing/Improving  Modes of Intervention:  Discussion and Support  Summary of Progress/Problems: Patient participated in [place]. Patient engaged in group appropriately.  Eldred Lievanos 06/05/2023, 8:27 PM

## 2023-06-05 NOTE — Group Note (Signed)
Date:  06/05/2023 Time:  10:13 AM  Group Topic/Focus:  Goals Group:   The focus of this group is to help patients establish daily goals to achieve during treatment and discuss how the patient can incorporate goal setting into their daily lives to aide in recovery.    Participation Level:  Active  Participation Quality:  Appropriate  Affect:  Appropriate  Cognitive:  Alert  Insight: Appropriate  Engagement in Group:  Engaged  Modes of Intervention:  Discussion  Additional Comments:  NA  Beckie Busing 06/05/2023, 10:13 AM
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
## 2023-06-05 NOTE — Progress Notes (Signed)
Brian Nolan is alert and appropriate and ambulating in hallway conversing with peers, He denies any SI/HI/ or AVH currently and exhibits no signs or symptoms of withdrawal CIWA scoring 0. No complaints voiced

## 2023-06-05 NOTE — Group Note (Deleted)
Date:  06/05/2023 Time:  10:41 AM  Group Topic/Focus:  Goals Group:   The focus of this group is to help patients establish daily goals to achieve during treatment and discuss how the patient can incorporate goal setting into their daily lives to aide in recovery.     Participation Level:  {BHH PARTICIPATION ZOXWR:60454}  Participation Quality:  {BHH PARTICIPATION QUALITY:22265}  Affect:  {BHH AFFECT:22266}  Cognitive:  {BHH COGNITIVE:22267}  Insight: {BHH Insight2:20797}  Engagement in Group:  {BHH ENGAGEMENT IN UJWJX:91478}  Modes of Intervention:  {BHH MODES OF INTERVENTION:22269}  Additional Comments:  ***  Beckie Busing 06/05/2023, 10:41 AM

## 2023-06-06 DIAGNOSIS — F332 Major depressive disorder, recurrent severe without psychotic features: Secondary | ICD-10-CM | POA: Diagnosis not present

## 2023-06-06 MED ORDER — SERTRALINE HCL 100 MG PO TABS
100.0000 mg | ORAL_TABLET | Freq: Every day | ORAL | Status: DC
  Administered 2023-06-07 – 2023-06-08 (×2): 100 mg via ORAL
  Filled 2023-06-06 (×2): qty 1
  Filled 2023-06-06: qty 7
  Filled 2023-06-06: qty 1

## 2023-06-06 MED ORDER — SERTRALINE HCL 25 MG PO TABS
25.0000 mg | ORAL_TABLET | Freq: Once | ORAL | Status: AC
  Administered 2023-06-06: 25 mg via ORAL
  Filled 2023-06-06: qty 1

## 2023-06-06 NOTE — Progress Notes (Addendum)
Patient presents with a flat affect but  was calm and  cooperative. Patient did not report any anxiety, depression, SI/HI or AVH. No pain reported as well.   Patients PM  scheduled medications administered per MD orders. Support and encouragement offered. Q15 mins checks carried out. Patient socializing with peers in front of his room.   Patient is able to vocalize needs. Care plan is being implemented as ordered. Will continue to monitor patient .

## 2023-06-06 NOTE — Plan of Care (Signed)
  Problem: Education: Goal: Knowledge of [facility] Education information/materials will improve 06/06/2023 2254 by Loreen Freud, RN Outcome: Progressing 06/06/2023 2238 by Loreen Freud, RN Outcome: Progressing Goal: Emotional status will improve 06/06/2023 2254 by Loreen Freud, RN Outcome: Progressing 06/06/2023 2238 by Loreen Freud, RN Outcome: Progressing Goal: Mental status will improve 06/06/2023 2254 by Loreen Freud, RN Outcome: Progressing 06/06/2023 2238 by Loreen Freud, RN Outcome: Progressing Goal: Verbalization of understanding the information provided will improve 06/06/2023 2254 by Loreen Freud, RN Outcome: Progressing 06/06/2023 2238 by Loreen Freud, RN Outcome: Progressing   Problem: Activity: Goal: Interest or engagement in activities will improve 06/06/2023 2254 by Loreen Freud, RN Outcome: Progressing 06/06/2023 2238 by Loreen Freud, RN Outcome: Progressing Goal: Sleeping patterns will improve 06/06/2023 2254 by Loreen Freud, RN Outcome: Progressing 06/06/2023 2238 by Loreen Freud, RN Outcome: Progressing   Problem: Coping: Goal: Ability to verbalize frustrations and anger appropriately will improve 06/06/2023 2254 by Loreen Freud, RN Outcome: Progressing 06/06/2023 2238 by Loreen Freud, RN Outcome: Progressing Goal: Ability to demonstrate self-control will improve 06/06/2023 2254 by Loreen Freud, RN Outcome: Progressing 06/06/2023 2238 by Loreen Freud, RN Outcome: Progressing   Problem: Health Behavior/Discharge Planning: Goal: Identification of resources available to assist in meeting health care needs will improve 06/06/2023 2254 by Loreen Freud, RN Outcome: Progressing 06/06/2023 2238 by Loreen Freud, RN Outcome: Progressing Goal: Compliance with treatment plan for underlying cause of condition will improve 06/06/2023 2254 by Loreen Freud, RN Outcome: Progressing 06/06/2023 2238 by Loreen Freud, RN Outcome:  Progressing   Problem: Physical Regulation: Goal: Ability to maintain clinical measurements within normal limits will improve 06/06/2023 2254 by Loreen Freud, RN Outcome: Progressing 06/06/2023 2238 by Loreen Freud, RN Outcome: Progressing   Problem: Safety: Goal: Periods of time without injury will increase 06/06/2023 2254 by Loreen Freud, RN Outcome: Progressing 06/06/2023 2238 by Loreen Freud, RN Outcome: Progressing

## 2023-06-06 NOTE — B[facility] Group Notes (Signed)
B[facility] Group Notes:  (Nursing/MHT/Case Management/Adjunct)  Date:  06/06/2023  Time:  9:13 PM  Type of Therapy:   Wrap-up group  Participation Level:  Active  Participation Quality:  Appropriate  Affect:  Appropriate  Cognitive:  Appropriate  Insight:  Appropriate  Engagement in Group:  Engaged  Modes of Intervention:  Education  Summary of Progress/Problems: Pt goal to feel better. Pt reports he is feeling better. Pt rated his day 8/10.  Noah Delaine 06/06/2023, 9:13 PM

## 2023-06-06 NOTE — Progress Notes (Signed)
[facility] MD Progress Note  06/06/2023 7:03 AM Brian Nolan Thomes Dinning  MRN:  [id number] Subjective:   Brian Nolan A. Hidrogo is a 42 yr old male who presented on 9/29 to [facility] with depression and SI with a plan (jump off a bridge), he was admitted to [facility] on 9/30. PPHx is significant for MDD, GAD, and Polysubstance Abuse (EtOH, Cocaine), and Prior Suicide Attempt (10/2021-held gun to head and pulled trigger), a remote history of Self Injurious Behavior (Cutting-last 2014), and Multiple Prior Psychiatric Hospitalizations (last- [facility] 03/2022).    Case was discussed in the multidisciplinary team. MAR was reviewed and patient was compliant with medications. He received PRN Maalox, Hydroxyzine, and Trazodone yesterday.   Psychiatric Team made the following recommendations yesterday: -Continue Zoloft 75 mg daily for depression and anxiety. -Continue Seroquel 50 mg QHS for augmentation -Start Protonix EC 40 mg daily    On interview today patient reports he slept good last night with the Trazodone.  He reports his appetite is doing fair.  He reports no SI, HI, or AVH.  He reports no Paranoia or Ideas of Reference.  He reports no issues with his medications.  He reports that his mood has improved but he does continue to have some depression.  Discussed further increasing his Zoloft to address this and also make taking his medication less complex and he was agreeable with this.  He reports that he is looking forward to going to his new job on Monday.  Reinforced with him that avoiding his ex-girlfriend would be the best idea.  He reports no other concerns present.  Principal Problem: MDD (major depressive disorder), recurrent severe, without psychosis (HCC) Diagnosis: Principal Problem:   MDD (major depressive disorder), recurrent severe, without psychosis (HCC) Active Problems:   Polysubstance abuse (HCC)   GAD (generalized anxiety disorder)  Total Time spent with patient:  I personally spent 35  minutes on the unit in direct patient care. The direct patient care time included face-to-face time with the patient, reviewing the patient's chart, communicating with other professionals, and coordinating care. Greater than 50% of this time was spent in counseling or coordinating care with the patient regarding goals of hospitalization, psycho-education, and discharge planning needs.   Past Psychiatric History: MDD, GAD, and Polysubstance Abuse (EtOH, Cocaine), and Prior Suicide Attempt (10/2021-held gun to head and pulled trigger), a remote history of Self Injurious Behavior (Cutting-last 2014), and Multiple Prior Psychiatric Hospitalizations (last- [facility] 03/2022).   Past Medical History:  Past Medical History:  Diagnosis Date   Depression    History of hiatal hernia    Schizophrenia (HCC)    Substance abuse (HCC)     Past Surgical History:  Procedure Laterality Date   NO PAST SURGERIES     Family History:  Family History  Problem Relation Age of Onset   Schizophrenia Maternal Uncle    Diabetes Mellitus II Father    Family Psychiatric  History:  Mother- EtOH Abuse Father- Polysubstance Abuse Maternal Uncle- Schizophrenia, Bipolar Disorder, Polysubstance Abuse No Known Suicides  Social History:  Social History   Substance and Sexual Activity  Alcohol Use Yes   Alcohol/week: 6.0 - 24.0 standard drinks of alcohol   Types: 6 - 24 Cans of beer per week   Comment: 12 ppd     Social History   Substance and Sexual Activity  Drug Use Yes   Types: "Crack" cocaine, Marijuana, Cocaine    Social History   Socioeconomic History   Marital status: Single  Spouse name: Not on file   Number of children: Not on file   Years of education: Not on file   Highest education level: Not on file  Occupational History   Not on file  Tobacco Use   Smoking status: Every Day    Current packs/day: 1.00    Average packs/day: 1 pack/day for 14.3 years (14.3 ttl pk-yrs)    Types:  Cigarettes    Start date: 01/31/2008    Last attempt to quit: 01/30/2018    Passive exposure: Never   Smokeless tobacco: Never  Vaping Use   Vaping status: Former  Substance and Sexual Activity   Alcohol use: Yes    Alcohol/week: 6.0 - 24.0 standard drinks of alcohol    Types: 6 - 24 Cans of beer per week    Comment: 12 ppd   Drug use: Yes    Types: "Crack" cocaine, Marijuana, Cocaine   Sexual activity: Yes    Birth control/protection: Condom  Other Topics Concern   Not on file  Social History Narrative   Not on file   Social Determinants of Health   Financial Resource Strain: Not on file  Food Insecurity: No Food Insecurity (06/01/2023)   Hunger Vital Sign    Worried About Running Out of Food in the Last Year: Never true    Ran Out of Food in the Last Year: Never true  Transportation Needs: No Transportation Needs (06/01/2023)   PRAPARE - Administrator, Civil Service (Medical): No    Lack of Transportation (Non-Medical): No  Physical Activity: Not on file  Stress: Not on file  Social Connections: Not on file   Additional Social History:                         Sleep: Good  Appetite:  Fair  Current Medications: Current Facility-Administered Medications  Medication Dose Route Frequency Provider Last Rate Last Admin   acetaminophen (TYLENOL) tablet 650 mg  650 mg Oral Q6H PRN Motley-Mangrum, Jadeka A, PMHNP       alum & mag hydroxide-simeth (MAALOX/MYLANTA) 200-200-20 MG/5ML suspension 30 mL  30 mL Oral Q4H PRN Motley-Mangrum, Jadeka A, PMHNP   30 mL at 06/05/23 1506   diphenhydrAMINE (BENADRYL) capsule 50 mg  50 mg Oral TID PRN Motley-Mangrum, Jadeka A, PMHNP       Or   diphenhydrAMINE (BENADRYL) injection 50 mg  50 mg Intramuscular TID PRN Motley-Mangrum, Jadeka A, PMHNP       haloperidol (HALDOL) tablet 5 mg  5 mg Oral TID PRN Motley-Mangrum, Jadeka A, PMHNP       Or   haloperidol lactate (HALDOL) injection 5 mg  5 mg Intramuscular TID PRN  Motley-Mangrum, Jadeka A, PMHNP       hydrOXYzine (ATARAX) tablet 25 mg  25 mg Oral TID PRN Motley-Mangrum, Jadeka A, PMHNP   25 mg at 06/05/23 2112   LORazepam (ATIVAN) tablet 2 mg  2 mg Oral TID PRN Motley-Mangrum, Jadeka A, PMHNP       Or   LORazepam (ATIVAN) injection 2 mg  2 mg Intramuscular TID PRN Motley-Mangrum, Jadeka A, PMHNP       magnesium hydroxide (MILK OF MAGNESIA) suspension 30 mL  30 mL Oral Daily PRN Motley-Mangrum, Jadeka A, PMHNP       multivitamin with minerals tablet 1 tablet  1 tablet Oral Daily Lauro Franklin, MD   1 tablet at 06/05/23 2956   nicotine (NICODERM CQ -  dosed in mg/24 hours) patch 14 mg  14 mg Transdermal Daily PRN Lauro Franklin, MD       pantoprazole (PROTONIX) EC tablet 40 mg  40 mg Oral Daily Massengill, Nathan, MD   40 mg at 06/05/23 1640   QUEtiapine (SEROQUEL) tablet 50 mg  50 mg Oral QHS Motley-Mangrum, Jadeka A, PMHNP   50 mg at 06/05/23 2112   sertraline (ZOLOFT) tablet 75 mg  75 mg Oral Daily Massengill, Harrold Donath, MD   75 mg at 06/05/23 1610   thiamine (Vitamin B-1) tablet 100 mg  100 mg Oral Daily Lauro Franklin, MD   100 mg at 06/05/23 9604   traZODone (DESYREL) tablet 50 mg  50 mg Oral QHS PRN Phineas Inches, MD   50 mg at 06/05/23 2112    Lab Results:  Results for orders placed or performed during the hospital encounter of 06/01/23 (from the past 48 hour(s))  Rapid urine drug screen (hospital performed)     Status: Abnormal   Collection Time: 06/05/23  4:01 PM  Result Value Ref Range   Opiates NONE DETECTED NONE DETECTED   Cocaine POSITIVE (A) NONE DETECTED   Benzodiazepines NONE DETECTED NONE DETECTED   Amphetamines NONE DETECTED NONE DETECTED   Tetrahydrocannabinol NONE DETECTED NONE DETECTED   Barbiturates NONE DETECTED NONE DETECTED    Comment: (NOTE) DRUG SCREEN FOR MEDICAL PURPOSES ONLY.  IF CONFIRMATION IS NEEDED FOR ANY PURPOSE, NOTIFY LAB WITHIN 5 DAYS.  LOWEST DETECTABLE LIMITS FOR URINE DRUG  SCREEN Drug Class                     Cutoff (ng/mL) Amphetamine and metabolites    1000 Barbiturate and metabolites    200 Benzodiazepine                 200 Opiates and metabolites        300 Cocaine and metabolites        300 THC                            50 Performed at [facility], [address], [place], [place] [address]     Blood Alcohol level:  Lab Results  Component Value Date   ETH <10 05/31/2023   ETH <10 03/11/2022    Metabolic Disorder Labs: Lab Results  Component Value Date   HGBA1C 5.7 (H) 05/31/2023   MPG 116.89 05/31/2023   MPG 99.67 11/16/2021   No results found for: "PROLACTIN" Lab Results  Component Value Date   CHOL 185 06/02/2023   TRIG 443 (H) 06/02/2023   HDL 41 06/02/2023   CHOLHDL 4.5 06/02/2023   VLDL UNABLE TO CALCULATE IF TRIGLYCERIDE OVER 400 mg/dL 11/91/4782   LDLCALC UNABLE TO CALCULATE IF TRIGLYCERIDE OVER 400 mg/dL 95/62/1308   LDLCALC 89 11/16/2021    Physical Findings: AIMS: Facial and Oral Movements Muscles of Facial Expression: None, normal Lips and Perioral Area: None, normal Jaw: None, normal Tongue: None, normal,Extremity Movements Upper (arms, wrists, hands, fingers): None, normal Lower (legs, knees, ankles, toes): None, normal, Trunk Movements Neck, shoulders, hips: None, normal, Overall Severity Severity of abnormal movements (highest score from questions above): None, normal Incapacitation due to abnormal movements: None, normal Patient's awareness of abnormal movements (rate only patient's report): No Awareness, Dental Status Current problems with teeth and/or dentures?: No Does patient usually wear dentures?: No  CIWA:  CIWA-Ar Total: 0 COWS:  Musculoskeletal: Strength & Muscle Tone: within normal limits Gait & Station: normal Patient leans: N/A  Psychiatric Specialty Exam:  Presentation  General Appearance:  Fairly Groomed  Eye Contact: Fair  Speech: Normal Rate  Speech  Volume: Normal  Handedness: Right   Mood and Affect  Mood: Anxious; Dysphoric  Affect: Blunt   Thought Process  Thought Processes: Linear  Descriptions of Associations:Intact  Orientation:Full (Time, Place and Person)  Thought Content:Logical  History of Schizophrenia/Schizoaffective disorder:No  Duration of Psychotic Symptoms:N/A  Hallucinations:No data recorded Ideas of Reference:None  Suicidal Thoughts:No data recorded Homicidal Thoughts:No data recorded  Sensorium  Memory: Immediate Good; Recent Good; Remote Good  Judgment: Fair  Insight: Fair   Art therapist  Concentration: Fair  Attention Span: Fair  Recall: Good  Fund of Knowledge: Good  Language: Good   Psychomotor Activity  Psychomotor Activity:No data recorded  Assets  Assets: Desire for Improvement; Resilience   Sleep  Sleep:No data recorded   Physical Exam: Physical Exam Vitals and nursing note reviewed.  Constitutional:      General: He is not in acute distress.    Appearance: Normal appearance. He is obese. He is not ill-appearing or toxic-appearing.  HENT:     Head: Normocephalic and atraumatic.  Pulmonary:     Effort: Pulmonary effort is normal.  Musculoskeletal:        General: Normal range of motion.  Neurological:     General: No focal deficit present.     Mental Status: He is alert.    Review of Systems  Respiratory:  Negative for cough and shortness of breath.   Cardiovascular:  Negative for chest pain.  Gastrointestinal:  Negative for abdominal pain, constipation, diarrhea, nausea and vomiting.  Neurological:  Negative for dizziness, weakness and headaches.  Psychiatric/Behavioral:  Positive for depression. Negative for hallucinations and suicidal ideas. The patient is not nervous/anxious and does not have insomnia.    Blood pressure 119/89, pulse 98, temperature 98.5 F (36.9 C), temperature source Oral, resp. rate 18, height 5\' 9"  (1.753  m), weight 108.4 kg, SpO2 100%. Body mass index is 35.29 kg/m.   Treatment Plan Summary: Daily contact with patient to assess and evaluate symptoms and progress in treatment and Medication management  Yuriy A. Hosea is a 43 yr old male who presented on 9/29 to [facility] with depression and SI with a plan (jump off a bridge), he was admitted to [facility] on 9/30. PPHx is significant for MDD, GAD, and Polysubstance Abuse (EtOH, Cocaine), and Prior Suicide Attempt (10/2021-held gun to head and pulled trigger), a remote history of Self Injurious Behavior (Cutting-last 2014), and Multiple Prior Psychiatric Hospitalizations (last- [facility] 03/2022).    Eivan is reporting improvement but still having some depression.  To address this we will increase his Zoloft to 100 mg.  We will still plan for discharge Monday morning.  We will not make any other changes to his medication at this time.  We will continue to monitor.   MDD, Recurrent. Severe, w/out Psychosis  GAD: -Increase Zoloft to 100 mg daily for depression and anxiety. -Continue Seroquel 50 mg QHS for augmentation -Continue Agitation Protocol: Haldol/Ativan/Benadryl   Nicotine Dependence: -Continue Nicotine Patch 14 mg PRN daily     -Continue Thiamine 100 mg daily for nutritional supplementation -Continue Multivitamin daily for nutritional supplementation -Continue Protonix EC 40 mg daily -Continue PRN's: Tylenol, Maalox, Atarax, Milk of Magnesia, Trazodone   Lauro Franklin, MD 06/06/2023, 7:03 AM

## 2023-06-06 NOTE — Progress Notes (Signed)
Patient ID: Brian Nolan, male   DOB: 08-10-1981, 42 y.o.   MRN: [id number] Pt presents with pleasant mood, affect congruent. Brian Nolan reports is doing '' so much better. '' And reports he is looking forward to discharge. Pt denies any SI HI or av hallucinations. He has been visible in the milieu and attending programming. He is smiling on the unit with peers and staff and appears in no acute distress. He has been eating and drinking well and compliant with all medications. Pt completed self inventory form and rates his depression, hopelessness and anxiety all at 0/10 on scale and 10 being worst 0 being none. Pt states his goal is to ''restart. '' And to '' pray and focus. ''  Pt is safe, above discussed with treatment team including MD, NP and SW. Will con't to monitor.

## 2023-06-06 NOTE — Plan of Care (Signed)

## 2023-06-06 NOTE — Plan of Care (Signed)

## 2023-06-06 NOTE — Progress Notes (Signed)
   06/05/23 2210  Psych Admission Type (Psych Patients Only)  Admission Status Voluntary  Psychosocial Assessment  Patient Complaints Anxiety;Depression  Eye Contact Fair  Facial Expression Anxious  Affect Anxious  Speech Logical/coherent  Interaction Assertive  Motor Activity Slow  Appearance/Hygiene Unremarkable  Behavior Characteristics Appropriate to situation  Mood Pleasant  Thought Process  Coherency WDL  Content WDL  Delusions None reported or observed  Perception WDL  Hallucination None reported or observed  Judgment Poor  Confusion WDL  Danger to Self  Current suicidal ideation? Denies  Self-Injurious Behavior No self-injurious ideation or behavior indicators observed or expressed   Agreement Not to Harm Self Yes  Description of Agreement verbal  Danger to Others  Danger to Others None reported or observed  Danger to Others Abnormal  Harmful Behavior to others No threats or harm toward other people  Destructive Behavior No threats or harm toward property

## 2023-06-06 NOTE — Group Note (Signed)
Date:  06/06/2023 Time:  5:22 PM  Group Topic/Focus:  Wellness Toolbox:   The focus of this group is to discuss various aspects of wellness, balancing those aspects and exploring ways to increase the ability to experience wellness.  Patients will create a wellness toolbox for use upon discharge.    Participation Level:  Active  Participation Quality:  Appropriate  Affect:  Appropriate  Cognitive:  Appropriate  Insight: Appropriate  Engagement in Group:  Engaged  Modes of Intervention:  Exploration   Additional Comments:     Reymundo Poll 06/06/2023, 5:22 PM

## 2023-06-06 NOTE — Group Note (Signed)
Date:  06/06/2023 Time:  10:33 AM  Group Topic/Focus:  Goals Group:   The focus of this group is to help patients establish daily goals to achieve during treatment and discuss how the patient can incorporate goal setting into their daily lives to aide in recovery.    Participation Level:  Active  Participation Quality:  Appropriate  Affect:  Appropriate  Cognitive:  Appropriate  Insight: Appropriate  Engagement in Group:  Engaged  Modes of Intervention:  Discussion  Additional Comments:     Reymundo Poll 06/06/2023, 10:33 AM

## 2023-06-07 DIAGNOSIS — F332 Major depressive disorder, recurrent severe without psychotic features: Secondary | ICD-10-CM | POA: Diagnosis not present

## 2023-06-07 LAB — RAPID URINE DRUG SCREEN, HOSP PERFORMED
Amphetamines: NOT DETECTED
Barbiturates: NOT DETECTED
Benzodiazepines: NOT DETECTED
Cocaine: NOT DETECTED
Opiates: NOT DETECTED
Tetrahydrocannabinol: NOT DETECTED

## 2023-06-07 MED ORDER — PANTOPRAZOLE SODIUM 40 MG PO TBEC
40.0000 mg | DELAYED_RELEASE_TABLET | Freq: Every day | ORAL | 0 refills | Status: DC
  Filled 2023-06-17: qty 30, 30d supply, fill #0

## 2023-06-07 MED ORDER — HYDROXYZINE HCL 25 MG PO TABS
25.0000 mg | ORAL_TABLET | Freq: Three times a day (TID) | ORAL | 0 refills | Status: DC | PRN
  Filled 2023-06-17: qty 30, 10d supply, fill #0

## 2023-06-07 MED ORDER — QUETIAPINE FUMARATE 50 MG PO TABS
50.0000 mg | ORAL_TABLET | Freq: Every day | ORAL | 0 refills | Status: DC
  Filled 2023-06-17: qty 30, 30d supply, fill #0

## 2023-06-07 MED ORDER — TRAZODONE HCL 50 MG PO TABS: 50.0000 mg | ORAL_TABLET | Freq: Every evening | ORAL | 0 refills | Status: DC | PRN

## 2023-06-07 MED ORDER — NICOTINE 14 MG/24HR TD PT24
14.0000 mg | MEDICATED_PATCH | Freq: Every day | TRANSDERMAL | 0 refills | Status: DC | PRN
  Filled 2023-06-17: qty 28, 28d supply, fill #0

## 2023-06-07 MED ORDER — SERTRALINE HCL 100 MG PO TABS
100.0000 mg | ORAL_TABLET | Freq: Every day | ORAL | 0 refills | Status: DC
  Filled 2023-06-17: qty 30, 30d supply, fill #0

## 2023-06-07 NOTE — Progress Notes (Signed)
D) Pt received calm, visible, participating in milieu, and in no acute distress. Pt A & O x4. Pt denies SI, HI, A/ V H, depression, anxiety and pain at this time. A) Pt encouraged to drink fluids. Pt encouraged to come to staff with needs. Pt encouraged to attend and participate in groups. Pt encouraged to set reachable goals.  R) Pt remained safe on unit, in no acute distress, will continue to assess.    Pt reporting anxiety and insomnia r/t awaiting lab results. PRN meds provided, as well as lab results when available. Pt excited for upcoming discharge.     06/07/23 2100  Psychosocial Assessment  Patient Complaints Anxiety;Insomnia  Eye Contact Fair  Facial Expression Anxious  Affect Anxious  Speech Logical/coherent  Interaction Assertive  Motor Activity Restless  Appearance/Hygiene Improved  Behavior Characteristics Cooperative  Mood Pleasant  Thought Process  Coherency WDL  Content WDL  Delusions None reported or observed  Perception WDL  Hallucination None reported or observed  Judgment WDL  Confusion None  Danger to Self  Current suicidal ideation? Denies  Agreement Not to Harm Self Yes  Description of Agreement verbal  Danger to Others  Danger to Others None reported or observed

## 2023-06-07 NOTE — [facility] Group Notes (Signed)
Type of Therapy and Topic:  Group Therapy: Gratitude  Participation Level:  Active   Description of Group:   In this group, patients shared and discussed the importance of acknowledging the elements in their lives for which they are grateful and how this can positively impact their mood.  The group discussed how bringing the positive elements of their lives to the forefront of their minds can help with recovery from any illness, physical or mental.  An exercise was done as a group in which a list was made of gratitude items in order to encourage participants to consider other potential positives in their lives.  Therapeutic Goals: Patients will identify one or more item for which they are grateful in each of 6 categories:  people, experiences, things, places, skills, and other. Patients will discuss how it is possible to seek out gratitude in even bad situations. Patients will explore other possible items of gratitude that they could remember.   Summary of Patient Progress:  The patient shared that he is grateful for his family because they are supportive.  Patient's reaction to the group was thankful for the group that will validate safe discharge.  Therapeutic Modalities:   Solution-Focused Therapy Activity

## 2023-06-07 NOTE — Group Note (Signed)
Date:  06/07/2023 Time:  2:03 PM  Group Topic/Focus:  Goals Group:   The focus of this group is to help patients establish daily goals to achieve during treatment and discuss how the patient can incorporate goal setting into their daily lives to aide in recovery.    Participation Level:  Active  Participation Quality:  Appropriate  Affect:  Appropriate  Cognitive:  Appropriate  Insight: Good  Engagement in Group:  Engaged  Modes of Intervention:  Discussion  Additional Comments:    Beckie Busing 06/07/2023, 2:03 PM

## 2023-06-07 NOTE — Progress Notes (Signed)
   06/07/23 0530  15 Minute Checks  Location Bedroom  Visual Appearance Calm  Behavior Sleeping  Sleep (Behavioral Health Patients Only)  Calculate sleep? (Click Yes once per 24 hr at 0600 safety check) Yes  Documented sleep last 24 hours 8.5

## 2023-06-07 NOTE — Progress Notes (Addendum)
Patient ID: Brian Nolan, male   DOB: [birth year], 42 y.o.   MRN: [id number] Patient presents with pleasant mood affect congruent. [name] reports he is doing well and feels his mood has improved and he is feeling '' ready to go in the morning'' Pt does continue to report some anxiety over worrying about his urine drug screen results before being able to go. Pt denies any SI HI or AV Hallucinations. Pt has been visible on the unit, attending programming and eating and drinking well. He is able to make his needs known. Pt did provide new UDS and the sample was placed in lab refrigerator. Pt completed self inventory and rates his depression, anxiety and hopelessness all at 0/10 on scale 10 being worst 0 being none. Pt states his goal is '' building my confidence and assure myself I can do anything.,''  Pt is safe, will con't to monitor.

## 2023-06-07 NOTE — [facility] Suicide Risk Assessment (Signed)
[facility] Discharge Suicide Risk Assessment   Principal Problem: MDD (major depressive disorder), recurrent severe, without psychosis (HCC) Discharge Diagnoses: Principal Problem:   MDD (major depressive disorder), recurrent severe, without psychosis (HCC) Active Problems:   Polysubstance abuse (HCC)   GAD (generalized anxiety disorder)  During the patient's hospitalization, patient had extensive initial psychiatric evaluation, and follow-up psychiatric evaluations every day.  Psychiatric diagnoses provided upon initial assessment: MDD, Recurrent, Severe, w/out Psychosis, GAD, and Polysubstance Abuse (EtOH, Cocaine) .  Patient's psychiatric medications were adjusted on admission: Continued on the Zoloft and Seroquel restarted in the ED.  During the hospitalization, other adjustments were made to the patient's psychiatric medication regimen: His Zoloft was titrated.  Gradually, patient started adjusting to milieu.   Patient's care was discussed during the interdisciplinary team meeting every day during the hospitalization.  The patient is not having side effects to prescribed psychiatric medication.  The patient reports their target psychiatric symptoms of depression and SI responded well to the psychiatric medications, and the patient reports overall benefit other psychiatric hospitalization. Supportive psychotherapy was provided to the patient. The patient also participated in regular group therapy while admitted.   Labs were reviewed with the patient, and abnormal results were discussed with the patient.  The patient denied having suicidal thoughts more than 48 hours prior to discharge.  Patient denies having homicidal thoughts.  Patient denies having auditory hallucinations.  Patient denies any visual hallucinations.  Patient denies having paranoid thoughts.  The patient is able to verbalize their individual safety plan to this provider.  It is recommended to the patient to continue  psychiatric medications as prescribed, after discharge from the hospital.    It is recommended to the patient to follow up with your outpatient psychiatric provider and PCP.  Discussed with the patient, the impact of alcohol, drugs, tobacco have been there overall psychiatric and medical wellbeing, and total abstinence from substance use was recommended the patient.  Total Time spent with patient: 20 minutes  Musculoskeletal: Strength & Muscle Tone: within normal limits Gait & Station: normal Patient leans: N/A  Psychiatric Specialty Exam  Presentation  General Appearance:  Appropriate for Environment; Casual  Eye Contact: Good  Speech: Clear and Coherent; Normal Rate  Speech Volume: Normal  Handedness: Right   Mood and Affect  Mood: -- ("better")  Duration of Depression Symptoms: Greater than two weeks  Affect: Appropriate; Congruent   Thought Process  Thought Processes: Coherent; Goal Directed  Descriptions of Associations:Intact  Orientation:Full (Time, Place and Person)  Thought Content:Logical; WDL  History of Schizophrenia/Schizoaffective disorder:No  Duration of Psychotic Symptoms:N/A  Hallucinations:Hallucinations: None  Ideas of Reference:None  Suicidal Thoughts:Suicidal Thoughts: No  Homicidal Thoughts:Homicidal Thoughts: No   Sensorium  Memory: Immediate Good; Recent Good  Judgment: Good  Insight: Good   Executive Functions  Concentration: Good  Attention Span: Good  Recall: Good  Fund of Knowledge: Good  Language: Good   Psychomotor Activity  Psychomotor Activity: Psychomotor Activity: Normal   Assets  Assets: Communication Skills; Desire for Improvement; Resilience   Sleep  Sleep: Sleep: Good Number of Hours of Sleep: 8.5   Physical Exam: Physical Exam Vitals and nursing note reviewed.  Constitutional:      General: He is not in acute distress.    Appearance: Normal appearance. He is normal  weight. He is not ill-appearing or toxic-appearing.  HENT:     Head: Normocephalic and atraumatic.  Pulmonary:     Effort: Pulmonary effort is normal.  Musculoskeletal:  General: Normal range of motion.  Neurological:     General: No focal deficit present.     Mental Status: He is alert.    Review of Systems  Respiratory:  Negative for cough and shortness of breath.   Cardiovascular:  Negative for chest pain.  Gastrointestinal:  Negative for abdominal pain, constipation, diarrhea, nausea and vomiting.  Neurological:  Negative for dizziness, weakness and headaches.  Psychiatric/Behavioral:  Negative for depression, hallucinations and suicidal ideas. The patient is not nervous/anxious.    Blood pressure 121/84, pulse 98, temperature 98.9 F (37.2 C), temperature source Oral, resp. rate 18, height 5\' 9"  (1.753 m), weight 108.4 kg, SpO2 99%. Body mass index is 35.29 kg/m.  Mental Status Per Nursing Assessment::   On Admission:  Suicidal ideation indicated by patient  Demographic Factors:  Male, Low socioeconomic status, and Living alone  Loss Factors: Loss of significant relationship  Historical Factors: Prior suicide attempts and Impulsivity  Risk Reduction Factors:   Employed, Positive therapeutic relationship, and Positive coping skills or problem solving skills  Continued Clinical Symptoms:  More than one psychiatric diagnosis Previous Psychiatric Diagnoses and Treatments  Cognitive Features That Contribute To Risk:  None    Suicide Risk:  Mild:  No Suicidal ideation.  There are no identifiable plans, no associated intent, mild dysphoria and related symptoms, good self-control (both objective and subjective assessment), few other risk factors, and identifiable protective factors, including available and accessible social support.  However, due to his history of a Prior Suicide Attempt there is some chronic risk present.   Follow-up Information     [facility]. Go on 07/06/2023.   Specialty: Behavioral Health Why: You have an appointment for medication management services on 07/06/23 at 11:00 am, Virtual via MyChart.  You also have an appointment for therapy services on 07/07/23 at 10:00 am, Virtual video via textlink to your phone.  * Please follow instructions texted to you, to update your MyChart account.  YOU MUST CONFIRM APPTS TO KEEP * Contact information: [address] [place] [address] [phone]                Plan Of Care/Follow-up recommendations:  Activity: as tolerated  Diet: heart healthy  Other: -Follow-up with your outpatient psychiatric provider -instructions on appointment date, time, and address (location) are provided to you in discharge paperwork.  -Take your psychiatric medications as prescribed at discharge - instructions are provided to you in the discharge paperwork  -Follow-up with outpatient primary care doctor and other specialists -for management of chronic medical disease, including: Elevated Triglyceride level.  A list of PCP's will be provided to establish with one.  -Testing: Follow-up with outpatient provider for abnormal lab results: Elevated Triglyceride level.  -Recommend abstinence from alcohol, tobacco, and other illicit drug use at discharge.   -If your psychiatric symptoms recur, worsen, or if you have side effects to your psychiatric medications, call your outpatient psychiatric provider, 911, 988 or go to the nearest emergency department.  -If suicidal thoughts recur, call your outpatient psychiatric provider, 911, 988 or go to the nearest emergency department.   Lauro Franklin, MD 06/07/2023, 3:07 PM

## 2023-06-07 NOTE — [facility] Group Notes (Signed)
[facility] Group Notes:  (Nursing/MHT/Case Management/Adjunct)  Date:  06/07/2023  Time:  4:09 PM  Type of Therapy:  Psychoeducational Skills  Participation Level:  Did Not Attend  Participation Quality:  na    Summary of Progress/Problems: Did not attend afternoon RN GROUP.   Malva Limes 06/07/2023, 4:09 PM
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
## 2023-06-07 NOTE — Group Note (Signed)
Date:  06/07/2023 Time:  11:38 PM  Group Topic/Focus:  Wrap-Up Group:   The focus of this group is to help patients review their daily goal of treatment and discuss progress on daily workbooks.    Participation Level:  Active  Participation Quality:  Appropriate and Sharing  Affect:  Appropriate  Cognitive:  Appropriate  Insight: Appropriate  Engagement in Group:  Engaged  Modes of Intervention:  Activity and Socialization  Additional Comments:  The patient stated that he had a good day. 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 The patient rated his a 9/10. The patient participated in the group activity at the end of the group.   Brian Nolan 06/07/2023, 11:38 PM

## 2023-06-07 NOTE — Plan of Care (Signed)

## 2023-06-08 NOTE — Progress Notes (Signed)
Pt came to writer explaining that he needed to be at the bus stop by 8 AM. Discharge was already set up and labs resulted Pt provided voucher and cab called and arrived, all pt belongings returned and AVS printed and provided. Pt AM medications  given and pt discharged to cab to [place].

## 2023-06-17 ENCOUNTER — Other Ambulatory Visit: Payer: Self-pay

## 2023-06-17 MED ORDER — TRAZODONE HCL 50 MG PO TABS
50.0000 mg | ORAL_TABLET | Freq: Every evening | ORAL | 0 refills | Status: DC | PRN
  Filled 2023-06-17: qty 15, 15d supply, fill #0

## 2023-07-06 ENCOUNTER — Telehealth (HOSPITAL_COMMUNITY): Payer: Self-pay

## 2023-07-06 ENCOUNTER — Other Ambulatory Visit: Payer: Self-pay

## 2023-07-06 ENCOUNTER — Encounter (HOSPITAL_COMMUNITY): Payer: Self-pay | Admitting: Psychiatry

## 2023-07-06 ENCOUNTER — Telehealth (INDEPENDENT_AMBULATORY_CARE_PROVIDER_SITE_OTHER): Payer: No Payment, Other | Admitting: Psychiatry

## 2023-07-06 DIAGNOSIS — Z0000 Encounter for general adult medical examination without abnormal findings: Secondary | ICD-10-CM

## 2023-07-06 DIAGNOSIS — F1994 Other psychoactive substance use, unspecified with psychoactive substance-induced mood disorder: Secondary | ICD-10-CM | POA: Diagnosis not present

## 2023-07-06 DIAGNOSIS — F17200 Nicotine dependence, unspecified, uncomplicated: Secondary | ICD-10-CM

## 2023-07-06 DIAGNOSIS — F411 Generalized anxiety disorder: Secondary | ICD-10-CM

## 2023-07-06 MED ORDER — TRAZODONE HCL 50 MG PO TABS
50.0000 mg | ORAL_TABLET | Freq: Every evening | ORAL | 3 refills | Status: DC | PRN
  Filled 2023-07-06: qty 30, 30d supply, fill #0

## 2023-07-06 MED ORDER — NICOTINE 14 MG/24HR TD PT24
14.0000 mg | MEDICATED_PATCH | Freq: Every day | TRANSDERMAL | 3 refills | Status: DC | PRN
  Filled 2023-07-06: qty 42, 42d supply, fill #0

## 2023-07-06 MED ORDER — PANTOPRAZOLE SODIUM 40 MG PO TBEC
40.0000 mg | DELAYED_RELEASE_TABLET | Freq: Every day | ORAL | 3 refills | Status: DC
  Filled 2023-07-06: qty 30, 30d supply, fill #0

## 2023-07-06 MED ORDER — QUETIAPINE FUMARATE 50 MG PO TABS
50.0000 mg | ORAL_TABLET | Freq: Every day | ORAL | 3 refills | Status: DC
  Filled 2023-07-06: qty 30, 30d supply, fill #0

## 2023-07-06 MED ORDER — HYDROXYZINE HCL 25 MG PO TABS
25.0000 mg | ORAL_TABLET | Freq: Three times a day (TID) | ORAL | 3 refills | Status: DC | PRN
  Filled 2023-07-06: qty 90, 30d supply, fill #0

## 2023-07-06 MED ORDER — SERTRALINE HCL 100 MG PO TABS
100.0000 mg | ORAL_TABLET | Freq: Every day | ORAL | 3 refills | Status: DC
  Filled 2023-07-06: qty 30, 30d supply, fill #0

## 2023-07-06 NOTE — Telephone Encounter (Signed)
Brian Cookey, DNP referred this patient to [facility]. This therapist contacted Brian Nolan via telephone and confirmed she was speaking to the correct person by obtaining two identifiers.  This therapist inquired if he was interested in substance use services and he said he was.  Therapist offered him an appointment of 3pm on tomorrow, 07-07-23.  Brian Nolan said he needs to be seen in the evening hours and would like to be in a group.  Therapist explains that [facility] meets on Monday, Wednesdays and Friday mornings from 9am to 12pm.a group that meets in the evening hours. Therapist explained that she knew of an agency that did offer [facility] in the evenings and that is [facility].  Brian Nolan said that he knows [facility]. [facility] and will call him as he wants to engage in an evening group.  Remigio Eisenmenger, MS, LMFT, LCAS 07-06-23

## 2023-07-06 NOTE — Progress Notes (Signed)
Psychiatric Initial Adult Assessment   Patient Identification: Brian Nolan MRN:  [id number] Date of Evaluation:  07/06/2023 Referral Source: [facility] Chief Complaint:  "Things are okay" Visit Diagnosis:    ICD-10-CM   1. Substance induced mood disorder (HCC)  F19.94 QUEtiapine (SEROQUEL) 50 MG tablet    sertraline (ZOLOFT) 100 MG tablet    traZODone (DESYREL) 50 MG tablet    2. Tobacco use disorder  F17.200 nicotine (NICODERM CQ - DOSED IN MG/24 HOURS) 14 mg/24hr patch    3. GAD (generalized anxiety disorder)  F41.1 hydrOXYzine (ATARAX) 25 MG tablet    sertraline (ZOLOFT) 100 MG tablet    traZODone (DESYREL) 50 MG tablet    4. Well adult exam  Z00.00 pantoprazole (PROTONIX) 40 MG tablet    Ambulatory referral to Internal Medicine      History of Present Illness: 42 year old male seen today for initial psychiatric evaluation.  He was referred to outpatient psychiatry by [facility] where he presented on 06/01/2023-06/08/2023 for increasing depression and SI with a plan to jump off a bridge.  Per chart review he has a psychiatric history of depression, anxiety, Polysubstance Abuse (EtOH, marijuana, tobacco cocaine), and Prior Suicide Attempt (10/2021-held gun to head and pulled trigger), a remote history of Self Injurious Behavior (Cutting-last 2014), and Multiple Prior Psychiatric Hospitalizations (last- [facility] 03/2022).  He is currently managed on hydroxyzine 25 mg 3 times daily, Nicorette CQ 14 mg patches daily, Seroquel 50 mg nightly, Zoloft 100 mg daily, and trazodone 50 mg nightly as needed.  He informed Clinical research associate that his medications are effective in managing his psychiatric conditions.  Today he was well-groomed, pleasant, cooperative, and engaged in conversation.  He informed Clinical research associate that things are okay.  Patient notes that he had stressors in life but is learning to cope with them.  He notes that he worries about being in better place overall.  He notes that he wants financial  stability and his own place to stay.  He currently resides with his girlfriend but notes that at times he has conflict with her.  Despite these stressors patient notes that his anxiety and depression are well-managed.  Provider conducted a GAD-7 and patient scored a 7.  Provider also conducted PHQ-9 the patient scored a 3.  He endorses adequate sleep and increased appetite.  Today he denies SI/HI/VAH or paranoia.  At times patient notes that he is distracted, has fluctuations in mood and impulsively steals.  He denies other symptoms of mania.  Patient informed Clinical research associate that the death of his mother was traumatic a year ago.  He reports that she had stage IV lung cancer.  He denies flashbacks, nightmares, or avoidant behaviors.  Patient informed Clinical research associate that he drinks alcohol every few days.  Provider conducted an audit assessment and patient scored a 17.  He informed Clinical research associate that he has not utilize illegal substances since his discharge.  No medication changes today.  Patient agreeable to take medication as prescribed.  Patient asked Clinical research associate to fill Protonix as he is out of his prescription.  Provider was agreeable to this and referred him to primary care for further refills.  Patient referred to [facility] for therapy.  Associated Signs/Symptoms: Depression Symptoms:  depressed mood, fatigue, anxiety, weight gain, increased appetite, (Hypo) Manic Symptoms:  Distractibility, Elevated Mood, Impulsivity, Anxiety Symptoms:   Mild anxiety Psychotic Symptoms:   Denies  PTSD Symptoms: Had a traumatic exposure:  Mother passed away for stage 4 lung cancer  Past Psychiatric History: Schizophrenia, substance-induced  mood disorder, depression, anxiety, polysubstance use (tobacco, alcohol, cocaine, cannabis), and insomnia  Previous Psychotropic Medications:  hydroxyzine, nicotine patch, Seroquel, Zoloft   Substance Abuse History in the last 12 months:  Yes.    Consequences of Substance Abuse: Medical  Consequences:  Hospitalizations  Past Medical History:  Past Medical History:  Diagnosis Date   Depression    History of hiatal hernia    Schizophrenia (HCC)    Substance abuse (HCC)     Past Surgical History:  Procedure Laterality Date   NO PAST SURGERIES      Family Psychiatric History: Maternal uncle schizophrenia and bipolar, maternal cousin,   Family History:  Family History  Problem Relation Age of Onset   Schizophrenia Maternal Uncle    Diabetes Mellitus II Father     Social History:   Social History   Socioeconomic History   Marital status: Single    Spouse name: Not on file   Number of children: Not on file   Years of education: Not on file   Highest education level: Not on file  Occupational History   Not on file  Tobacco Use   Smoking status: Every Day    Current packs/day: 1.00    Average packs/day: 1 pack/day for 14.4 years (14.4 ttl pk-yrs)    Types: Cigarettes    Start date: 01/31/2008    Last attempt to quit: 01/30/2018    Passive exposure: Never   Smokeless tobacco: Never  Vaping Use   Vaping status: Former  Substance and Sexual Activity   Alcohol use: Yes    Alcohol/week: 6.0 - 24.0 standard drinks of alcohol    Types: 6 - 24 Cans of beer per week    Comment: 12 ppd   Drug use: Yes    Types: "Crack" cocaine, Marijuana, Cocaine   Sexual activity: Yes    Birth control/protection: Condom  Other Topics Concern   Not on file  Social History Narrative   Not on file   Social Determinants of Health   Financial Resource Strain: Not on file  Food Insecurity: No Food Insecurity (06/01/2023)   Hunger Vital Sign    Worried About Running Out of Food in the Last Year: Never true    Ran Out of Food in the Last Year: Never true  Transportation Needs: No Transportation Needs (06/01/2023)   PRAPARE - Administrator, Civil Service (Medical): No    Lack of Transportation (Non-Medical): No  Physical Activity: Not on file  Stress: Not on file   Social Connections: Not on file    Additional Social History: Patient resides in [place] with his girlfriend. 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 He has 2 children. He notes that he drinks alcohol socially. Patient notes that he has not used illegal substances since his hospitalization.   Allergies:  No Known Allergies  Metabolic Disorder Labs: Lab Results  Component Value Date   HGBA1C 5.7 (H) 05/31/2023   MPG 116.89 05/31/2023   MPG 99.67 11/16/2021   No results found for: "PROLACTIN" Lab Results  Component Value Date   CHOL 185 06/02/2023   TRIG 443 (H) 06/02/2023   HDL 41 06/02/2023   CHOLHDL 4.5 06/02/2023   VLDL UNABLE TO CALCULATE IF TRIGLYCERIDE OVER 400 mg/dL 08/65/7846   LDLCALC UNABLE TO CALCULATE IF TRIGLYCERIDE OVER 400 mg/dL 96/29/5284   LDLCALC 89 11/16/2021   Lab Results  Component Value Date   TSH 0.982 05/31/2023    Therapeutic Level Labs:  No results found for: "LITHIUM" No results found for: "CBMZ" No results found for: "VALPROATE"  Current Medications: Current Outpatient Medications  Medication Sig Dispense Refill   hydrOXYzine (ATARAX) 25 MG tablet Take 1 tablet (25 mg total) by mouth 3 (three) times daily as needed for anxiety. 90 tablet 3   nicotine (NICODERM CQ - DOSED IN MG/24 HOURS) 14 mg/24hr patch Place 1 patch (14 mg total) onto the skin daily as needed (nicotine dependence). 30 patch 3   pantoprazole (PROTONIX) 40 MG tablet Take 1 tablet (40 mg total) by mouth daily. 30 tablet 3   QUEtiapine (SEROQUEL) 50 MG tablet Take 1 tablet (50 mg total) by mouth at bedtime. 30 tablet 3   sertraline (ZOLOFT) 100 MG tablet Take 1 tablet (100 mg total) by mouth daily. 30 tablet 3   traZODone (DESYREL) 50 MG tablet Take 1 tablet (50 mg total) by mouth at bedtime as needed for sleep. 30 tablet 3   No current facility-administered medications for this visit.    Musculoskeletal: Strength & Muscle Tone: within normal limits and Telehealth  visit Gait & Station: normal, Telehealth visit Patient leans: N/A  Psychiatric Specialty Exam: Review of Systems  There were no vitals taken for this visit.There is no height or weight on file to calculate BMI.  General Appearance: Well Groomed  Eye Contact:  Good  Speech:  Clear and Coherent and Normal Rate  Volume:  Normal  Mood:  Euthymic  Affect:  Appropriate and Congruent  Thought Process:  Coherent, Goal Directed, and Linear  Orientation:  Full (Time, Place, and Person)  Thought Content:  WDL and Logical  Suicidal Thoughts:  No  Homicidal Thoughts:  No  Memory:  Immediate;   Good Recent;   Good Remote;   Good  Judgement:  Good  Insight:  Good  Psychomotor Activity:  Normal  Concentration:  Concentration: Good and Attention Span: Good  Recall:  Good  Fund of Knowledge:Good  Language: Good  Akathisia:  No  Handed:  Right  AIMS (if indicated):  not done  Assets:  Communication Skills Desire for Improvement Financial Resources/Insurance Housing Intimacy Leisure Time Physical Health Social Support Transportation Vocational/Educational  ADL's:  Intact  Cognition: WNL  Sleep:  Good   Screenings: AIMS    Flowsheet Row [facility] (Dis[facility]) [facility] (Discharged) from 04/24/2016 in [facility] Admission ([facility]) [facility]  AIMS Total Score 0 0 0 0 0      AUDIT    Flowsheet Row Video Visit from 07/06/2023 in [facility] Admission (Discharged) [facility] (Discharged) [facility] (Discharged) [facility]  [facility] (Discharged) from 04/24/2016 in [facility]INE  Alcohol Use Disorder Identification Test Final Score (AUDIT) 17 14 26  (P)  36 31      GAD-7    Flowsheet Row Video Visit from 07/06/2023 in [facility] Office Visit from 12/25/2021 in [facility]  Total GAD-7 Score 7 3      PHQ2-9    Flowsheet Row Video Visit from 07/06/2023 in [facility] ED from 05/31/2023 in [facility] Office Visit from 12/25/2021 in [facility]  PHQ-2 Total Score 1 6 0  PHQ-9 Total Score 3 16 --      Flowsheet Row Video Visit from 07/06/2023 in [facility] Admission (Discharged) [facility] from 05/31/2023 in [facility]  C-SSRS RISK CATEGORY Error: Question 6 not populated Low Risk Error: Question 1 not populated       Assessment and Plan: Patient notes that he is doing well on his current regimen.  No medication changes today.  Patient agreeable to take medication as prescribed.  Patient asked Clinical research associate to fill Protonix as he is out of his prescription.  Provider was agreeable to this and referred him to primary care for further refills.  Patient referred to [facility] for therapy. 1. Tobacco use disorder  Continue- nicotine (NICODERM CQ - DOSED IN MG/24 HOURS) 14 mg/24hr patch; Place 1 patch (14 mg total) onto the skin daily as needed (nicotine dependence).  Dispense: 30 patch; Refill: 3   2. Substance induced mood disorder (HCC)  Continue- QUEtiapine (SEROQUEL) 50 MG tablet; Take 1 tablet (50 mg total) by mouth at bedtime.  Dispense: 30 tablet; Refill: 3 Continue- sertraline (ZOLOFT) 100 MG tablet; Take 1 tablet (100 mg total) by mouth daily.  Dispense: 30 tablet; Refill:  3 Continue- traZODone (DESYREL) 50 MG tablet; Take 1 tablet (50 mg total) by mouth at bedtime as needed for sleep.  Dispense: 30 tablet; Refill: 3  3. GAD (generalized anxiety disorder)  Continue- hydrOXYzine (ATARAX) 25 MG tablet; Take 1 tablet (25 mg total) by mouth 3 (three) times daily as needed for anxiety.  Dispense: 90 tablet; Refill: 3 Continue- sertraline (ZOLOFT) 100 MG tablet; Take 1 tablet (100 mg total) by mouth daily.  Dispense: 30 tablet; Refill: 3 Continue- traZODone (DESYREL) 50 MG tablet; Take 1 tablet (50 mg total) by mouth at bedtime as needed for sleep.  Dispense: 30 tablet; Refill: 3  4. Well adult exam  Continue- pantoprazole (PROTONIX) 40 MG tablet; Take 1 tablet (40 mg total) by mouth daily.  Dispense: 30 tablet; Refill: 3 - Ambulatory referral to Internal Medicine   Collaboration of Care: Other provider involved in patient's care AEB PCP and Counselor   Patient/Guardian was advised Release of Information must be obtained prior to any record release in order to collaborate their care with an outside provider. Patient/Guardian was advised if they have not already done so to contact the registration department to sign all necessary forms in order for [place] to release information regarding their care.   Consent: Patient/Guardian gives verbal consent for treatment and assignment of benefits for services provided during this visit. Patient/Guardian expressed understanding and agreed to proceed.   Shanna Cisco, NP 11/4/202410:44 AM

## 2023-07-07 ENCOUNTER — Ambulatory Visit (INDEPENDENT_AMBULATORY_CARE_PROVIDER_SITE_OTHER): Payer: No Payment, Other | Admitting: Clinical

## 2023-07-07 DIAGNOSIS — F1994 Other psychoactive substance use, unspecified with psychoactive substance-induced mood disorder: Secondary | ICD-10-CM

## 2023-07-11 NOTE — Progress Notes (Signed)
Therapist confirmed with the client via telephone he has been referred to a in house dually licensed counselor. Client reported he is aware of his appt with Remigio Eisenmenger and will keep the future appt. Client reported no other needs at this time.

## 2023-07-15 ENCOUNTER — Other Ambulatory Visit: Payer: Self-pay

## 2023-07-23 ENCOUNTER — Encounter (HOSPITAL_COMMUNITY): Payer: Self-pay

## 2023-07-23 ENCOUNTER — Emergency Department (HOSPITAL_COMMUNITY)
Admission: EM | Admit: 2023-07-23 | Discharge: 2023-07-23 | Disposition: A | Discharge status: Other Acute Inpatient Hospital | Payer: No Typology Code available for payment source | Attending: Student | Admitting: Student

## 2023-07-23 ENCOUNTER — Other Ambulatory Visit: Payer: Self-pay

## 2023-07-23 ENCOUNTER — Encounter (HOSPITAL_COMMUNITY): Payer: Self-pay | Admitting: Psychiatry

## 2023-07-23 ENCOUNTER — Inpatient Hospital Stay (HOSPITAL_COMMUNITY)
Admission: AD | Admit: 2023-07-23 | Discharge: 2023-08-03 | DRG: 885 | Disposition: A | Discharge status: Home / Self Care | Payer: No Typology Code available for payment source | Source: Intra-hospital | Attending: Psychiatry | Admitting: Psychiatry

## 2023-07-23 DIAGNOSIS — Z833 Family history of diabetes mellitus: Secondary | ICD-10-CM

## 2023-07-23 DIAGNOSIS — F17200 Nicotine dependence, unspecified, uncomplicated: Secondary | ICD-10-CM | POA: Insufficient documentation

## 2023-07-23 DIAGNOSIS — Z811 Family history of alcohol abuse and dependence: Secondary | ICD-10-CM | POA: Diagnosis not present

## 2023-07-23 DIAGNOSIS — F209 Schizophrenia, unspecified: Secondary | ICD-10-CM | POA: Diagnosis present

## 2023-07-23 DIAGNOSIS — F17210 Nicotine dependence, cigarettes, uncomplicated: Secondary | ICD-10-CM | POA: Diagnosis present

## 2023-07-23 DIAGNOSIS — Z813 Family history of other psychoactive substance abuse and dependence: Secondary | ICD-10-CM

## 2023-07-23 DIAGNOSIS — F332 Major depressive disorder, recurrent severe without psychotic features: Secondary | ICD-10-CM | POA: Diagnosis present

## 2023-07-23 DIAGNOSIS — F4320 Adjustment disorder, unspecified: Secondary | ICD-10-CM | POA: Diagnosis present

## 2023-07-23 DIAGNOSIS — Z9151 Personal history of suicidal behavior: Secondary | ICD-10-CM | POA: Diagnosis not present

## 2023-07-23 DIAGNOSIS — Z9152 Personal history of nonsuicidal self-harm: Secondary | ICD-10-CM

## 2023-07-23 DIAGNOSIS — R45851 Suicidal ideations: Secondary | ICD-10-CM | POA: Diagnosis present

## 2023-07-23 DIAGNOSIS — Z818 Family history of other mental and behavioral disorders: Secondary | ICD-10-CM | POA: Diagnosis not present

## 2023-07-23 DIAGNOSIS — F1910 Other psychoactive substance abuse, uncomplicated: Secondary | ICD-10-CM | POA: Insufficient documentation

## 2023-07-23 DIAGNOSIS — G4700 Insomnia, unspecified: Secondary | ICD-10-CM | POA: Diagnosis present

## 2023-07-23 DIAGNOSIS — F411 Generalized anxiety disorder: Secondary | ICD-10-CM | POA: Diagnosis present

## 2023-07-23 LAB — CBC
HCT: 46.4 % (ref 39.0–52.0)
Hemoglobin: 15.9 g/dL (ref 13.0–17.0)
MCH: 30.5 pg (ref 26.0–34.0)
MCHC: 34.3 g/dL (ref 30.0–36.0)
MCV: 89.1 fL (ref 80.0–100.0)
Platelets: 183 10*3/uL (ref 150–400)
RBC: 5.21 MIL/uL (ref 4.22–5.81)
RDW: 13.8 % (ref 11.5–15.5)
WBC: 9.5 10*3/uL (ref 4.0–10.5)
nRBC: 0 % (ref 0.0–0.2)

## 2023-07-23 LAB — COMPREHENSIVE METABOLIC PANEL
ALT: 21 U/L (ref 0–44)
AST: 20 U/L (ref 15–41)
Albumin: 3.5 g/dL (ref 3.5–5.0)
Alkaline Phosphatase: 83 U/L (ref 38–126)
Anion gap: 12 (ref 5–15)
BUN: 13 mg/dL (ref 6–20)
CO2: 22 mmol/L (ref 22–32)
Calcium: 8.8 mg/dL — ABNORMAL LOW (ref 8.9–10.3)
Chloride: 105 mmol/L (ref 98–111)
Creatinine, Ser: 0.82 mg/dL (ref 0.61–1.24)
GFR, Estimated: >60 mL/min (ref >60)
Glucose, Bld: 104 mg/dL — ABNORMAL HIGH (ref 70–99)
Potassium: 3.5 mmol/L (ref 3.5–5.1)
Sodium: 139 mmol/L (ref 135–145)
Total Bilirubin: 0.6 mg/dL (ref <1.2)
Total Protein: 6.7 g/dL (ref 6.5–8.1)

## 2023-07-23 LAB — ACETAMINOPHEN LEVEL: Acetaminophen (Tylenol), Serum: <10 ug/mL — ABNORMAL LOW (ref 10–30)

## 2023-07-23 LAB — RAPID URINE DRUG SCREEN, HOSP PERFORMED
Amphetamines: NOT DETECTED
Barbiturates: NOT DETECTED
Benzodiazepines: NOT DETECTED
Cocaine: POSITIVE — AB
Opiates: NOT DETECTED
Tetrahydrocannabinol: POSITIVE — AB

## 2023-07-23 LAB — SALICYLATE LEVEL: Salicylate Lvl: <7 mg/dL — ABNORMAL LOW (ref 7.0–30.0)

## 2023-07-23 LAB — ETHANOL: Alcohol, Ethyl (B): <10 mg/dL (ref <10)

## 2023-07-23 MED ORDER — ACETAMINOPHEN 325 MG PO TABS
650.0000 mg | ORAL_TABLET | Freq: Four times a day (QID) | ORAL | Status: DC | PRN
  Administered 2023-08-01 (×2): 650 mg via ORAL
  Filled 2023-07-23 (×3): qty 2

## 2023-07-23 MED ORDER — TRAZODONE HCL 50 MG PO TABS
50.0000 mg | ORAL_TABLET | Freq: Every evening | ORAL | Status: DC | PRN
  Administered 2023-07-23 – 2023-08-02 (×10): 50 mg via ORAL
  Filled 2023-07-23 (×3): qty 1
  Filled 2023-07-23: qty 14
  Filled 2023-07-23 (×9): qty 1

## 2023-07-23 MED ORDER — OLANZAPINE 10 MG IM SOLR: 5.0000 mg | Freq: Three times a day (TID) | INTRAMUSCULAR | Status: DC | PRN

## 2023-07-23 MED ORDER — MAGNESIUM HYDROXIDE 400 MG/5ML PO SUSP
30.0000 mL | Freq: Every day | ORAL | Status: DC | PRN
Start: 2023-07-23 — End: 2023-08-03

## 2023-07-23 MED ORDER — HYDROXYZINE HCL 25 MG PO TABS
25.0000 mg | ORAL_TABLET | Freq: Three times a day (TID) | ORAL | Status: DC | PRN
  Administered 2023-07-23 – 2023-08-02 (×12): 25 mg via ORAL
  Filled 2023-07-23: qty 20
  Filled 2023-07-23 (×13): qty 1

## 2023-07-23 MED ORDER — ALUM & MAG HYDROXIDE-SIMETH 200-200-20 MG/5ML PO SUSP
30.0000 mL | ORAL | Status: DC | PRN
  Administered 2023-07-26 – 2023-07-27 (×3): 30 mL via ORAL
  Filled 2023-07-23 (×4): qty 30
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
## 2023-07-23 NOTE — ED Provider Notes (Signed)
[place] EMERGENCY DEPARTMENT AT [facility] Provider Note  CSN: [id number] Arrival date & time: 07/23/23 3664  Chief Complaint(s) Suicidal  HPI Brian Nolan is a 42 y.o. male with PMH polysubstance abuse, schizophrenia, depression who presents emergency room for evaluation of suicidal ideation.  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  He states that he then used drugs in anger and has a plan to kill himself by taking a whole bottle of trazodone.  Currently denies chest pain, shortness of breath or any medical complaints.  Denies homicidal ideation, auditory or visual hallucinations.   Past Medical History Past Medical History:  Diagnosis Date   Depression    History of hiatal hernia    Schizophrenia (HCC)    Substance abuse (HCC)    Patient Active Problem List   Diagnosis Date Noted   GAD (generalized anxiety disorder) 06/02/2023   MDD (major depressive disorder), recurrent severe, without psychosis (HCC) 06/01/2023   Polysubstance abuse (HCC) 06/01/2023   Sleep disturbances 12/25/2021   Substance induced mood disorder (HCC)    History of cocaine dependence (HCC) 05/26/2018   History of alcohol abuse 05/26/2018   Depression 05/26/2018   Insomnia 05/26/2018   Schizophrenia (HCC) 05/26/2018   Multifocal pneumonia 05/25/2018   Major depressive disorder, recurrent episode, mild (HCC) 02/01/2017   Tobacco use disorder 04/25/2016   Hyperlipidemia 10/12/2015   Cannabis use disorder, mild, abuse 10/11/2015   Major depressive disorder, recurrent episode, severe (HCC) 10/10/2015   Suicidal ideation    Home Medication(s) Prior to Admission medications   Medication Sig Start Date End Date Taking? Authorizing Provider  hydrOXYzine (ATARAX) 25 MG tablet Take 1 tablet (25 mg  total) by mouth 3 (three) times daily as needed for anxiety. 07/06/23   Shanna Cisco, NP  nicotine (NICODERM CQ - DOSED IN MG/24 HOURS) 14 mg/24hr patch Place 1 patch (14 mg total) onto the skin daily as needed (nicotine dependence). 07/06/23   Shanna Cisco, NP  pantoprazole (PROTONIX) 40 MG tablet Take 1 tablet (40 mg total) by mouth daily. 07/06/23   Shanna Cisco, NP  QUEtiapine (SEROQUEL) 50 MG tablet Take 1 tablet (50 mg total) by mouth at bedtime. 07/06/23   Shanna Cisco, NP  sertraline (ZOLOFT) 100 MG tablet Take 1 tablet (100 mg total) by mouth daily. 07/06/23   Shanna Cisco, NP  traZODone (DESYREL) 50 MG tablet Take 1 tablet (50 mg total) by mouth at bedtime as needed for sleep. 07/06/23   Shanna Cisco, NP  Past Surgical History Past Surgical History:  Procedure Laterality Date   NO PAST SURGERIES     Family History Family History  Problem Relation Age of Onset   Schizophrenia Maternal Uncle    Diabetes Mellitus II Father     Social History Social History   Tobacco Use   Smoking status: Every Day    Current packs/day: 1.00    Average packs/day: 1 pack/day for 14.5 years (14.5 ttl pk-yrs)    Types: Cigarettes    Start date: 01/31/2008    Last attempt to quit: 01/30/2018    Passive exposure: Never   Smokeless tobacco: Never  Vaping Use   Vaping status: Former  Substance Use Topics   Alcohol use: Yes    Alcohol/week: 6.0 - 24.0 standard drinks of alcohol    Types: 6 - 24 Cans of beer per week    Comment: 12 ppd   Drug use: Yes    Types: "Crack" cocaine, Marijuana, Cocaine   Allergies Patient has no known allergies.  Review of Systems Review of Systems  Psychiatric/Behavioral:  Positive for suicidal ideas.     Physical Exam Vital Signs  I have reviewed the triage vital signs BP (!) 141/92 (BP Location:  Left Arm)   Pulse 95   Temp 98 F (36.7 C) (Oral)   Resp 18   Ht 5\' 9"  (1.753 m)   Wt 108.4 kg   SpO2 98%   BMI 35.29 kg/m   Physical Exam Constitutional:      General: He is not in acute distress.    Appearance: Normal appearance.  HENT:     Head: Normocephalic and atraumatic.     Nose: No congestion or rhinorrhea.  Eyes:     General:        Right eye: No discharge.        Left eye: No discharge.     Extraocular Movements: Extraocular movements intact.     Pupils: Pupils are equal, round, and reactive to light.  Cardiovascular:     Rate and Rhythm: Normal rate and regular rhythm.     Heart sounds: No murmur heard. Pulmonary:     Effort: No respiratory distress.     Breath sounds: No wheezing or rales.  Abdominal:     General: There is no distension.     Tenderness: There is no abdominal tenderness.  Musculoskeletal:        General: Normal range of motion.     Cervical back: Normal range of motion.  Skin:    General: Skin is warm and dry.  Neurological:     General: No focal deficit present.     Mental Status: He is alert.     ED Results and Treatments Labs (all labs ordered are listed, but only abnormal results are displayed) Labs Reviewed  COMPREHENSIVE METABOLIC PANEL - Abnormal; Notable for the following components:      Result Value   Glucose, Bld 104 (*)    Calcium 8.8 (*)    All other components within normal limits  SALICYLATE LEVEL - Abnormal; Notable for the following components:   Salicylate Lvl <7.0 (*)    All other components within normal limits  ACETAMINOPHEN LEVEL - Abnormal; Notable for the following components:   Acetaminophen (Tylenol), Serum <10 (*)    All other components within normal limits  RAPID URINE DRUG SCREEN, HOSP PERFORMED - Abnormal; Notable for the following components:   Cocaine POSITIVE (*)    Tetrahydrocannabinol POSITIVE (*)  All other components within normal limits  ETHANOL  CBC                                                                                                                           Radiology No results found.  Pertinent labs & imaging results that were available during my care of the patient were reviewed by me and considered in my medical decision making (see MDM for details).  Medications Ordered in ED Medications - No data to display                                                                                                                                   Procedures Procedures  (including critical care time)  Medical Decision Making / ED Course   This patient presents to the ED for concern of SI, this involves an extensive number of treatment options, and is a complaint that carries with it a high risk of complications and morbidity.  The differential diagnosis includes depression, psychosis, grief, electrolyte abnormality, metabolic encephalopathy, polysubstance use, polysubstance withdrawal, ingestion  MDM: Patient seen in the emergency room for evaluation of suicidal ideation.  Physical exam is unremarkable.  Laboratory evaluation unremarkable.  UDS positive for cocaine and THC.  TTS consult placed and at this time patient is medically cleared.  Disposition pending TTS evaluation.  Please see provider signout for continuation of workup   Additional history obtained:  -External records from outside source obtained and reviewed including: Chart review including previous notes, labs, imaging, consultation notes   Lab Tests: -I ordered, reviewed, and interpreted labs.   The pertinent results include:   Labs Reviewed  COMPREHENSIVE METABOLIC PANEL - Abnormal; Notable for the following components:      Result Value   Glucose, Bld 104 (*)    Calcium 8.8 (*)    All other components within normal limits  SALICYLATE LEVEL - Abnormal; Notable for the following components:   Salicylate Lvl <7.0 (*)    All other components within normal limits  ACETAMINOPHEN LEVEL -  Abnormal; Notable for the following components:   Acetaminophen (Tylenol), Serum <10 (*)    All other components within normal limits  RAPID URINE DRUG SCREEN, HOSP PERFORMED - Abnormal; Notable for the following components:   Cocaine POSITIVE (*)    Tetrahydrocannabinol POSITIVE (*)    All other components within normal limits  ETHANOL  CBC      Medicines ordered and prescription drug management: No orders of the defined types were placed in this encounter.   -I have reviewed the patients home medicines and have made adjustments as needed  Critical interventions none  Consultations Obtained: I requested consultation with the TTS,  and discussed lab and imaging findings as well as pertinent plan - they recommend: Recommendations pending   Social Determinants of Health:  Factors impacting patients care include: Polysubstance use, recently lost his job   Reevaluation: After the interventions noted above, I reevaluated the patient and found that they have :stayed the same  Co morbidities that complicate the patient evaluation  Past Medical History:  Diagnosis Date   Depression    History of hiatal hernia    Schizophrenia (HCC)    Substance abuse (HCC)       Dispostion: I considered admission for this patient, and disposition pending TTS evaluation.     Final Clinical Impression(s) / ED Diagnoses Final diagnoses:  None     @PCDICTATION @    Glendora Score, MD 07/23/23 [phone]

## 2023-07-23 NOTE — Consult Note (Addendum)
BH ED ASSESSMENT   Reason for Consult: SI Referring Physician:  Dr. Audrie Lia  Patient Identification: Brian Nolan MRN:  [id number] ED Chief Complaint: MDD (major depressive disorder), recurrent severe, without psychosis (HCC)  Diagnosis:  Principal Problem:   MDD (major depressive disorder), recurrent severe, without psychosis (HCC) Active Problems:   Polysubstance abuse [facility])   ED Assessment Time Calculation: Start Time: 1100 Stop Time: 1140 Total Time in Minutes (Assessment Completion): 40   Subjective: Brian Nolan is a 42 y.o. male patient admitted with PMH polysubstance abuse, schizophrenia, depression who presents emergency room for evaluation of suicidal ideation.   HPI:  Brian Nolan, 42 y.o., male patient seen face to face by this provider, consulted with Dr. Lucianne Muss; and chart reviewed on 07/23/23.  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 He states that he has a lot of thoughts going through his head, these thoughts are telling him to kill himself, and sometimes he does not mind if he goes to bed and not wakes up.  Patient states that he called over to behavioral health Hospital several times, to get in contact with psychiatric provider Dr. Sherron Flemings, he states that he was given the run around several times and then eventually was told after about a month, that the hospital was unable to provide him with a clearance to return to work.   Patient states after that he returned to what he knows best, which is using cocaine. Patient states he has been using cocaine for the past couple days. He denies cocaine as being an issue, states he does not use it everyday, but turns back to it in times of distress.    During evaluation Brian Nolan is laying in his bed, and speaking with provider and patient appears to be in moderate distress, about his medical clearance and being terminated from his job. He is alert, oriented x 4, at times anxious, cooperative and attentive. His mood is anxious with congruent affect. He has normal speech, and appropriate behavior.  Objectively there is no evidence of psychosis/mania or delusional thinking. Patient is able to converse coherently, goal directed thoughts, no distractibility, or pre-occupation. He denies self-harm/homicidal ideation, psychosis, and paranoia.  Patient endorses thoughts of SI by taking a bottle of trazodone, he also reports poor energy, low motivation and hypersomnia and decreased eating.  He admitted using thc and Cocaine to cope with the stress. Patient is a 42 y.o. male with PMH polysubstance abuse, schizophrenia, depression who presented for evaluation of suicidal ideation.  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  He states that he then used drugs in anger and has a plan to kill himself by taking a whole bottle of trazodone. Patient continues to ruminate and  obsess on the thoughts of being terminated from his job. Patient states that he is currently living with his with his ex girlfriend, states they are trying to work on their relationship, he says she has not been emotionally abusive to him. Patient states that he has a mental health diagnosis of MDD and is not compliant with medications.   Patient was [facility] to [facility] in September 2024, diagnosed with MDD, schizophrenia, polysubstance abuse, states that he was supposed to take Zoloft, Seroquel, and trazodone but states he has not been compliant with medications, because he feels that these medications do not work for him, do not make him feel better.   Past Psychiatric History: MDD, anxiety, polysubstance abuse   Risk to Self or Others: Risk to Self: Yes Risk to Others:  No  Prior Inpatient Therapy:  Yes  Prior Outpatient Therapy:  No   [place] Scale:  Flowsheet Row ED from 07/23/2023 in [facility] Emergency Department at [facility] Video Visit from 07/06/2023 in [facility] Ad[facility]Dis[facility]  C-SSRS RISK CATEGORY Moderate Risk Error: Question 6 not populated Low Risk       AIMS:  , , ,  ,   ASAM:    Substance Abuse:     Past Medical History:  Past Medical History:  Diagnosis Date   Depression    History of hiatal hernia    Schizophrenia (HCC)    Substance abuse (HCC)     Past Surgical History:  Procedure Laterality Date   NO PAST SURGERIES     Family History:  Family History  Problem Relation Age of Onset   Schizophrenia Maternal Uncle    Diabetes Mellitus II Father     Social History:  Social History   Substance and Sexual Activity  Alcohol Use Yes   Alcohol/week: 6.0 - 24.0 standard drinks of alcohol   Types: 6 - 24 Cans of beer per week   Comment: 12 ppd     Social History   Substance and Sexual Activity  Drug Use Yes   Types: "Crack" cocaine, Marijuana, Cocaine    Social History   Socioeconomic History   Marital status: Single    Spouse name: Not on file   Number of children: Not on file   Years of education: Not on file   Highest education level: Not on file  Occupational History   Not on file  Tobacco Use   Smoking status: Every Day    Current  packs/day: 1.00    Average packs/day: 1 pack/day for 14.5 years (14.5 ttl pk-yrs)    Types: Cigarettes    Start date: 01/31/2008    Last attempt to quit: 01/30/2018    Passive exposure: Never   Smokeless tobacco: Never  Vaping Use   Vaping status: Former  Substance and Sexual Activity   Alcohol use: Yes    Alcohol/week: 6.0 - 24.0 standard drinks of alcohol    Types: 6 - 24 Cans of beer per week    Comment: 12 ppd   Drug use: Yes    Types: "Crack" cocaine, Marijuana, Cocaine   Sexual activity: Yes    Birth control/protection: Condom  Other Topics Concern   Not on file  Social History Narrative   Not on file   Social Determinants of Health   Financial Resource Strain: Not on file  Food Insecurity: No Food Insecurity (06/01/2023)   Hunger Vital Sign  Worried About Programme researcher, broadcasting/film/video in the Last Year: Never true    Ran Out of Food in the Last Year: Never true  Transportation Needs: No Transportation Needs (06/01/2023)   PRAPARE - Administrator, Civil Service (Medical): No    Lack of Transportation (Non-Medical): No  Physical Activity: Not on file  Stress: Not on file  Social Connections: Not on file      Allergies:  No Known Allergies  Labs:  Results for orders placed or performed during the hospital encounter of 07/23/23 (from the past 48 hour(s))  Comprehensive metabolic panel     Status: Abnormal   Collection Time: 07/23/23  8:56 AM  Result Value Ref Range   Sodium 139 135 - 145 mmol/L   Potassium 3.5 3.5 - 5.1 mmol/L   Chloride 105 98 - 111 mmol/L   CO2 22 22 - 32 mmol/L   Glucose, Bld 104 (H) 70 - 99 mg/dL    Comment: Glucose reference range applies only to samples taken after fasting for at least 8 hours.   BUN 13 6 - 20 mg/dL   Creatinine, Ser 1.19 0.61 - 1.24 mg/dL   Calcium 8.8 (L) 8.9 - 10.3 mg/dL   Total Protein 6.7 6.5 - 8.1 g/dL   Albumin 3.5 3.5 - 5.0 g/dL   AST 20 15 - 41 U/L   ALT 21 0 - 44 U/L   Alkaline Phosphatase 83 38 - 126 U/L    Total Bilirubin 0.6 <1.2 mg/dL   GFR, Estimated >14 >78 mL/min    Comment: (NOTE) Calculated using the CKD-EPI Creatinine Equation (2021)    Anion gap 12 5 - 15    Comment: Performed at [facility], [address], [place], [place] [address]  Ethanol     Status: None   Collection Time: 07/23/23  8:56 AM  Result Value Ref Range   Alcohol, Ethyl (B) <10 <10 mg/dL    Comment: (NOTE) Lowest detectable limit for serum alcohol is 10 mg/dL.  For medical purposes only. Performed at [facility], [address], [place], [place] [address]   Salicylate level     Status: Abnormal   Collection Time: 07/23/23  8:56 AM  Result Value Ref Range   Salicylate Lvl <7.0 (L) 7.0 - 30.0 mg/dL    Comment: Performed at [facility], [address] [place], [place] [address]  Acetaminophen level     Status: Abnormal   Collection Time: 07/23/23  8:56 AM  Result Value Ref Range   Acetaminophen (Tylenol), Serum <10 (L) 10 - 30 ug/mL    Comment: (NOTE) Therapeutic concentrations vary significantly. A range of 10-30 ug/mL  may be an effective concentration for many patients. However, some  are best treated at concentrations outside of this range. Acetaminophen concentrations >150 ug/mL at 4 hours after ingestion  and >50 ug/mL at 12 hours after ingestion are often associated with  toxic reactions.  Performed at [facility], [address], [place], [place] [address]   cbc     Status: None   Collection Time: 07/23/23  8:56 AM  Result Value Ref Range   WBC 9.5 4.0 - 10.5 K/uL   RBC 5.21 4.22 - 5.81 MIL/uL   Hemoglobin 15.9 13.0 - 17.0 g/dL   HCT 28.4 13.2 - 44.0 %   MCV 89.1 80.0 - 100.0 fL   MCH 30.5 26.0 - 34.0 pg   MCHC 34.3 30.0 - 36.0  g/dL   RDW 09.8 11.9 - 14.7 %   Platelets 183 150 - 400 K/uL   nRBC 0.0 0.0 - 0.2 %    Comment: Performed at [facility], [address], [place], [place]  [address]  Rapid urine drug screen (hospital performed)     Status: Abnormal   Collection Time: 07/23/23  8:56 AM  Result Value Ref Range   Opiates NONE DETECTED NONE DETECTED   Cocaine POSITIVE (A) NONE DETECTED   Benzodiazepines NONE DETECTED NONE DETECTED   Amphetamines NONE DETECTED NONE DETECTED   Tetrahydrocannabinol POSITIVE (A) NONE DETECTED   Barbiturates NONE DETECTED NONE DETECTED    Comment: (NOTE) DRUG SCREEN FOR MEDICAL PURPOSES ONLY.  IF CONFIRMATION IS NEEDED FOR ANY PURPOSE, NOTIFY LAB WITHIN 5 DAYS.  LOWEST DETECTABLE LIMITS FOR URINE DRUG SCREEN Drug Class                     Cutoff (ng/mL) Amphetamine and metabolites    1000 Barbiturate and metabolites    200 Benzodiazepine                 200 Opiates and metabolites        300 Cocaine and metabolites        300 THC                            50 Performed at [facility], [address], [place], [place] [address]     No current facility-administered medications for this encounter.   Current Outpatient Medications  Medication Sig Dispense Refill   hydrOXYzine (ATARAX) 25 MG tablet Take 1 tablet (25 mg total) by mouth 3 (three) times daily as needed for anxiety. 90 tablet 3   nicotine (NICODERM CQ - DOSED IN MG/24 HOURS) 14 mg/24hr patch Place 1 patch (14 mg total) onto the skin daily as needed (nicotine dependence). 30 patch 3   pantoprazole (PROTONIX) 40 MG tablet Take 1 tablet (40 mg total) by mouth daily. 30 tablet 3   QUEtiapine (SEROQUEL) 50 MG tablet Take 1 tablet (50 mg total) by mouth at bedtime. 30 tablet 3   sertraline (ZOLOFT) 100 MG tablet Take 1 tablet (100 mg total) by mouth daily. 30 tablet 3   traZODone (DESYREL) 50 MG tablet Take 1 tablet (50 mg total) by mouth at bedtime as needed for sleep. 30 tablet 3    Musculoskeletal: Strength & Muscle Tone: within normal limits Gait & Station: normal Patient leans: N/A   Psychiatric Specialty Exam: Presentation  General  Appearance:  Appropriate for Environment  Eye Contact: Fleeting  Speech: Clear and Coherent  Speech Volume: Increased  Handedness: Right   Mood and Affect  Mood: Irritable  Affect: Appropriate   Thought Process  Thought Processes: Coherent  Descriptions of Associations:Intact  Orientation:Full (Time, Place and Person)  Thought Content:Logical; Obsessions; Rumination  History of Schizophrenia/Schizoaffective disorder:No  Duration of Psychotic Symptoms:N/A  Hallucinations:Hallucinations: None  Ideas of Reference:None  Suicidal Thoughts:Suicidal Thoughts: Yes, Passive  Homicidal Thoughts:Homicidal Thoughts: No   Sensorium  Memory: Immediate Good; Recent Fair; Remote Fair  Judgment: Fair  Insight: Fair   Art therapist  Concentration: Fair  Attention Span: Fair  Recall: [employer] of Knowledge: Fair  Language: Fair   Psychomotor Activity  Psychomotor Activity: Psychomotor Activity: Normal   Assets  Assets: Communication Skills; Desire for Improvement; Social Support; Housing    Sleep  Sleep:  Sleep: Poor   Physical Exam: Physical Exam Vitals and nursing note reviewed. Exam conducted with a chaperone present.  Neurological:     Mental Status: He is alert.  Psychiatric:        Attention and Perception: Attention normal.        Mood and Affect: Mood is anxious.        Speech: Speech normal.        Behavior: Behavior is agitated. Behavior is cooperative.        Thought Content: Thought content includes suicidal ideation. Thought content includes suicidal plan.        Cognition and Memory: Memory normal.        Judgment: Judgment is inappropriate.    Review of Systems  Constitutional: Negative.   Psychiatric/Behavioral:  Positive for depression, substance abuse and suicidal ideas.    Blood pressure (!) 141/92, pulse 95, temperature 98 F (36.7 C), temperature source Oral, resp. rate 18, height 5\' 9"  (1.753 m),  weight 108.4 kg, SpO2 98%. Body mass index is 35.29 kg/m.    Medical Decision Making: Patient case review and discussed with Dr. Lucianne Muss. Patient needs inpatient psychiatric admission for stabilization and treatment. Patient has been accepted to [facility] on 07/23/2023 Bed assignment: 305-2    Disposition: Recommend psychiatric Inpatient admission.   Alona Bene, PMHNP 07/23/2023 12:13 PM

## 2023-07-23 NOTE — ED Notes (Signed)
Report called  

## 2023-07-23 NOTE — ED Notes (Signed)
[facility] called 

## 2023-07-23 NOTE — [facility] Group Notes (Signed)
[facility] Group Notes:  (Nursing/MHT/Case Management/Adjunct)  Date:  07/23/2023  Time:  9:54 PM  Type of Therapy:   Wrap-up group  Participation Level:  Active  Participation Quality:  Appropriate  Affect:  Appropriate  Cognitive:  Appropriate  Insight:  Appropriate  Engagement in Group:  Engaged  Modes of Intervention:  Education  Summary of Progress/Problems: Goal to get back to himself. Rated day 1/10.  Noah Delaine 07/23/2023, 9:54 PM

## 2023-07-23 NOTE — ED Triage Notes (Signed)
Patient reports SI without plan for the last month. States he was seen in [place] for same and started on medication. Patient also has hx of drug use. Last used cocaine "a couple days ago."

## 2023-07-23 NOTE — Progress Notes (Signed)
Pt has been accepted to [facility] on 07/23/2023 Bed assignment: 305-2   Pt meets inpatient criteria per: Alona Bene, PMHNP   Attending Physician will be: Phineas Inches, MD   Report can be called to: Adult unit: [phone]  Pt can arrive after EKG, Consent is received.   Care Team Notified: [facility]  Malva Limes RN , Jacquelynn Cree NT, [place], PMHNP   [place] Chrisoula Zegarra LCSW-A   07/23/2023 11:50 AM

## 2023-07-23 NOTE — Tx Team (Signed)
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    Medication noncompliance "I had to stop taking these medications if I can't function to work".    Financial concerns "I can't work with all these medications".    Alterations in mood (Anxiety, depression & anger) "I can't work, y'all doctor did not give me any permission to work while [employer] on theses medicines".         DISCHARGE CRITERIA:  Improved stabilization in mood, thinking, and/or behavior Verbal commitment to aftercare and medication compliance Withdrawal symptoms are absent or subacute and managed without 24-hour nursing intervention  PRELIMINARY DISCHARGE PLAN: Outpatient therapy Return to previous living arrangement  PATIENT/FAMILY INVOLVEMENT: This treatment plan has been presented to and reviewed with the patient, Brian Nolan. The patient have been given the opportunity to ask questions and make suggestions.  Sherryl Manges, RN 07/23/2023, 5:45 PM

## 2023-07-24 ENCOUNTER — Encounter (HOSPITAL_COMMUNITY): Payer: Self-pay

## 2023-07-24 DIAGNOSIS — F332 Major depressive disorder, recurrent severe without psychotic features: Secondary | ICD-10-CM | POA: Diagnosis not present

## 2023-07-24 MED ORDER — SERTRALINE HCL 50 MG PO TABS
50.0000 mg | ORAL_TABLET | Freq: Every day | ORAL | Status: DC
  Administered 2023-07-24 – 2023-08-03 (×11): 50 mg via ORAL
  Filled 2023-07-24 (×12): qty 1

## 2023-07-24 NOTE — Plan of Care (Signed)
Problem: Education: Goal: Knowledge of [place] [facility] Education information/materials will improve Outcome: Progressing Goal: Emotional status will improve Outcome: Progressing Goal: Mental status will improve Outcome: Progressing Goal: Verbalization of understanding the information provided will improve Outcome: Progressing   Problem: Activity: Goal: Interest or engagement in activities will improve Outcome: Progressing Goal: Sleeping patterns will improve Outcome: Progressing   Problem: Coping: Goal: Ability to verbalize frustrations and anger appropriately will improve Outcome: Progressing Goal: Ability to demonstrate self-control will improve Outcome: Progressing   Problem: Health Behavior/Discharge Planning: Goal: Identification of resources available to assist in meeting health care needs will improve Outcome: Progressing Goal: Compliance with treatment plan for underlying cause of condition will improve Outcome: Progressing   Problem: Physical Regulation: Goal: Ability to maintain clinical measurements within normal limits will improve Outcome: Progressing   Problem: Safety: Goal: Periods of time without injury will increase Outcome: Progressing   Problem: Education: Goal: Knowledge of [place] [facility] Education information/materials will improve Outcome: Progressing Goal: Emotional status will improve Outcome: Progressing Goal: Mental status will improve Outcome: Progressing Goal: Verbalization of understanding the information provided will improve Outcome: Progressing   Problem: Activity: Goal: Interest or engagement in activities will improve Outcome: Progressing Goal: Sleeping patterns will improve Outcome: Progressing   Problem: Coping: Goal: Ability to verbalize frustrations and anger appropriately will improve Outcome: Progressing Goal: Ability to demonstrate self-control will improve Outcome: Progressing   Problem: Health  Behavior/Discharge Planning: Goal: Identification of resources available to assist in meeting health care needs will improve Outcome: Progressing Goal: Compliance with treatment plan for underlying cause of condition will improve Outcome: Progressing   Problem: Physical Regulation: Goal: Ability to maintain clinical measurements within normal limits will improve Outcome: Progressing   Problem: Safety: Goal: Periods of time without injury will increase Outcome: Progressing   Problem: Education: Goal: Knowledge of disease or condition will improve Outcome: Progressing Goal: Understanding of discharge needs will improve Outcome: Progressing   Problem: Health Behavior/Discharge Planning: Goal: Ability to identify changes in lifestyle to reduce recurrence of condition will improve Outcome: Progressing Goal: Identification of resources available to assist in meeting health care needs will improve Outcome: Progressing   Problem: Physical Regulation: Goal: Complications related to the disease process, condition or treatment will be avoided or minimized Outcome: Progressing   Problem: Safety: Goal: Ability to remain free from injury will improve Outcome: Progressing   Problem: Education: Goal: Ability to make informed decisions regarding treatment will improve Outcome: Progressing   Problem: Coping: Goal: Coping ability will improve Outcome: Progressing   Problem: Health Behavior/Discharge Planning: Goal: Identification of resources available to assist in meeting health care needs will improve Outcome: Progressing   Problem: Medication: Goal: Compliance with prescribed medication regimen will improve Outcome: Progressing   Problem: Self-Concept: Goal: Ability to disclose and discuss suicidal ideas will improve Outcome: Progressing Goal: Will verbalize positive feelings about self Outcome: Progressing Note: Patient is not on track and improving. Patient will work on  increased adherence    Problem: Education: Goal: Utilization of techniques to improve thought processes will improve Outcome: Progressing Goal: Knowledge of the prescribed therapeutic regimen will improve Outcome: Progressing   Problem: Activity: Goal: Interest or engagement in leisure activities will improve Outcome: Progressing Goal: Imbalance in normal sleep/wake cycle will improve Outcome: Progressing   Problem: Coping: Goal: Coping ability will improve Outcome: Progressing Goal: Will verbalize feelings Outcome: Progressing   Problem: Health Behavior/Discharge Planning: Goal: Ability to make decisions will improve Outcome: Progressing Goal: Compliance with therapeutic regimen  will improve Outcome: Progressing   Problem: Role Relationship: Goal: Will demonstrate positive changes in social behaviors and relationships Outcome: Progressing   Problem: Safety: Goal: Ability to disclose and discuss suicidal ideas will improve Outcome: Progressing Goal: Ability to identify and utilize support systems that promote safety will improve Outcome: Progressing   Problem: Self-Concept: Goal: Will verbalize positive feelings about self Outcome: Progressing Goal: Level of anxiety will decrease Outcome: Progressing

## 2023-07-24 NOTE — Progress Notes (Signed)
   07/24/23 0555  15 Minute Checks  Location Bedroom  Visual Appearance Calm  Behavior Sleeping  Sleep (Behavioral Health Patients Only)  Calculate sleep? (Click Yes once per 24 hr at 0600 safety check) Yes  Documented sleep last 24 hours 7

## 2023-07-24 NOTE — BHH Group Notes (Signed)
BHH Group Notes:  (Nursing/MHT/Case Management/Adjunct)  Date:  07/24/2023  Time:  8:37 PM  Type of Therapy:   AA group  Participation Level:  Active  Participation Quality:  Appropriate  Affect:  Appropriate  Cognitive:  Appropriate  Insight:  Appropriate  Engagement in Group:  Engaged  Modes of Intervention:  Education  Summary of Progress/Problems: Attended AA meeting.  Brian Nolan 07/24/2023, 8:37 PM

## 2023-07-24 NOTE — Plan of Care (Signed)
Problem: Education: Goal: Emotional status will improve Outcome: Progressing Goal: Mental status will improve Outcome: Progressing Goal: Verbalization of understanding the information provided will improve Outcome: Progressing   Problem: Activity: Goal: Interest or engagement in activities will improve Outcome: Progressing Goal: Sleeping patterns will improve Outcome: Progressing   Problem: Coping: Goal: Ability to demonstrate self-control will improve Outcome: Progressing   Problem: Safety: Goal: Periods of time without injury will increase Outcome: Progressing

## 2023-07-24 NOTE — Progress Notes (Addendum)
Patient presents with anxiety and request help with sleep.  Administered PRN Hydroxyzine and Trazodone per [facility] per patient request.

## 2023-07-24 NOTE — BHH Suicide Risk Assessment (Signed)
[facility] Admission Suicide Risk Assessment   Nursing information obtained from:  Patient Demographic factors:  Male, Low socioeconomic status, Unemployed, Adolescent or young adult Current Mental Status:  Self-harm behaviors, Suicidal ideation indicated by patient Loss Factors:  Decrease in vocational status, Financial problems / change in socioeconomic status Historical Factors:  Impulsivity Risk Reduction Factors:  Positive social support, Sense of responsibility to family  Total Time spent with patient: 30 minutes Principal Problem: <principal problem not specified> Diagnosis:  Active Problems:   MDD (major depressive disorder), recurrent severe, without psychosis (HCC)  Subjective Data: 42 year old male admitted with history of substance abuse, schizophrenia, depression who presents to the ED for an evaluation of suicidal ideations.  Continued Clinical Symptoms:  Alcohol Use Disorder Identification Test Final Score (AUDIT): 18 The "Alcohol Use Disorders Identification Test", Guidelines for Use in Primary Care, Second Edition.  World [employer] writer [facility] [address]). Score between 0-7:  no or low risk or alcohol related problems. Score between 8-15:  moderate risk of alcohol related problems. Score between 16-19:  high risk of alcohol related problems. Score 20 or above:  warrants further diagnostic evaluation for alcohol dependence and treatment.   CLINICAL FACTORS:   Severe Anxiety and/or Agitation Alcohol/Substance Abuse/Dependencies Schizophrenia:   Depressive state Personality Disorders:   Cluster C Unstable or Poor Therapeutic Relationship Previous Psychiatric Diagnoses and Treatments   Musculoskeletal: Strength & Muscle Tone: within normal limits Gait & Station: normal Patient leans: N/A  Psychiatric Specialty Exam:  Presentation  General Appearance:  Appropriate for Environment  Eye Contact: Fleeting  Speech: Clear and Coherent  Speech  Volume: Increased  Handedness: Right   Mood and Affect  Mood: Irritable  Affect: Appropriate   Thought Process  Thought Processes: Coherent  Descriptions of Associations:Intact  Orientation:Full (Time, Place and Person)  Thought Content:Logical; Obsessions; Rumination  History of Schizophrenia/Schizoaffective disorder:No  Duration of Psychotic Symptoms:N/A  Hallucinations:Hallucinations: None  Ideas of Reference:None  Suicidal Thoughts:Suicidal Thoughts: Yes, Passive  Homicidal Thoughts:Homicidal Thoughts: No   Sensorium  Memory: Immediate Good; Recent Fair; Remote Fair  Judgment: Fair  Insight: Fair   Art therapist  Concentration: Fair  Attention Span: Fair  Recall: [employer] of Knowledge: Fair  Language: Fair   Psychomotor Activity  Psychomotor Activity: Psychomotor Activity: Normal   Assets  Assets: Communication Skills; Desire for Improvement; Social Support; Housing   Sleep  Sleep: Sleep: Poor    Physical Exam: Physical Exam Constitutional:      Appearance: Normal appearance.  Neurological:     General: No focal deficit present.     Mental Status: He is alert and oriented to person, place, and time. Mental status is at baseline.    Review of Systems  Psychiatric/Behavioral:  Positive for depression, substance abuse and suicidal ideas. The patient is nervous/anxious.    Blood pressure (!) 127/95, pulse 88, temperature 97.7 F (36.5 C), temperature source Oral, resp. rate 18, height 5\' 9"  (1.753 m), weight 107.5 kg, SpO2 100%. Body mass index is 35 kg/m.   COGNITIVE FEATURES THAT CONTRIBUTE TO RISK:  Polarized thinking and Thought constriction (tunnel vision)    SUICIDE RISK:   Moderate:  Frequent suicidal ideation with limited intensity, and duration, some specificity in terms of plans, no associated intent, good self-control, limited dysphoria/symptomatology, some risk factors present, and identifiable  protective factors, including available and accessible social support.  PLAN OF CARE: The patient is admitted to [facility] in a safe and secure environment for further observation and aggressive treatment of his depression,  suicidal ideations and substance use disorder.  He is contracting for safety while in the hospital.  I certify that inpatient services furnished can reasonably be expected to improve the patient's condition.   Rex Kras, MD 07/24/2023, 1:43 PM

## 2023-07-24 NOTE — Plan of Care (Signed)
Problem: Education: Goal: Knowledge of [place] [facility] Education information/materials will improve Outcome: Progressing Goal: Emotional status will improve Outcome: Not Progressing Goal: Mental status will improve Outcome: Not Progressing

## 2023-07-24 NOTE — BH IP Treatment Plan (Signed)
Interdisciplinary Treatment and Diagnostic Plan Update  07/24/2023 Time of Session: 10:35AM Brian Nolan MRN: [id number]  Principal Diagnosis: <principal problem not specified>  Secondary Diagnoses: Active Problems:   MDD (major depressive disorder), recurrent severe, without psychosis (HCC)   Current Medications:  Current Facility-Administered Medications  Medication Dose Route Frequency Provider Last Rate Last Admin   acetaminophen (TYLENOL) tablet 650 mg  650 mg Oral Q6H PRN Motley-Mangrum, Jadeka A, PMHNP       alum & mag hydroxide-simeth (MAALOX/MYLANTA) 200-200-20 MG/5ML suspension 30 mL  30 mL Oral Q4H PRN Motley-Mangrum, Jadeka A, PMHNP       hydrOXYzine (ATARAX) tablet 25 mg  25 mg Oral TID PRN Motley-Mangrum, Jadeka A, PMHNP   25 mg at 07/23/23 2113   magnesium hydroxide (MILK OF MAGNESIA) suspension 30 mL  30 mL Oral Daily PRN Motley-Mangrum, Jadeka A, PMHNP       OLANZapine (ZYPREXA) injection 5 mg  5 mg Intramuscular TID PRN Motley-Mangrum, Jadeka A, PMHNP       traZODone (DESYREL) tablet 50 mg  50 mg Oral QHS PRN Motley-Mangrum, Jadeka A, PMHNP   50 mg at 07/23/23 2113   PTA Medications: Medications Prior to Admission  Medication Sig Dispense Refill Last Dose   hydrOXYzine (ATARAX) 25 MG tablet Take 1 tablet (25 mg total) by mouth 3 (three) times daily as needed for anxiety. (Patient not taking: Reported on 07/24/2023) 90 tablet 3 Not Taking   pantoprazole (PROTONIX) 40 MG tablet Take 1 tablet (40 mg total) by mouth daily. (Patient not taking: Reported on 07/24/2023) 30 tablet 3 Not Taking   QUEtiapine (SEROQUEL) 50 MG tablet Take 1 tablet (50 mg total) by mouth at bedtime. (Patient not taking: Reported on 07/24/2023) 30 tablet 3 Not Taking   sertraline (ZOLOFT) 100 MG tablet Take 1 tablet (100 mg total) by mouth daily. (Patient not taking: Reported on 07/24/2023) 30 tablet 3 Not Taking   traZODone (DESYREL) 50 MG tablet Take 1 tablet (50 mg total) by mouth at  bedtime as needed for sleep. (Patient not taking: Reported on 07/24/2023) 30 tablet 3 Not Taking    Patient Stressors: Financial difficulties   Medication change or noncompliance   Occupational concerns   Substance abuse    Patient Strengths: Capable of independent living  Communication skills  Religious Affiliation  Supportive family/friends  Work skills   Treatment Modalities: Medication Management, Group therapy, Case management,  1 to 1 session with clinician, Psychoeducation, Recreational therapy.   Physician Treatment Plan for Primary Diagnosis: <principal problem not specified> Long Term Goal(s):     Short Term Goals:    Medication Management: Evaluate patient's response, side effects, and tolerance of medication regimen.  Therapeutic Interventions: 1 to 1 sessions, Unit Group sessions and Medication administration.  Evaluation of Outcomes: Not Progressing  Physician Treatment Plan for Secondary Diagnosis: Active Problems:   MDD (major depressive disorder), recurrent severe, without psychosis (HCC)  Long Term Goal(s):     Short Term Goals:       Medication Management: Evaluate patient's response, side effects, and tolerance of medication regimen.  Therapeutic Interventions: 1 to 1 sessions, Unit Group sessions and Medication administration.  Evaluation of Outcomes: Not Progressing   RN Treatment Plan for Primary Diagnosis: <principal problem not specified> Long Term Goal(s): Knowledge of disease and therapeutic regimen to maintain health will improve  Short Term Goals: Ability to remain free from injury will improve, Ability to verbalize frustration and anger appropriately will improve, Ability to demonstrate self-control,  Ability to participate in decision making will improve, Ability to verbalize feelings will improve, Ability to disclose and discuss suicidal ideas, Ability to identify and develop effective coping behaviors will improve, and Compliance with  prescribed medications will improve  Medication Management: RN will administer medications as ordered by provider, will assess and evaluate patient's response and provide education to patient for prescribed medication. RN will report any adverse and/or side effects to prescribing provider.  Therapeutic Interventions: 1 on 1 counseling sessions, Psychoeducation, Medication administration, Evaluate responses to treatment, Monitor vital signs and CBGs as ordered, Perform/monitor CIWA, COWS, AIMS and Fall Risk screenings as ordered, Perform wound care treatments as ordered.  Evaluation of Outcomes: Not Progressing   LCSW Treatment Plan for Primary Diagnosis: <principal problem not specified> Long Term Goal(s): Safe transition to appropriate next level of care at discharge, Engage patient in therapeutic group addressing interpersonal concerns.  Short Term Goals: Engage patient in aftercare planning with referrals and resources, Increase social support, Increase ability to appropriately verbalize feelings, Increase emotional regulation, Facilitate acceptance of mental health diagnosis and concerns, Facilitate patient progression through stages of change regarding substance use diagnoses and concerns, Identify triggers associated with mental health/substance abuse issues, and Increase skills for wellness and recovery  Therapeutic Interventions: Assess for all discharge needs, 1 to 1 time with Social worker, Explore available resources and support systems, Assess for adequacy in community support network, Educate family and significant other(s) on suicide prevention, Complete Psychosocial Assessment, Interpersonal group therapy.  Evaluation of Outcomes: Not Progressing   Progress in Treatment: Attending groups: Yes. Participating in groups: Yes. Taking medication as prescribed: Pt not currently on medications Toleration medication: Pt not currently on medications Family/Significant other contact  made: No, will contact:  consent pending Patient understands diagnosis: Yes. Discussing patient identified problems/goals with staff: Yes. Medical problems stabilized or resolved: Yes. Denies suicidal/homicidal ideation: Yes. Issues/concerns per patient self-inventory: No.  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 CSW will continue to follow and assess for appropriate referrals and possible discharge planning.    Reason for Continuation of Hospitalization: Depression Medication stabilization Suicidal ideation Withdrawal symptoms  Estimated Length of Stay: 5-7 days  Last 3 [place] Suicide Severity Risk Score: Flowsheet Row [facility] Most recent reading at 07/23/2023  5:15 PM ED from 07/23/2023 in [facility] Emergency Department [facility] Most recent reading at 07/23/2023  8:46 AM Video Visit from 07/06/2023 in [facility] Most recent reading at 07/06/2023 10:18 AM  C-SSRS RISK CATEGORY Moderate Risk Moderate Risk Error: Question 6 not populated       Last [facility] 2/9 Scores:    07/06/2023   10:18 AM 05/31/2023    3:41 PM 12/25/2021   10:31 AM  Depression screen PHQ 2/9  Decreased Interest 0 3 0  Down, Depressed, Hopeless 1 3 0  PHQ - 2 Score 1 6 0  Altered sleeping 0 2   Tired, decreased energy 0 2   Change in appetite 0 1   Feeling bad or failure about yourself  2 2   Trouble concentrating 0 1   Moving slowly or fidgety/restless  0 0   Suicidal thoughts 0 2   PHQ-9 Score 3 16   Difficult doing work/chores Not  difficult at all Very difficult     Scribe for Treatment Team: Kathi Der, Theresia Majors 07/24/2023 12:51 PM

## 2023-07-24 NOTE — Progress Notes (Signed)
   07/23/23 2113  Psych Admission Type (Psych Patients Only)  Admission Status Voluntary  Psychosocial Assessment  Patient Complaints Anxiety;Depression;Substance abuse  Eye Contact Fair  Facial Expression Anxious;Worried  Affect Depressed;Irritable  Furniture conservator/restorer;Restless  Appearance/Hygiene Unremarkable  Behavior Characteristics Cooperative  Mood Depressed;Anxious  Thought Process  Coherency Circumstantial  Content Blaming self;Blaming others  Delusions None reported or observed  Perception WDL  Hallucination None reported or observed  Judgment Poor  Confusion None  Danger to Self  Current suicidal ideation? Denies  Danger to Others  Danger to Others None reported or observed  Danger to Others Abnormal  Harmful Behavior to others No threats or harm toward other people  Destructive Behavior No threats or harm toward property

## 2023-07-24 NOTE — H&P (Signed)
Psychiatric Admission Assessment Adult  Patient Identification: Brian Nolan MRN:  [id number] Date of Evaluation:  07/24/2023 Chief Complaint:  MDD (major depressive disorder), recurrent severe, without psychosis (HCC) [F33.2] Principal Diagnosis: <principal problem not specified> Diagnosis:  Active Problems:   MDD (major depressive disorder), recurrent severe, without psychosis (HCC) CC:42 year old male admitted through the emergency room for symptoms of depression and suicidal ideations.  This was in the context of substance use disorder. History of Present Illness: The patient reports that he requested help for worsening symptoms of depression and passive suicidal ideations.  Apparently his symptoms started a couple of months ago when he was discharged from this facility and he claims that he wanted to go back to work he was fired because he did not have a medical clearance.  He states that this led to his relapse and he has been drinking nearly and using drugs off and on.  He admits to increasing frustration and endorses using some drugs and although he denied using cocaine, he was positive for cocaine and marijuana. 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  He states that some of the medications for sedation made him have blurred vision.  He would rather not take those medications.  When seen today patient was alert oriented cooperative but depressed and frustrated.  He was labile and irritable and in treatment team reported that he would rather not be on any medications that would compromise his ability to drive.  He does want help and reports that he did fairly well until the 14th of the month which was the 1 year anniversary of his mother's death.  He states that then he lost it and cried a lot and did not know what he was doing.  The next day  his girlfriend encouraged him to seek help. Associated Signs/Symptoms: Depression Symptoms:  depressed mood, psychomotor agitation, difficulty concentrating, suicidal thoughts without plan, anxiety, (Hypo) Manic Symptoms:  Impulsivity, Irritable Mood, Anxiety Symptoms:  Excessive Worry, Psychotic Symptoms:   Denied any. PTSD Symptoms: Negative Total Time spent with patient: 30 minutes  Past Psychiatric History: Previously hospitalized at [facility] in September.  He was on Zoloft, Seroquel, trazodone.  Patient gives a history of major depression and grief reaction.  Is the patient at risk to self? Yes.    Has the patient been a risk to self in the past 6 months? Yes.    Has the patient been a risk to self within the distant past? Yes.    Is the patient a risk to others? No.  Has the patient been a risk to others in the past 6 months? No.  Has the patient been a risk to others within the distant past? No.   [place] Scale:  Flowsheet Row [facility] ([facility] Most recent reading at 07/23/2023  5:15 PM ED from 07/23/2023 in [facility] Department at [facility] Most recent reading at 07/23/2023  8:46 AM Video Visit from 07/06/2023 in [facility] Most recent reading at 07/06/2023 10:18 AM  C-SSRS RISK CATEGORY Moderate Risk Moderate Risk Error: Question 6 not populated        Prior Inpatient Therapy: Yes.   If yes, describe last hospitalized in September. Prior Outpatient Therapy: No. If yes, describe noncompliant with medications and  outpatient follow-up.  Alcohol Screening: 1. How often do you have a drink containing alcohol?: 2 to 4 times a month 2. How many drinks containing alcohol do you have on a typical day when you are drinking?: 5 or 6 3. How often do you have six or more drinks on one occasion?: Weekly AUDIT-C Score: 7 4. How often during the last  year have you found that you were not able to stop drinking once you had started?: Weekly 5. How often during the last year have you failed to do what was normally expected from you because of drinking?: Monthly 6. How often during the last year have you needed a first drink in the morning to get yourself going after a heavy drinking session?: Weekly 7. How often during the last year have you had a feeling of guilt of remorse after drinking?: Weekly 8. How often during the last year have you been unable to remember what happened the night before because you had been drinking?: Never 9. Have you or someone else been injured as a result of your drinking?: No 10. Has a relative or friend or a doctor or another health worker been concerned about your drinking or suggested you cut down?: No Alcohol Use Disorder Identification Test Final Score (AUDIT): 18 Alcohol Brief Interventions/Follow-up: Alcohol education/Brief advice Substance Abuse History in the last 12 months:  Yes.   Consequences of Substance Abuse: Family Consequences:  Increase conflicts, loss of job. Patient reports emotional lability. Previous Psychotropic Medications: Yes  Psychological Evaluations: No  Past Medical History:  Past Medical History:  Diagnosis Date   Depression    History of hiatal hernia    Schizophrenia (HCC)    Substance abuse (HCC)     Past Surgical History:  Procedure Laterality Date   NO PAST SURGERIES     Family History:  Family History  Problem Relation Age of Onset   Schizophrenia Maternal Uncle    Diabetes Mellitus II Father    Family Psychiatric  History: Unknown at this time. Tobacco Screening:  Social History   Tobacco Use  Smoking Status Every Day   Current packs/day: 1.00   Average packs/day: 1 pack/day for 14.5 years (14.5 ttl pk-yrs)   Types: Cigarettes   Start date: 01/31/2008   Last attempt to quit: 01/30/2018   Passive exposure: Never  Smokeless Tobacco Never    BH Tobacco  Counseling     Are you interested in Tobacco Cessation Medications?  No, patient refused Counseled patient on smoking cessation:  Refused/Declined practical counseling Reason Tobacco Screening Not Completed: No value filed.       Social History:  Social History   Substance and Sexual Activity  Alcohol Use Yes   Alcohol/week: 6.0 - 24.0 standard drinks of alcohol   Types: 6 - 24 Cans of beer per week   Comment: 12 ppd     Social History   Substance and Sexual Activity  Drug Use Yes   Types: "Crack" cocaine, Marijuana, Cocaine    Additional Social History:                           Allergies:  No Known Allergies Lab Results:  Results for orders placed or performed during the hospital encounter of 07/23/23 (from the past 48 hour(s))  Comprehensive metabolic panel     Status: Abnormal   Collection Time: 07/23/23  8:56 AM  Result Value Ref Range   Sodium 139 135 -  145 mmol/L   Potassium 3.5 3.5 - 5.1 mmol/L   Chloride 105 98 - 111 mmol/L   CO2 22 22 - 32 mmol/L   Glucose, Bld 104 (H) 70 - 99 mg/dL    Comment: Glucose reference range applies only to samples taken after fasting for at least 8 hours.   BUN 13 6 - 20 mg/dL   Creatinine, Ser 3.15 0.61 - 1.24 mg/dL   Calcium 8.8 (L) 8.9 - 10.3 mg/dL   Total Protein 6.7 6.5 - 8.1 g/dL   Albumin 3.5 3.5 - 5.0 g/dL   AST 20 15 - 41 U/L   ALT 21 0 - 44 U/L   Alkaline Phosphatase 83 38 - 126 U/L   Total Bilirubin 0.6 <1.2 mg/dL   GFR, Estimated >17 >61 mL/min    Comment: (NOTE) Calculated using the CKD-EPI Creatinine Equation (2021)    Anion gap 12 5 - 15    Comment: Performed at [facility], [address] [place], [place] [address]  Ethanol     Status: None   Collection Time: 07/23/23  8:56 AM  Result Value Ref Range   Alcohol, Ethyl (B) <10 <10 mg/dL    Comment: (NOTE) Lowest detectable limit for serum alcohol is 10 mg/dL.  For medical purposes only. Performed at [facility], [address] [place], [place] [address]   Salicylate level     Status: Abnormal   Collection Time: 07/23/23  8:56 AM  Result Value Ref Range   Salicylate Lvl <7.0 (L) 7.0 - 30.0 mg/dL    Comment: Performed at [facility], [address] [place], [place] [address]  Acetaminophen level     Status: Abnormal   Collection Time: 07/23/23  8:56 AM  Result Value Ref Range   Acetaminophen (Tylenol), Serum <10 (L) 10 - 30 ug/mL    Comment: (NOTE) Therapeutic concentrations vary significantly. A range of 10-30 ug/mL  may be an effective concentration for many patients. However, some  are best treated at concentrations outside of this range. Acetaminophen concentrations >150 ug/mL at 4 hours after ingestion  and >50 ug/mL at 12 hours after ingestion are often associated with  toxic reactions.  Performed at [facility], [address] [place], [place] [address]   cbc     Status: None   Collection Time: 07/23/23  8:56 AM  Result Value Ref Range   WBC 9.5 4.0 - 10.5 K/uL   RBC 5.21 4.22 - 5.81 MIL/uL   Hemoglobin 15.9 13.0 - 17.0 g/dL   HCT 50.0 93.8 - 18.2 %   MCV 89.1 80.0 - 100.0 fL   MCH 30.5 26.0 - 34.0 pg   MCHC 34.3 30.0 - 36.0 g/dL   RDW 99.3 71.6 - 96.7 %   Platelets 183 150 - 400 K/uL   nRBC 0.0 0.0 - 0.2 %    Comment: Performed at [facility], [address], [place], [place] [address]  Rapid urine drug screen (hospital performed)     Status: Abnormal   Collection Time: 07/23/23  8:56 AM  Result Value Ref Range   Opiates NONE DETECTED NONE DETECTED   Cocaine POSITIVE (A) NONE DETECTED   Benzodiazepines NONE DETECTED NONE DETECTED   Amphetamines NONE DETECTED NONE DETECTED   Tetrahydrocannabinol POSITIVE (A) NONE DETECTED   Barbiturates NONE DETECTED NONE DETECTED    Comment: (NOTE) DRUG SCREEN FOR MEDICAL PURPOSES ONLY.  IF CONFIRMATION IS NEEDED FOR ANY PURPOSE,  NOTIFY LAB WITHIN 5  DAYS.  LOWEST DETECTABLE LIMITS FOR URINE DRUG SCREEN Drug Class                     Cutoff (ng/mL) Amphetamine and metabolites    1000 Barbiturate and metabolites    200 Benzodiazepine                 200 Opiates and metabolites        300 Cocaine and metabolites        300 THC                            50 Performed at [facility], [address] [place], [place] [address]     Blood Alcohol level:  Lab Results  Component Value Date   [facility] <10 07/23/2023   ETH <10 05/31/2023    Metabolic Disorder Labs:  Lab Results  Component Value Date   HGBA1C 5.7 (H) 05/31/2023   MPG 116.89 05/31/2023   MPG 99.67 11/16/2021   No results found for: "PROLACTIN" Lab Results  Component Value Date   CHOL 185 06/02/2023   TRIG 443 (H) 06/02/2023   HDL 41 06/02/2023   CHOLHDL 4.5 06/02/2023   VLDL UNABLE TO CALCULATE IF TRIGLYCERIDE OVER 400 mg/dL 19/14/7829   LDLCALC UNABLE TO CALCULATE IF TRIGLYCERIDE OVER 400 mg/dL 56/21/3086   LDLCALC 89 11/16/2021    Current Medications: Current Facility-Administered Medications  Medication Dose Route Frequency Provider Last Rate Last Admin   acetaminophen (TYLENOL) tablet 650 mg  650 mg Oral Q6H PRN Motley-Mangrum, Jadeka A, PMHNP       alum & mag hydroxide-simeth (MAALOX/MYLANTA) 200-200-20 MG/5ML suspension 30 mL  30 mL Oral Q4H PRN Motley-Mangrum, Jadeka A, PMHNP       hydrOXYzine (ATARAX) tablet 25 mg  25 mg Oral TID PRN Motley-Mangrum, Jadeka A, PMHNP   25 mg at 07/23/23 2113   magnesium hydroxide (MILK OF MAGNESIA) suspension 30 mL  30 mL Oral Daily PRN Motley-Mangrum, Jadeka A, PMHNP       OLANZapine (ZYPREXA) injection 5 mg  5 mg Intramuscular TID PRN Motley-Mangrum, Jadeka A, PMHNP       traZODone (DESYREL) tablet 50 mg  50 mg Oral QHS PRN Motley-Mangrum, Jadeka A, PMHNP   50 mg at 07/23/23 2113   PTA Medications: Medications Prior to Admission  Medication Sig Dispense Refill Last Dose   hydrOXYzine (ATARAX) 25 MG  tablet Take 1 tablet (25 mg total) by mouth 3 (three) times daily as needed for anxiety. (Patient not taking: Reported on 07/24/2023) 90 tablet 3 Not Taking   pantoprazole (PROTONIX) 40 MG tablet Take 1 tablet (40 mg total) by mouth daily. (Patient not taking: Reported on 07/24/2023) 30 tablet 3 Not Taking   QUEtiapine (SEROQUEL) 50 MG tablet Take 1 tablet (50 mg total) by mouth at bedtime. (Patient not taking: Reported on 07/24/2023) 30 tablet 3 Not Taking   sertraline (ZOLOFT) 100 MG tablet Take 1 tablet (100 mg total) by mouth daily. (Patient not taking: Reported on 07/24/2023) 30 tablet 3 Not Taking   traZODone (DESYREL) 50 MG tablet Take 1 tablet (50 mg total) by mouth at bedtime as needed for sleep. (Patient not taking: Reported on 07/24/2023) 30 tablet 3 Not Taking    Musculoskeletal: Strength & Muscle Tone: within normal limits Gait & Station: normal Patient leans: N/A  Psychiatric Specialty Exam:  Presentation  General Appearance:  Disheveled; Casual  Eye Contact: Fair  Speech: Normal Rate  Speech Volume: Increased  Handedness: Right   Mood and Affect  Mood: Anxious; Irritable; Labile  Affect: Full Range; Labile   Thought Process  Thought Processes: Coherent  Duration of Psychotic Symptoms:N/A Past Diagnosis of Schizophrenia or Psychoactive disorder: Yes  Descriptions of Associations:Tangential  Orientation:Full (Time, Place and Person)  Thought Content:Perseveration; Rumination  Hallucinations:Hallucinations: Auditory Description of Auditory Hallucinations: Possible command hallucinations.  Ideas of Reference:Delusions  Suicidal Thoughts:Suicidal Thoughts: No  Homicidal Thoughts:Homicidal Thoughts: No   Sensorium  Memory: Immediate Fair; Remote Fair; Recent Fair  Judgment: Poor  Insight: Fair   Chartered certified accountant: Fair  Attention Span: Fair  Recall: [employer] of  Knowledge: Fair  Language: Fair   Psychomotor Activity  Psychomotor Activity: Psychomotor Activity: Normal   Assets  Assets: Manufacturing systems engineer; Desire for Improvement   Sleep  Sleep: Sleep: Fair    Physical Exam: Physical Exam Constitutional:      Appearance: Normal appearance. He is normal weight.  Neurological:     General: No focal deficit present.     Mental Status: He is alert and oriented to person, place, and time. Mental status is at baseline.  Psychiatric:        Mood and Affect: Mood normal.    Review of Systems  Psychiatric/Behavioral:  Positive for depression and substance abuse. The patient is nervous/anxious.   All other systems reviewed and are negative.  Blood pressure (!) 127/95, pulse 88, temperature 97.7 F (36.5 C), temperature source Oral, resp. rate 18, height 5\' 9"  (1.753 m), weight 107.5 kg, SpO2 100%. Body mass index is 35 kg/m.  Treatment Plan Summary: ASSESSMENT:  Diagnoses / Active Problems: Major depression recurrent with suicidal ideations Substance use disorder  PLAN: Safety and Monitoring:  --  Voluntary admission to inpatient psychiatric unit for safety, stabilization and treatment  -- Daily contact with patient to assess and evaluate symptoms and progress in treatment  -- Patient's case to be discussed in multi-disciplinary team meeting  -- Observation Level : q15 minute checks  -- Vital signs:  q12 hours  -- Precautions: suicide, elopement, and assault  2. Psychiatric Diagnoses and Treatment:    --  The risks/benefits/side-effects/alternatives to this medication were discussed in detail with the patient and time was given for questions. The patient consents to medication trial.  -- Patient refused to take any sedating medications but is willing to restart Zoloft at 50 mg a day  -- Metabolic profile and EKG monitoring obtained while on an atypical antipsychotic (BMI: Lipid Panel: HbgA1c: QTc:) as indicated  --  Encouraged patient to participate in unit milieu and in scheduled group therapies   -- Short Term Goals: Ability to identify changes in lifestyle to reduce recurrence of condition will improve, Ability to verbalize feelings will improve, Ability to disclose and discuss suicidal ideas, Ability to demonstrate self-control will improve, and Ability to identify triggers associated with substance abuse/mental health issues will improve  -- Long Term Goals: Improvement in symptoms so as ready for discharge    3. Medical Issues Being Addressed:   Tobacco Use Disorder  -- Nicotine patch 21mg /24 hours ordered  -- Smoking cessation encouraged  4. Discharge Planning:   -- Social work and case management to assist with discharge planning and identification of hospital follow-up needs prior to discharge  -- Estimated LOS: 5-7 days  -- Discharge Concerns: Need to establish a  safety plan; Medication compliance and effectiveness  -- Discharge Goals: Return home with outpatient referrals for mental health follow-up including medication management/psychotherapy   Observation Level/Precautions:  15 minute checks  Laboratory:  CBC Chemistry Profile HbAIC  Psychotherapy:    Medications: Zoloft 50 mg a day.  Consultations:    Discharge Concerns: Safety plan.  Estimated LOS: 5 to 7 days.  Other:       I certify that inpatient services furnished can reasonably be expected to improve the patient's condition.    Rex Kras, MD 11/22/20241:47 PM

## 2023-07-25 DIAGNOSIS — F332 Major depressive disorder, recurrent severe without psychotic features: Secondary | ICD-10-CM | POA: Diagnosis not present

## 2023-07-25 MED ORDER — ONDANSETRON 4 MG PO TBDP
ORAL_TABLET | ORAL | Status: AC
  Filled 2023-07-25: qty 1

## 2023-07-25 MED ORDER — ONDANSETRON 4 MG PO TBDP
4.0000 mg | ORAL_TABLET | Freq: Once | ORAL | Status: AC
  Administered 2023-07-25: 4 mg via ORAL
  Filled 2023-07-25: qty 1

## 2023-07-25 NOTE — Progress Notes (Signed)
Pt reports vomiting twice today.

## 2023-07-25 NOTE — Progress Notes (Signed)
Psychiatric progress note adult  Patient Identification: Brian Nolan MRN:  [id number] Date of Evaluation:  07/25/2023 Chief Complaint:  MDD (major depressive disorder), recurrent severe, without psychosis (HCC) [F33.2] Principal Diagnosis: <principal problem not specified> Diagnosis:  Active Problems:   MDD (major depressive disorder), recurrent severe, without psychosis (HCC)   Reason for admission   42 year old male admitted through the emergency room for symptoms of depression and suicidal ideations.  This was in the context of substance use disorder.  Chart review from last 24 hours   Staff reports that the patient was cooperative and compliant.  He slept 7.75 hours.  No behavioral issues are noted.  He is attending groups.  He received hydroxyzine and trazodone for sleep.  Yesterday, the psychiatry team made the following recommendations:  Zoloft 50 mg a day.  Information obtained during interview   Patient was seen and evaluated today.  He was alert oriented and cooperative and fairly pleasant.  He maintained fair to good eye contact.  He reports that he took the Zoloft with no side effects yesterday and today.  His mood is 3/10 anxiety at a 5/10.  He denies any current cravings and reports that his suicidal ideations she is a 1/10.  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  Associated Signs/Symptoms: Depression Symptoms:  depressed mood, psychomotor agitation, difficulty concentrating, suicidal thoughts without plan, anxiety, (Hypo) Manic Symptoms:  Impulsivity, Irritable Mood, Anxiety Symptoms:  Excessive Worry, Psychotic Symptoms:   Denied any. PTSD Symptoms: Negative Total Time spent with patient: 30 minutes  Past Psychiatric History: Previously hospitalized at [facility] behavioral health in September.   He was on Zoloft, Seroquel, trazodone.  Patient gives a history of major depression and grief reaction.  Is the patient at risk to self? Yes.    Has the patient been a risk to self in the past 6 months? Yes.    Has the patient been a risk to self within the distant past? Yes.    Is the patient a risk to others? No.  Has the patient been a risk to others in the past 6 months? No.  Has the patient been a risk to others within the distant past? No.   [place] Scale:  Flowsheet Row [facility] Most recent reading at 07/23/2023  5:15 PM ED from 07/23/2023 in [facility] Emergency Department at [facility] Most recent reading at 07/23/2023  8:46 AM Video Visit from 07/06/2023 in [facility] Most recent reading at 07/06/2023 10:18 AM  C-SSRS RISK CATEGORY Moderate Risk Moderate Risk Error: Question 6 not populated        Prior Inpatient Therapy: Yes.   If yes, describe last hospitalized in September. Prior Outpatient Therapy: No. If yes, describe noncompliant with medications and outpatient follow-up.  Alcohol Screening: 1. How often do you have a drink containing alcohol?: 2 to 4 times a month 2. How many drinks containing alcohol do you have on a typical day when you are drinking?: 5 or 6 3. How often do you have six or more drinks on one occasion?: Weekly AUDIT-C Score: 7 4. How often during the last year have you found that you were not able to stop drinking once you had started?: Weekly 5. How often during  the last year have you failed to do what was normally expected from you because of drinking?: Monthly 6. How often during the last year have you needed a first drink in the morning to get yourself going after a heavy drinking session?: Weekly 7. How often during the last year have you had a feeling of guilt of remorse after drinking?: Weekly 8. How often during the last year have  you been unable to remember what happened the night before because you had been drinking?: Never 9. Have you or someone else been injured as a result of your drinking?: No 10. Has a relative or friend or a doctor or another health worker been concerned about your drinking or suggested you cut down?: No Alcohol Use Disorder Identification Test Final Score (AUDIT): 18 Alcohol Brief Interventions/Follow-up: Alcohol education/Brief advice Substance Abuse History in the last 12 months:  Yes.   Consequences of Substance Abuse: Family Consequences:  Increase conflicts, loss of job. Patient reports emotional lability. Previous Psychotropic Medications: Yes  Psychological Evaluations: No  Past Medical History:  Past Medical History:  Diagnosis Date   Depression    History of hiatal hernia    Schizophrenia (HCC)    Substance abuse (HCC)     Past Surgical History:  Procedure Laterality Date   NO PAST SURGERIES     Family History:  Family History  Problem Relation Age of Onset   Schizophrenia Maternal Uncle    Diabetes Mellitus II Father    Family Psychiatric  History: Unknown at this time. Tobacco Screening:  Social History   Tobacco Use  Smoking Status Every Day   Current packs/day: 1.00   Average packs/day: 1 pack/day for 14.5 years (14.5 ttl pk-yrs)   Types: Cigarettes   Start date: 01/31/2008   Last attempt to quit: 01/30/2018   Passive exposure: Never  Smokeless Tobacco Never    BH Tobacco Counseling     Are you interested in Tobacco Cessation Medications?  No, patient refused Counseled patient on smoking cessation:  Refused/Declined practical counseling Reason Tobacco Screening Not Completed: No value filed.       Social History:  Social History   Substance and Sexual Activity  Alcohol Use Yes   Alcohol/week: 6.0 - 24.0 standard drinks of alcohol   Types: 6 - 24 Cans of beer per week   Comment: 12 ppd     Social History   Substance and Sexual Activity  Drug Use  Yes   Types: "Crack" cocaine, Marijuana, Cocaine    Additional Social History: Marital status: Long term relationship Long term relationship, how long?: 2 plus years What types of issues is patient dealing with in the relationship?: "current situations due to my choices " Are you sexually active?: Yes Does patient have children?: Yes How many children?: 2 How is patient's relationship with their children?: "good with my oldest one and my oldest girl i need to work on"                         Allergies:  No Known Allergies Lab Results:  No results found for this or any previous visit (from the past 48 hour(s)).   Blood Alcohol level:  Lab Results  Component Value Date   [facility] <10 07/23/2023   ETH <10 05/31/2023    Metabolic Disorder Labs:  Lab Results  Component Value Date   HGBA1C 5.7 (H) 05/31/2023   MPG 116.89 05/31/2023   MPG 99.67 11/16/2021  No results found for: "PROLACTIN" Lab Results  Component Value Date   CHOL 185 06/02/2023   TRIG 443 (H) 06/02/2023   HDL 41 06/02/2023   CHOLHDL 4.5 06/02/2023   VLDL UNABLE TO CALCULATE IF TRIGLYCERIDE OVER 400 mg/dL 09/03/7251   LDLCALC UNABLE TO CALCULATE IF TRIGLYCERIDE OVER 400 mg/dL 66/44/0347   LDLCALC 89 11/16/2021    Current Medications: Current Facility-Administered Medications  Medication Dose Route Frequency Provider Last Rate Last Admin   acetaminophen (TYLENOL) tablet 650 mg  650 mg Oral Q6H PRN Motley-Mangrum, Jadeka A, PMHNP       alum & mag hydroxide-simeth (MAALOX/MYLANTA) 200-200-20 MG/5ML suspension 30 mL  30 mL Oral Q4H PRN Motley-Mangrum, Jadeka A, PMHNP       hydrOXYzine (ATARAX) tablet 25 mg  25 mg Oral TID PRN Motley-Mangrum, Jadeka A, PMHNP   25 mg at 07/24/23 2115   magnesium hydroxide (MILK OF MAGNESIA) suspension 30 mL  30 mL Oral Daily PRN Motley-Mangrum, Jadeka A, PMHNP       OLANZapine (ZYPREXA) injection 5 mg  5 mg Intramuscular TID PRN Motley-Mangrum, Jadeka A, PMHNP        sertraline (ZOLOFT) tablet 50 mg  50 mg Oral Daily Rex Kras, MD   50 mg at 07/25/23 0840   traZODone (DESYREL) tablet 50 mg  50 mg Oral QHS PRN Motley-Mangrum, Jadeka A, PMHNP   50 mg at 07/24/23 2115   PTA Medications: Medications Prior to Admission  Medication Sig Dispense Refill Last Dose   hydrOXYzine (ATARAX) 25 MG tablet Take 1 tablet (25 mg total) by mouth 3 (three) times daily as needed for anxiety. (Patient not taking: Reported on 07/24/2023) 90 tablet 3 Not Taking   pantoprazole (PROTONIX) 40 MG tablet Take 1 tablet (40 mg total) by mouth daily. (Patient not taking: Reported on 07/24/2023) 30 tablet 3 Not Taking   QUEtiapine (SEROQUEL) 50 MG tablet Take 1 tablet (50 mg total) by mouth at bedtime. (Patient not taking: Reported on 07/24/2023) 30 tablet 3 Not Taking   sertraline (ZOLOFT) 100 MG tablet Take 1 tablet (100 mg total) by mouth daily. (Patient not taking: Reported on 07/24/2023) 30 tablet 3 Not Taking   traZODone (DESYREL) 50 MG tablet Take 1 tablet (50 mg total) by mouth at bedtime as needed for sleep. (Patient not taking: Reported on 07/24/2023) 30 tablet 3 Not Taking    Musculoskeletal: Strength & Muscle Tone: within normal limits Gait & Station: normal Patient leans: N/A            Psychiatric Specialty Exam:  Presentation  General Appearance:  Casual  Eye Contact: Good  Speech: Clear and Coherent  Speech Volume: Normal  Handedness: Right   Mood and Affect  Mood: Depressed  Affect: Restricted   Thought Process  Thought Processes: Coherent  Duration of Psychotic Symptoms:N/A Past Diagnosis of Schizophrenia or Psychoactive disorder: No  Descriptions of Associations:Intact  Orientation:Full (Time, Place and Person)  Thought Content:Logical  Hallucinations:Hallucinations: None Description of Auditory Hallucinations: Possible command hallucinations.  Ideas of Reference:None  Suicidal Thoughts:Suicidal Thoughts:  No  Homicidal Thoughts:Homicidal Thoughts: No   Sensorium  Memory: Immediate Fair; Recent Fair; Remote Fair  Judgment: Fair  Insight: Fair   Chartered certified accountant: Fair  Attention Span: Fair  Recall: [employer] of Knowledge: Fair  Language: Fair   Psychomotor Activity  Psychomotor Activity: Psychomotor Activity: Normal   Assets  Assets: Manufacturing systems engineer; Desire for Improvement; Housing; Resilience; Talents/Skills   Sleep  Sleep: Sleep: Good Number  of Hours of Sleep: 7.75    Physical Exam: Physical Exam Constitutional:      Appearance: Normal appearance. He is normal weight.  Neurological:     General: No focal deficit present.     Mental Status: He is alert and oriented to person, place, and time. Mental status is at baseline.  Psychiatric:        Mood and Affect: Mood normal.        Behavior: Behavior normal.        Thought Content: Thought content normal.    Review of Systems  Psychiatric/Behavioral:  Positive for depression. Negative for substance abuse. The patient is not nervous/anxious.   All other systems reviewed and are negative.  Blood pressure (!) 132/99, pulse 93, temperature 98 F (36.7 C), temperature source Oral, resp. rate 18, height 5\' 9"  (1.753 m), weight 107.5 kg, SpO2 100%. Body mass index is 35 kg/m.  Treatment Plan Summary: ASSESSMENT:  Diagnoses / Active Problems: Major depression recurrent with suicidal ideations Substance use disorder  PLAN: Safety and Monitoring:  --  Voluntary admission to inpatient psychiatric unit for safety, stabilization and treatment  -- Daily contact with patient to assess and evaluate symptoms and progress in treatment  -- Patient's case to be discussed in multi-disciplinary team meeting  -- Observation Level : q15 minute checks  -- Vital signs:  q12 hours  -- Precautions: suicide, elopement, and assault  2. Psychiatric Diagnoses and Treatment:    --  The  risks/benefits/side-effects/alternatives to this medication were discussed in detail with the patient and time was given for questions. The patient consents to medication trial.  -- Patient refused to take any sedating medications but is willing to restart Zoloft at 50 mg a day  -- Metabolic profile and EKG monitoring obtained while on an atypical antipsychotic (BMI: Lipid Panel: HbgA1c: QTc:) as indicated  -- Encouraged patient to participate in unit milieu and in scheduled group therapies   -- Short Term Goals: Ability to identify changes in lifestyle to reduce recurrence of condition will improve, Ability to verbalize feelings will improve, Ability to disclose and discuss suicidal ideas, Ability to demonstrate self-control will improve, and Ability to identify triggers associated with substance abuse/mental health issues will improve  -- Long Term Goals: Improvement in symptoms so as ready for discharge    3. Medical Issues Being Addressed:   Tobacco Use Disorder  -- Nicotine patch 21mg /24 hours ordered  -- Smoking cessation encouraged  4. Discharge Planning:   -- Social work and case management to assist with discharge planning and identification of hospital follow-up needs prior to discharge  -- Estimated LOS: 5-7 days  -- Discharge Concerns: Need to establish a safety plan; Medication compliance and effectiveness  -- Discharge Goals: Return home with outpatient referrals for mental health follow-up including medication management/psychotherapy   Observation Level/Precautions:  15 minute checks  Laboratory:  CBC Chemistry Profile HbAIC  Psychotherapy:    Medications: Zoloft 50 mg a day.  Consultations:    Discharge Concerns: Safety plan.  [facility].  Estimated LOS: 5 to 7 days.  Other:       I certify that inpatient services furnished can reasonably be expected to improve the patient's condition.    Rex Kras, MD 11/23/20249:58 AM Patient ID: [name], male   DOB: [birth year], 42 y.o.   MRN: [id number]

## 2023-07-25 NOTE — Group Note (Signed)
[facility] LCSW Group Therapy Note  Date/Time:    07/25/2023 10:00am-11:00am  Type of Therapy and Topic:  Group Therapy:  How To Lillard Anes and Forgive Self  Participation Level:  Active   Description of Group:  At the request of several group members, the focus of this group was to examine our tendency to be hyper-critical of self and how we need to show ourselves grace and even forgive ourselves at times.  CSW pointed out that refusing to show ourselves grace or forgive ourselves often leads to feelings of worthlessness, hopelessness, and shame.  Patients were guided to the concept that shame is universal and is worsened by being kept "in the dark," but improved by being "brought into the light."  A variety of shaming experiences were brought up and the group was able to identify the commonality of these experiences.  Research about shame being connected to 12 different areas of "not enough" for both men and women was mentioned and patients were encouraged to start change their internal dialog to "I am enough."  A song entitled the same was played and greatly appreciated by group members.  Therapeutic Goals Identify ways in which we tend to extend grace and forgiveness to others, but refuse to do so to ourselves.   Examine different areas of life in which humans in general tend to feel they are "not enough." Talk about the frequency with which we hide our shame and how that enlarges it. Allow patients to discuss their shame out loud in order to reduce its power. Practice the affirmation "I Am Enough" through song.  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  This was used ultimately to show the commonalities that we have without realizing it, with an emphasis that this also applies to our emotions being more common and pervasive than we know.  The patient  listened attentively during group and contributed no comments but showed with his eyes following the discussion that he was interested.  Therapeutic Modalities Processing Psychoeducation  Ambrose Mantle, LCSW 07/25/2023, 2:44 PM

## 2023-07-25 NOTE — [facility] Group Notes (Signed)
Pt did not attend goals group. 

## 2023-07-25 NOTE — [facility] Suicide Risk Assessment (Signed)
[facility] INPATIENT:  Family/Significant Other Suicide Prevention Education  Suicide Prevention Education:  Patient Refusal for Family/Significant Other Suicide Prevention Education: The patient Brian Nolan has refused to provide written consent for family/significant other to be provided Family/Significant Other Suicide Prevention Education during admission and/or prior to discharge.  Physician notified.  Suicide Prevention Education was reviewed thoroughly with patient, including risk factors, warning signs, and what to do.  Mobile Crisis services were described and that telephone number pointed out, with encouragement to patient to put this number in personal cell phone.  Brochure was provided to patient to share with natural supports.  Patient acknowledged the ways in which they are at risk, and how working through each of their issues can gradually start to reduce their risk factors.  Patient was encouraged to think of the information in the context of people in their own lives.  Patient denied having access to firearms  Patient verbalized understanding of information provided.  Patient endorsed a desire to live.     [name] LCSWA 07/25/2023, 9:44 AM
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
## 2023-07-25 NOTE — Plan of Care (Signed)
Problem: Education: Goal: Knowledge of [facility] Education information/materials will improve Outcome: Progressing Goal: Emotional status will improve Outcome: Progressing Goal: Mental status will improve Outcome: Progressing Goal: Verbalization of understanding the information provided will improve Outcome: Progressing   Problem: Activity: Goal: Interest or engagement in activities will improve Outcome: Progressing Goal: Sleeping patterns will improve Outcome: Progressing   Problem: Coping: Goal: Ability to verbalize frustrations and anger appropriately will improve Outcome: Progressing Goal: Ability to demonstrate self-control will improve Outcome: Progressing   Problem: Health Behavior/Discharge Planning: Goal: Identification of resources available to assist in meeting health care needs will improve Outcome: Progressing Goal: Compliance with treatment plan for underlying cause of condition will improve Outcome: Progressing   Problem: Physical Regulation: Goal: Ability to maintain clinical measurements within normal limits will improve Outcome: Progressing   Problem: Safety: Goal: Periods of time without injury will increase Outcome: Progressing   Problem: Education: Goal: Knowledge of [facility] Education information/materials will improve Outcome: Progressing Goal: Emotional status will improve Outcome: Progressing Goal: Mental status will improve Outcome: Progressing Goal: Verbalization of understanding the information provided will improve Outcome: Progressing   Problem: Activity: Goal: Interest or engagement in activities will improve Outcome: Progressing Goal: Sleeping patterns will improve Outcome: Progressing   Problem: Coping: Goal: Ability to verbalize frustrations and anger appropriately will improve Outcome: Progressing Goal: Ability to demonstrate self-control will improve Outcome: Progressing   Problem: Health  Behavior/Discharge Planning: Goal: Identification of resources available to assist in meeting health care needs will improve Outcome: Progressing Goal: Compliance with treatment plan for underlying cause of condition will improve Outcome: Progressing   Problem: Physical Regulation: Goal: Ability to maintain clinical measurements within normal limits will improve Outcome: Progressing   Problem: Safety: Goal: Periods of time without injury will increase Outcome: Progressing   Problem: Education: Goal: Knowledge of disease or condition will improve Outcome: Progressing Goal: Understanding of discharge needs will improve Outcome: Progressing   Problem: Health Behavior/Discharge Planning: Goal: Ability to identify changes in lifestyle to reduce recurrence of condition will improve Outcome: Progressing Goal: Identification of resources available to assist in meeting health care needs will improve Outcome: Progressing   Problem: Physical Regulation: Goal: Complications related to the disease process, condition or treatment will be avoided or minimized Outcome: Progressing   Problem: Safety: Goal: Ability to remain free from injury will improve Outcome: Progressing   Problem: Education: Goal: Ability to make informed decisions regarding treatment will improve Outcome: Progressing   Problem: Coping: Goal: Coping ability will improve Outcome: Progressing   Problem: Health Behavior/Discharge Planning: Goal: Identification of resources available to assist in meeting health care needs will improve Outcome: Progressing   Problem: Medication: Goal: Compliance with prescribed medication regimen will improve Outcome: Progressing   Problem: Self-Concept: Goal: Ability to disclose and discuss suicidal ideas will improve Outcome: Progressing Goal: Will verbalize positive feelings about self Outcome: Progressing Note: Patient is on track. Patient will maintain adherence     Problem: Education: Goal: Utilization of techniques to improve thought processes will improve Outcome: Progressing Goal: Knowledge of the prescribed therapeutic regimen will improve Outcome: Progressing   Problem: Activity: Goal: Interest or engagement in leisure activities will improve Outcome: Progressing Goal: Imbalance in normal sleep/wake cycle will improve Outcome: Progressing   Problem: Coping: Goal: Coping ability will improve Outcome: Progressing Goal: Will verbalize feelings Outcome: Progressing   Problem: Health Behavior/Discharge Planning: Goal: Ability to make decisions will improve Outcome: Progressing Goal: Compliance with therapeutic regimen will improve Outcome: Progressing  Problem: Role Relationship: Goal: Will demonstrate positive changes in social behaviors and relationships Outcome: Progressing   Problem: Safety: Goal: Ability to disclose and discuss suicidal ideas will improve Outcome: Progressing Goal: Ability to identify and utilize support systems that promote safety will improve Outcome: Progressing   Problem: Self-Concept: Goal: Will verbalize positive feelings about self Outcome: Progressing Goal: Level of anxiety will decrease Outcome: Progressing

## 2023-07-25 NOTE — Group Note (Signed)
Date:  07/25/2023 Time:  9:39 PM  Group Topic/Focus:  Wrap-Up Group:   The focus of this group is to help patients review their daily goal of treatment and discuss progress on daily workbooks.    Participation Level:  Did Not Attend  Participation Quality:   N/A  Affect:   N/A  Cognitive:   N/A  Insight: None  Engagement in Group:   N/A  Modes of Intervention:   N/A  Additional Comments:  Patient did not attend wrap up group.   Brian Nolan 07/25/2023, 9:39 PM

## 2023-07-25 NOTE — Progress Notes (Signed)
   07/25/23 1000  Psych Admission Type (Psych Patients Only)  Admission Status Voluntary  Psychosocial Assessment  Patient Complaints Anxiety;Depression  Eye Contact Fair  Facial Expression Anxious  Affect Anxious  Speech Logical/coherent  Interaction Assertive  Motor Activity Fidgety  Appearance/Hygiene Unremarkable  Behavior Characteristics Cooperative  Mood Depressed  Thought Process  Coherency Circumstantial  Content Blaming others  Delusions None reported or observed  Perception WDL  Hallucination None reported or observed  Judgment Limited  Confusion None  Danger to Self  Current suicidal ideation? Denies  Danger to Others Abnormal  Harmful Behavior to others No threats or harm toward other people

## 2023-07-25 NOTE — Progress Notes (Signed)
   07/24/23 2200  Psych Admission Type (Psych Patients Only)  Admission Status Voluntary  Psychosocial Assessment  Patient Complaints Anxiety  Eye Contact Fair  Facial Expression Anxious  Affect Anxious  Speech Logical/coherent  Interaction Assertive  Motor Activity Restless  Appearance/Hygiene Unremarkable  Behavior Characteristics Cooperative  Mood Depressed  Thought Process  Coherency Circumstantial  Content Blaming others  Delusions None reported or observed  Perception WDL  Hallucination None reported or observed  Judgment Poor  Confusion None  Danger to Self  Current suicidal ideation? Denies  Agreement Not to Harm Self Yes  Description of Agreement Verbal  Danger to Others Abnormal  Harmful Behavior to others No threats or harm toward other people

## 2023-07-26 DIAGNOSIS — F332 Major depressive disorder, recurrent severe without psychotic features: Secondary | ICD-10-CM | POA: Diagnosis not present

## 2023-07-26 MED ORDER — CLONIDINE HCL 0.1 MG PO TABS
0.1000 mg | ORAL_TABLET | Freq: Four times a day (QID) | ORAL | Status: DC | PRN
  Administered 2023-07-26 – 2023-08-02 (×3): 0.1 mg via ORAL
  Filled 2023-07-26 (×3): qty 1

## 2023-07-26 NOTE — [facility] Group Notes (Signed)
Wrap-Up Group: This group's main objectives are to assist patients in reviewing their daily treatment goals and talking about their progress on their daily workbooks. "What are you avoiding, facing?" was today's question. Have you determined that it's not time or that you're not ready to work on something that you know you need to?  Pt response " I want to get back to that happy place of where I know I was happy for those two years. I understand why I need the help and sometimes it takes an experience to make someone realize what they need. 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 I have to be there for my grandson and other family."

## 2023-07-26 NOTE — Progress Notes (Signed)
   07/26/23 0800  Psych Admission Type (Psych Patients Only)  Admission Status Voluntary  Psychosocial Assessment  Patient Complaints Anxiety;Depression  Eye Contact Fair  Facial Expression Anxious  Affect Anxious  Speech Logical/coherent  Interaction Assertive  Motor Activity Restless  Appearance/Hygiene Unremarkable  Behavior Characteristics Appropriate to situation  Mood Depressed;Anxious  Thought Process  Coherency WDL  Content WDL  Delusions None reported or observed  Perception WDL  Hallucination None reported or observed  Judgment Limited  Confusion None  Danger to Self  Current suicidal ideation? Denies  Self-Injurious Behavior No self-injurious ideation or behavior indicators observed or expressed   Agreement Not to Harm Self Yes  Description of Agreement verbal  Danger to Others  Danger to Others None reported or observed  Danger to Others Abnormal  Harmful Behavior to others No threats or harm toward other people  Destructive Behavior No threats or harm toward property

## 2023-07-26 NOTE — Progress Notes (Addendum)
Patient reported nausea/vomiting several times during the night. States that he has not eaten since lunch on the previous day. NP on call notified. Zofran administered w/o effect. Patient states that he is feeling better at this time, and will notify  with further emesis.   07/25/23 2046  Psych Admission Type (Psych Patients Only)  Admission Status Voluntary  Psychosocial Assessment  Patient Complaints Anxiety;Depression  Eye Contact Fair  Facial Expression Anxious  Affect Anxious  Speech Logical/coherent  Interaction Assertive  Motor Activity Restless  Appearance/Hygiene Unremarkable  Behavior Characteristics Appropriate to situation  Mood Depressed;Anxious  Thought Process  Coherency WDL  Content WDL  Delusions None reported or observed  Perception WDL  Hallucination None reported or observed  Judgment Limited  Confusion None  Danger to Self  Current suicidal ideation? Denies  Self-Injurious Behavior No self-injurious ideation or behavior indicators observed or expressed   Agreement Not to Harm Self Yes  Description of Agreement verbal  Danger to Others  Danger to Others None reported or observed

## 2023-07-26 NOTE — Progress Notes (Signed)
Psychiatric progress note adult  Patient Identification: Brian Nolan MRN:  [id number] Date of Evaluation:  07/26/2023 Chief Complaint:  MDD (major depressive disorder), recurrent severe, without psychosis (HCC) [F33.2] Principal Diagnosis: <principal problem not specified> Diagnosis:  Active Problems:   MDD (major depressive disorder), recurrent severe, without psychosis (HCC)   Reason for admission   42 year old male admitted through the emergency room for symptoms of depression and suicidal ideations.  This was in the context of substance use disorder.  Chart review from last 24 hours   Staff reports that the patient has been having nausea and vomiting overnight and received Zofran with some improvement.  He has not had lunch yesterday because of his nausea.However he has been cooperative and compliant and has been attending groups. He slept fair but remains weak and sedated this morning.  His blood pressure has been running high.  This morning it was 159/102.  Pulse was 66.  Yesterday, the psychiatry team made the following recommendations:  Zoloft 50 mg a day.  Information obtained during interview   Patient was seen and evaluated today.  He reports that he does not have a history of hypertension That he knows of.  He also reports that the drugs that he used was cocaine and marijuana and does not remember using any opioids.  He was alert oriented and cooperative but seemed to be having some symptoms of hypertension including mild dizziness. The plan is to put him on clonidine and monitor him closely.Marland Kitchen  He may have undiagnosed hypertension but if he is symptomatic he may need to be seen ASAP.  Associated Signs/Symptoms: Depression Symptoms:  depressed mood, psychomotor agitation, difficulty concentrating, suicidal thoughts without plan, anxiety, (Hypo) Manic Symptoms:  Impulsivity, Irritable Mood, Anxiety Symptoms:  Excessive Worry, Psychotic Symptoms:   Denied  any. PTSD Symptoms: Negative Total Time spent with patient: 30 minutes  Past Psychiatric History: Previously hospitalized at [facility] behavioral [facility] in September.  He was on Zoloft, Seroquel, trazodone.  Patient gives a history of major depression and grief reaction.  Is the patient at risk to self? Yes.    Has the patient been a risk to self in the past 6 months? Yes.    Has the patient been a risk to self within the distant past? Yes.    Is the patient a risk to others? No.  Has the patient been a risk to others in the past 6 months? No.  Has the patient been a risk to others within the distant past? No.   [place] Scale:  Flowsheet Row [facility] Most recent reading at 07/23/2023  5:15 PM ED from 07/23/2023 in [facility] Emergency Department at [facility] Most recent reading at 07/23/2023  8:46 AM Video Visit from 07/06/2023 in [facility] Most recent reading at 07/06/2023 10:18 AM  C-SSRS RISK CATEGORY Moderate Risk Moderate Risk Error: Question 6 not populated        Prior Inpatient Therapy: Yes.   If yes, describe last hospitalized in September. Prior Outpatient Therapy: No. If yes, describe noncompliant with medications and outpatient follow-up.  Alcohol Screening: 1. How often do you have a drink containing alcohol?: 2 to 4 times a month 2. How many drinks containing alcohol do you have on a typical day when you are drinking?: 5 or 6 3. How often do you have six or more drinks on one occasion?: Weekly AUDIT-C Score: 7 4. How  often during the last year have you found that you were not able to stop drinking once you had started?: Weekly 5. How often during the last year have you failed to do what was normally expected from you because of drinking?: Monthly 6. How often during the last year have you needed a first drink in the morning to get yourself going after a heavy  drinking session?: Weekly 7. How often during the last year have you had a feeling of guilt of remorse after drinking?: Weekly 8. How often during the last year have you been unable to remember what happened the night before because you had been drinking?: Never 9. Have you or someone else been injured as a result of your drinking?: No 10. Has a relative or friend or a doctor or another health worker been concerned about your drinking or suggested you cut down?: No Alcohol Use Disorder Identification Test Final Score (AUDIT): 18 Alcohol Brief Interventions/Follow-up: Alcohol education/Brief advice Substance Abuse History in the last 12 months:  Yes.   Consequences of Substance Abuse: Family Consequences:  Increase conflicts, loss of job. Patient reports emotional lability. Previous Psychotropic Medications: Yes  Psychological Evaluations: No  Past Medical History:  Past Medical History:  Diagnosis Date   Depression    History of hiatal hernia    Schizophrenia (HCC)    Substance abuse (HCC)     Past Surgical History:  Procedure Laterality Date   NO PAST SURGERIES     Family History:  Family History  Problem Relation Age of Onset   Schizophrenia Maternal Uncle    Diabetes Mellitus II Father    Family Psychiatric  History: Unknown at this time. Tobacco Screening:  Social History   Tobacco Use  Smoking Status Every Day   Current packs/day: 1.00   Average packs/day: 1 pack/day for 14.5 years (14.5 ttl pk-yrs)   Types: Cigarettes   Start date: 01/31/2008   Last attempt to quit: 01/30/2018   Passive exposure: Never  Smokeless Tobacco Never    BH Tobacco Counseling     Are you interested in Tobacco Cessation Medications?  No, patient refused Counseled patient on smoking cessation:  Refused/Declined practical counseling Reason Tobacco Screening Not Completed: No value filed.       Social History:  Social History   Substance and Sexual Activity  Alcohol Use Yes    Alcohol/week: 6.0 - 24.0 standard drinks of alcohol   Types: 6 - 24 Cans of beer per week   Comment: 12 ppd     Social History   Substance and Sexual Activity  Drug Use Yes   Types: "Crack" cocaine, Marijuana, Cocaine    Additional Social History: Marital status: Long term relationship Long term relationship, how long?: 2 plus years What types of issues is patient dealing with in the relationship?: "current situations due to my choices " Are you sexually active?: Yes Does patient have children?: Yes How many children?: 2 How is patient's relationship with their children?: "good with my oldest one and my oldest girl i need to work on"                         Allergies:  No Known Allergies Lab Results:  No results found for this or any previous visit (from the past 48 hour(s)).   Blood Alcohol level:  Lab Results  Component Value Date   [facility] <10 07/23/2023   ETH <10 05/31/2023    Metabolic Disorder Labs:  Lab Results  Component Value Date   HGBA1C 5.7 (H) 05/31/2023   MPG 116.89 05/31/2023   MPG 99.67 11/16/2021   No results found for: "PROLACTIN" Lab Results  Component Value Date   CHOL 185 06/02/2023   TRIG 443 (H) 06/02/2023   HDL 41 06/02/2023   CHOLHDL 4.5 06/02/2023   VLDL UNABLE TO CALCULATE IF TRIGLYCERIDE OVER 400 mg/dL 19/14/7829   LDLCALC UNABLE TO CALCULATE IF TRIGLYCERIDE OVER 400 mg/dL 56/21/3086   LDLCALC 89 11/16/2021    Current Medications: Current Facility-Administered Medications  Medication Dose Route Frequency Provider Last Rate Last Admin   acetaminophen (TYLENOL) tablet 650 mg  650 mg Oral Q6H PRN Motley-Mangrum, Jadeka A, PMHNP       alum & mag hydroxide-simeth (MAALOX/MYLANTA) 200-200-20 MG/5ML suspension 30 mL  30 mL Oral Q4H PRN Motley-Mangrum, Jadeka A, PMHNP       cloNIDine (CATAPRES) tablet 0.1 mg  0.1 mg Oral QID PRN Rex Kras, MD       hydrOXYzine (ATARAX) tablet 25 mg  25 mg Oral TID PRN Motley-Mangrum, Jadeka A,  PMHNP   25 mg at 07/25/23 2037   magnesium hydroxide (MILK OF MAGNESIA) suspension 30 mL  30 mL Oral Daily PRN Motley-Mangrum, Jadeka A, PMHNP       OLANZapine (ZYPREXA) injection 5 mg  5 mg Intramuscular TID PRN Motley-Mangrum, Jadeka A, PMHNP       sertraline (ZOLOFT) tablet 50 mg  50 mg Oral Daily Rex Kras, MD   50 mg at 07/25/23 0840   traZODone (DESYREL) tablet 50 mg  50 mg Oral QHS PRN Motley-Mangrum, Jadeka A, PMHNP   50 mg at 07/24/23 2115   PTA Medications: Medications Prior to Admission  Medication Sig Dispense Refill Last Dose   hydrOXYzine (ATARAX) 25 MG tablet Take 1 tablet (25 mg total) by mouth 3 (three) times daily as needed for anxiety. (Patient not taking: Reported on 07/24/2023) 90 tablet 3 Not Taking   pantoprazole (PROTONIX) 40 MG tablet Take 1 tablet (40 mg total) by mouth daily. (Patient not taking: Reported on 07/24/2023) 30 tablet 3 Not Taking   QUEtiapine (SEROQUEL) 50 MG tablet Take 1 tablet (50 mg total) by mouth at bedtime. (Patient not taking: Reported on 07/24/2023) 30 tablet 3 Not Taking   sertraline (ZOLOFT) 100 MG tablet Take 1 tablet (100 mg total) by mouth daily. (Patient not taking: Reported on 07/24/2023) 30 tablet 3 Not Taking   traZODone (DESYREL) 50 MG tablet Take 1 tablet (50 mg total) by mouth at bedtime as needed for sleep. (Patient not taking: Reported on 07/24/2023) 30 tablet 3 Not Taking    Musculoskeletal: Strength & Muscle Tone: within normal limits Gait & Station: normal Patient leans: N/A            Psychiatric Specialty Exam:  Presentation  General Appearance:  Disheveled  Eye Contact: Fair  Speech: Clear and Coherent  Speech Volume: Decreased  Handedness: Right   Mood and Affect  Mood: Anxious  Affect: Blunt; Restricted   Thought Process  Thought Processes: Coherent  Duration of Psychotic Symptoms:N/A Past Diagnosis of Schizophrenia or Psychoactive disorder: No  Descriptions of  Associations:Intact  Orientation:Full (Time, Place and Person)  Thought Content:Perseveration; Rumination  Hallucinations:Hallucinations: None  Ideas of Reference:None  Suicidal Thoughts:Suicidal Thoughts: No  Homicidal Thoughts:Homicidal Thoughts: No   Sensorium  Memory: Immediate Fair; Recent Fair; Remote Fair  Judgment: Fair  Insight: Fair   Art therapist  Concentration: Fair  Attention Span: Fair  Recall: Fair  Fund of Knowledge: Fair  Language: Fair   Psychomotor Activity  Psychomotor Activity: Psychomotor Activity: Normal   Assets  Assets: Manufacturing systems engineer; Desire for Improvement   Sleep  Sleep: Sleep: Fair Number of Hours of Sleep: 7.75    Physical Exam: Physical Exam Constitutional:      Appearance: Normal appearance. He is normal weight.  Neurological:     General: No focal deficit present.     Mental Status: He is alert and oriented to person, place, and time. Mental status is at baseline.  Psychiatric:        Mood and Affect: Mood normal.        Behavior: Behavior normal.        Thought Content: Thought content normal.    Review of Systems  Psychiatric/Behavioral:  Positive for depression. Negative for substance abuse. The patient is not nervous/anxious.   All other systems reviewed and are negative.  Blood pressure (!) 159/102, pulse 66, temperature 98.6 F (37 C), temperature source Oral, resp. rate (!) 8, height 5\' 9"  (1.753 m), weight 107.5 kg, SpO2 100%. Body mass index is 35 kg/m.  Treatment Plan Summary: ASSESSMENT:  Diagnoses / Active Problems: Major depression recurrent with suicidal ideations Substance use disorder  PLAN: Safety and Monitoring:  --  Voluntary admission to inpatient psychiatric unit for safety, stabilization and treatment  -- Daily contact with patient to assess and evaluate symptoms and progress in treatment  -- Patient's case to be discussed in multi-disciplinary team  meeting  -- Observation Level : q15 minute checks  -- Vital signs:  q12 hours  -- Precautions: suicide, elopement, and assault  2. Psychiatric Diagnoses and Treatment:    --  The risks/benefits/side-effects/alternatives to this medication were discussed in detail with the patient and time was given for questions. The patient consents to medication trial.  -- Patient refused to take any sedating medications but is willing to restart Zoloft at 50 mg a day  -- Metabolic profile and EKG monitoring obtained while on an atypical antipsychotic (BMI: Lipid Panel: HbgA1c: QTc:) as indicated  -- Encouraged patient to participate in unit milieu and in scheduled group therapies   -- Short Term Goals: Ability to identify changes in lifestyle to reduce recurrence of condition will improve, Ability to verbalize feelings will improve, Ability to disclose and discuss suicidal ideas, Ability to demonstrate self-control will improve, and Ability to identify triggers associated with substance abuse/mental health issues will improve  -- Long Term Goals: Improvement in symptoms so as ready for discharge    3. Medical Issues Being Addressed:   Tobacco Use Disorder  -- Nicotine patch 21mg /24 hours ordered  -- Smoking cessation encouraged  4. Discharge Planning:   -- Social work and case management to assist with discharge planning and identification of hospital follow-up needs prior to discharge  -- Estimated LOS: 5-7 days  -- Discharge Concerns: Need to establish a safety plan; Medication compliance and effectiveness  -- Discharge Goals: Return home with outpatient referrals for mental health follow-up including medication management/psychotherapy   Observation Level/Precautions:  15 minute checks  Laboratory:  CBC Chemistry Profile HbAIC  Psychotherapy:    Medications: Zoloft 50 mg a day. Clonidine 0.1 mg every 6 hours as needed for hypertension.  Consultations: Possible medical consult if he is blood  pressure he becomes symptomatic.  Discharge Concerns: Safety plan.  [facility]erral [facility].  Estimated LOS: 5 to 7 days.  Other:       I certify that inpatient services furnished  can reasonably be expected to improve the patient's condition.    Rex Kras, MD 11/24/202410:52 AM Patient ID: Brian Nolan, male   DOB: Oct 04, 1980, 42 y.o.   MRN: [id number] Patient ID: Brian Nolan, male   DOB: 1981/05/25, 42 y.o.   MRN: [id number]

## 2023-07-26 NOTE — Progress Notes (Signed)
   07/26/23 2000  Psych Admission Type (Psych Patients Only)  Admission Status Voluntary  Psychosocial Assessment  Patient Complaints Anxiety;Depression  Eye Contact Fair  Facial Expression Anxious  Affect Anxious  Speech Logical/coherent  Interaction Assertive  Motor Activity Restless  Appearance/Hygiene Unremarkable  Behavior Characteristics Appropriate to situation  Mood Anxious;Depressed  Thought Process  Coherency WDL  Content WDL  Delusions None reported or observed  Perception WDL  Hallucination None reported or observed  Judgment Limited  Confusion None  Danger to Self  Current suicidal ideation? Denies  Self-Injurious Behavior No self-injurious ideation or behavior indicators observed or expressed   Agreement Not to Harm Self Yes  Description of Agreement Verbal  Danger to Others  Danger to Others None reported or observed

## 2023-07-26 NOTE — Plan of Care (Signed)
  Problem: Coping: Goal: Ability to verbalize frustrations and anger appropriately will improve Outcome: Progressing   Problem: Activity: Goal: Interest or engagement in activities will improve Outcome: Progressing   Problem: Coping: Goal: Ability to verbalize frustrations and anger appropriately will improve Outcome: Progressing

## 2023-07-27 DIAGNOSIS — F332 Major depressive disorder, recurrent severe without psychotic features: Secondary | ICD-10-CM | POA: Diagnosis not present

## 2023-07-27 MED ORDER — PANTOPRAZOLE SODIUM 40 MG PO TBEC
40.0000 mg | DELAYED_RELEASE_TABLET | Freq: Every day | ORAL | Status: DC
  Administered 2023-07-27 – 2023-08-03 (×8): 40 mg via ORAL
  Filled 2023-07-27 (×10): qty 1

## 2023-07-27 NOTE — [facility] Group Notes (Signed)
Spiritual care group on grief and loss facilitated by Chaplain Dyanne Carrel, [facility]  Group Goal: Support / Education around grief and loss  Members engage in facilitated group support and psycho-social education.  Group Description:  Following introductions and group rules, group members engaged in facilitated group dialogue and support around topic of loss, with particular support around experiences of loss in their lives. Group Identified types of loss (relationships / self / things) and identified patterns, circumstances, and changes that precipitate losses. Reflected on thoughts / feelings around loss, normalized grief responses, and recognized variety in grief experience. Group encouraged individual reflection on safe space and on the coping skills that they are already utilizing.  Group drew on Adlerian / Rogerian and narrative framework  Patient Progress: Brian Nolan attended group and actively engaged and participated in group conversation and activities. He was supportive of peers.

## 2023-07-27 NOTE — Plan of Care (Signed)
  Problem: Education: Goal: Emotional status will improve Outcome: Progressing Goal: Mental status will improve Outcome: Progressing Goal: Verbalization of understanding the information provided will improve Outcome: Progressing   Problem: Activity: Goal: Interest or engagement in activities will improve Outcome: Progressing Goal: Sleeping patterns will improve Outcome: Progressing   Problem: Coping: Goal: Ability to verbalize frustrations and anger appropriately will improve Outcome: Progressing   Problem: Health Behavior/Discharge Planning: Goal: Compliance with treatment plan for underlying cause of condition will improve Outcome: Progressing   Problem: Safety: Goal: Periods of time without injury will increase Outcome: Progressing

## 2023-07-27 NOTE — Plan of Care (Signed)
  Problem: Education: Goal: Emotional status will improve Outcome: Progressing Goal: Mental status will improve Outcome: Progressing   

## 2023-07-27 NOTE — Group Note (Signed)
Recreation Therapy Group Note   Group Topic:Health and Wellness  Group Date: 07/27/2023 Start Time: 0930 End Time: 1005 Facilitators: Niesha Bame-McCall, LRT,CTRS Location: [address] Dayroom   Group Topic: Exercise/Wellness  Goal Area(s) Addresses:  Patient will verbalize benefit of exercise during group session. Patient will identify an exercise that can be completed post d/c. Patient will acknowledge benefits of exercise when used as a coping mechanism.   Group Description: Patients and LRT discussed the importance of physical exercise and its benefits. During group, patients took turns leading the group in the exercises/stretches of their choosing. Patients completed three rounds of exercise. Patients could get water or take a break if needed.  Education: Physical Activity, Health and Wellness  Education Outcome: Acknowledges understanding/In group clarification offered/Needs additional education.    Affect/Mood: Appropriate   Participation Level: Minimal   Participation Quality: Independent   Behavior: Attentive    Speech/Thought Process: Relevant   Insight: None   Judgement: None   Modes of Intervention: Music   Patient Response to Interventions:  Attentive   Education Outcome:  In group clarification offered    Clinical Observations/Individualized Feedback: Pt came in for the last few minutes of group. Pt observed as peers completed the exercise. Pt made one suggestion for an exercise for peers to complete.    Plan: Continue to engage patient in RT group sessions 2-3x/week.   Brian Nolan, LRT,CTRS  07/27/2023 11:46 AM

## 2023-07-27 NOTE — Plan of Care (Signed)
Problem: Education: Goal: Emotional status will improve Outcome: Progressing Goal: Mental status will improve Outcome: Progressing   Problem: Activity: Goal: Interest or engagement in activities will improve Outcome: Progressing Goal: Sleeping patterns will improve Outcome: Progressing   Problem: Coping: Goal: Ability to verbalize frustrations and anger appropriately will improve Outcome: Progressing Goal: Ability to demonstrate self-control will improve Outcome: Progressing   Problem: Safety: Goal: Periods of time without injury will increase Outcome: Progressing

## 2023-07-27 NOTE — Progress Notes (Signed)
Psychiatric progress note adult  Patient Identification: Brian Nolan MRN:  [id number] Date of Evaluation:  07/27/2023 Chief Complaint:  MDD (major depressive disorder), recurrent severe, without psychosis (HCC) [F33.2] Principal Diagnosis: <principal problem not specified> Diagnosis:  Active Problems:   MDD (major depressive disorder), recurrent severe, without psychosis (HCC)   Reason for admission   42 year old male admitted through the emergency room for symptoms of depression and suicidal ideations.  This was in the context of substance use disorder.  Chart review from last 24 hours   Staff reports that the patient has improved nausea.  He denies any side effects.  His blood pressure was increased over the weekend but with  PRN clonidine, he did improved and today it was 118/88.  He is attending some groups and seems to be contracting for safety  Yesterday, the psychiatry team made the following recommendations:  Zoloft 50 mg a day. Clonidine 0.1 mg as needed for hypertension Trazodone 50 mg at night as needed for sleep Information obtained during interview   Patient was seen and evaluated today.  He continues to be very cooperative and compliant with treatment but seems somewhat concerned and anxious about her the symptoms he had before and thinks that he may be having some kind of "infection or STD" and would like to be tested.  He is contracting for safety.  His depression is improving.  The plan is to reduce STD testing.  His BMP shows a glucose of 104 calcium of 8.8.  Hemoglobin A1c is pending at this time.  We will consider additional testing as indicated.  Associated Signs/Symptoms: Depression Symptoms:  depressed mood, psychomotor agitation, difficulty concentrating, suicidal thoughts without plan, anxiety, (Hypo) Manic Symptoms:  Impulsivity, Irritable Mood, Anxiety Symptoms:  Excessive Worry, Psychotic Symptoms:   Denied any. PTSD  Symptoms: Negative Total Time spent with patient: 30 minutes  Past Psychiatric History: Previously hospitalized at [facility] behavioral health in September.  He was on Zoloft, Seroquel, trazodone.  Patient gives a history of major depression and grief reaction.  Is the patient at risk to self? Yes.    Has the patient been a risk to self in the past 6 months? Yes.    Has the patient been a risk to self within the distant past? Yes.    Is the patient a risk to others? No.  Has the patient been a risk to others in the past 6 months? No.  Has the patient been a risk to others within the distant past? No.   [place] Scale:  Flowsheet Row [facility] Most recent reading at 07/23/2023  5:15 PM ED from 07/23/2023 in [facility] Emergency Department at [facility] Most recent reading at 07/23/2023  8:46 AM Video Visit from 07/06/2023 in [facility] Most recent reading at 07/06/2023 10:18 AM  C-SSRS RISK CATEGORY Moderate Risk Moderate Risk Error: Question 6 not populated        Prior Inpatient Therapy: Yes.   If yes, describe last hospitalized in September. Prior Outpatient Therapy: No. If yes, describe noncompliant with medications and outpatient follow-up.  Alcohol Screening: 1. How often do you have a drink containing alcohol?: 2 to 4 times a month 2. How many drinks containing alcohol do you have on a typical day when you are drinking?: 5 or 6 3. How often do you have six or more drinks on one occasion?: Weekly AUDIT-C Score: 7 4. How often  during the last year have you found that you were not able to stop drinking once you had started?: Weekly 5. How often during the last year have you failed to do what was normally expected from you because of drinking?: Monthly 6. How often during the last year have you needed a first drink in the morning to get yourself going after a heavy drinking  session?: Weekly 7. How often during the last year have you had a feeling of guilt of remorse after drinking?: Weekly 8. How often during the last year have you been unable to remember what happened the night before because you had been drinking?: Never 9. Have you or someone else been injured as a result of your drinking?: No 10. Has a relative or friend or a doctor or another health worker been concerned about your drinking or suggested you cut down?: No Alcohol Use Disorder Identification Test Final Score (AUDIT): 18 Alcohol Brief Interventions/Follow-up: Alcohol education/Brief advice Substance Abuse History in the last 12 months:  Yes.   Consequences of Substance Abuse: Family Consequences:  Increase conflicts, loss of job. Patient reports emotional lability. Previous Psychotropic Medications: Yes  Psychological Evaluations: No  Past Medical History:  Past Medical History:  Diagnosis Date   Depression    History of hiatal hernia    Schizophrenia (HCC)    Substance abuse (HCC)     Past Surgical History:  Procedure Laterality Date   NO PAST SURGERIES     Family History:  Family History  Problem Relation Age of Onset   Schizophrenia Maternal Uncle    Diabetes Mellitus II Father    Family Psychiatric  History: Unknown at this time. Tobacco Screening:  Social History   Tobacco Use  Smoking Status Every Day   Current packs/day: 1.00   Average packs/day: 1 pack/day for 14.5 years (14.5 ttl pk-yrs)   Types: Cigarettes   Start date: 01/31/2008   Last attempt to quit: 01/30/2018   Passive exposure: Never  Smokeless Tobacco Never    BH Tobacco Counseling     Are you interested in Tobacco Cessation Medications?  No, patient refused Counseled patient on smoking cessation:  Refused/Declined practical counseling Reason Tobacco Screening Not Completed: No value filed.       Social History:  Social History   Substance and Sexual Activity  Alcohol Use Yes   Alcohol/week: 6.0  - 24.0 standard drinks of alcohol   Types: 6 - 24 Cans of beer per week   Comment: 12 ppd     Social History   Substance and Sexual Activity  Drug Use Yes   Types: "Crack" cocaine, Marijuana, Cocaine    Additional Social History: Marital status: Long term relationship Long term relationship, how long?: 2 plus years What types of issues is patient dealing with in the relationship?: "current situations due to my choices " Are you sexually active?: Yes Does patient have children?: Yes How many children?: 2 How is patient's relationship with their children?: "good with my oldest one and my oldest girl i need to work on"                         Allergies:  No Known Allergies Lab Results:  No results found for this or any previous visit (from the past 48 hour(s)).   Blood Alcohol level:  Lab Results  Component Value Date   [facility] An <10 07/23/2023   ETH <10 05/31/2023    Metabolic Disorder Labs:  Lab Results  Component Value Date   HGBA1C 5.7 (H) 05/31/2023   MPG 116.89 05/31/2023   MPG 99.67 11/16/2021   No results found for: "PROLACTIN" Lab Results  Component Value Date   CHOL 185 06/02/2023   TRIG 443 (H) 06/02/2023   HDL 41 06/02/2023   CHOLHDL 4.5 06/02/2023   VLDL UNABLE TO CALCULATE IF TRIGLYCERIDE OVER 400 mg/dL 45/40/9811   LDLCALC UNABLE TO CALCULATE IF TRIGLYCERIDE OVER 400 mg/dL 91/47/8295   LDLCALC 89 11/16/2021    Current Medications: Current Facility-Administered Medications  Medication Dose Route Frequency Provider Last Rate Last Admin   acetaminophen (TYLENOL) tablet 650 mg  650 mg Oral Q6H PRN Motley-Mangrum, Jadeka A, PMHNP       alum & mag hydroxide-simeth (MAALOX/MYLANTA) 200-200-20 MG/5ML suspension 30 mL  30 mL Oral Q4H PRN Motley-Mangrum, Jadeka A, PMHNP   30 mL at 07/26/23 1848   cloNIDine (CATAPRES) tablet 0.1 mg  0.1 mg Oral QID PRN Rex Kras, MD   0.1 mg at 07/26/23 1122   hydrOXYzine (ATARAX) tablet 25 mg  25 mg Oral TID PRN  Motley-Mangrum, Jadeka A, PMHNP   25 mg at 07/26/23 2130   magnesium hydroxide (MILK OF MAGNESIA) suspension 30 mL  30 mL Oral Daily PRN Motley-Mangrum, Jadeka A, PMHNP       OLANZapine (ZYPREXA) injection 5 mg  5 mg Intramuscular TID PRN Motley-Mangrum, Jadeka A, PMHNP       sertraline (ZOLOFT) tablet 50 mg  50 mg Oral Daily Rex Kras, MD   50 mg at 07/27/23 0802   traZODone (DESYREL) tablet 50 mg  50 mg Oral QHS PRN Motley-Mangrum, Jadeka A, PMHNP   50 mg at 07/26/23 2130   PTA Medications: Medications Prior to Admission  Medication Sig Dispense Refill Last Dose   hydrOXYzine (ATARAX) 25 MG tablet Take 1 tablet (25 mg total) by mouth 3 (three) times daily as needed for anxiety. (Patient not taking: Reported on 07/24/2023) 90 tablet 3 Not Taking   pantoprazole (PROTONIX) 40 MG tablet Take 1 tablet (40 mg total) by mouth daily. (Patient not taking: Reported on 07/24/2023) 30 tablet 3 Not Taking   QUEtiapine (SEROQUEL) 50 MG tablet Take 1 tablet (50 mg total) by mouth at bedtime. (Patient not taking: Reported on 07/24/2023) 30 tablet 3 Not Taking   sertraline (ZOLOFT) 100 MG tablet Take 1 tablet (100 mg total) by mouth daily. (Patient not taking: Reported on 07/24/2023) 30 tablet 3 Not Taking   traZODone (DESYREL) 50 MG tablet Take 1 tablet (50 mg total) by mouth at bedtime as needed for sleep. (Patient not taking: Reported on 07/24/2023) 30 tablet 3 Not Taking    Musculoskeletal: Strength & Muscle Tone: within normal limits Gait & Station: normal Patient leans: N/A            Psychiatric Specialty Exam:  Presentation  General Appearance:  Casual  Eye Contact: Fair  Speech: Normal Rate  Speech Volume: Normal  Handedness: Right   Mood and Affect  Mood: Depressed  Affect: Restricted   Thought Process  Thought Processes: Coherent  Duration of Psychotic Symptoms:N/A Past Diagnosis of Schizophrenia or Psychoactive disorder: No  Descriptions of  Associations:Intact  Orientation:Full (Time, Place and Person)  Thought Content:Logical  Hallucinations:Hallucinations: None  Ideas of Reference:None  Suicidal Thoughts:Suicidal Thoughts: No  Homicidal Thoughts:Homicidal Thoughts: No   Sensorium  Memory: Immediate Fair; Remote Fair; Recent Fair  Judgment: Fair  Insight: Fair   Art therapist  Concentration: Fair  Attention Span: Fair  Recall: Jennelle Human of Knowledge: Fair  Language: Fair   Psychomotor Activity  Psychomotor Activity: Psychomotor Activity: Normal   Assets  Assets: Communication Skills; Desire for Improvement   Sleep  Sleep: Sleep: Good Number of Hours of Sleep: 7.5    Physical Exam: Physical Exam Constitutional:      Appearance: Normal appearance. He is normal weight.  Neurological:     General: No focal deficit present.     Mental Status: He is alert and oriented to person, place, and time. Mental status is at baseline.  Psychiatric:        Mood and Affect: Mood normal.        Behavior: Behavior normal.        Thought Content: Thought content normal.    Review of Systems  Psychiatric/Behavioral:  Positive for depression. Negative for substance abuse. The patient is not nervous/anxious.   All other systems reviewed and are negative.  Blood pressure 116/88, pulse (!) 106, temperature 98.9 F (37.2 C), temperature source Oral, resp. rate 20, height 5\' 9"  (1.753 m), weight 107.5 kg, SpO2 100%. Body mass index is 35 kg/m.  Treatment Plan Summary: ASSESSMENT:  Diagnoses / Active Problems: Major depression recurrent with suicidal ideations Substance use disorder  PLAN: Safety and Monitoring:  --  Voluntary admission to inpatient psychiatric unit for safety, stabilization and treatment  -- Daily contact with patient to assess and evaluate symptoms and progress in treatment  -- Patient's case to be discussed in multi-disciplinary team meeting  -- Observation  Level : q15 minute checks  -- Vital signs:  q12 hours  -- Precautions: suicide, elopement, and assault  2. Psychiatric Diagnoses and Treatment:    --  The risks/benefits/side-effects/alternatives to this medication were discussed in detail with the patient and time was given for questions. The patient consents to medication trial.  -- Patient refused to take any sedating medications but is willing to restart Zoloft at 50 mg a day  -- Metabolic profile and EKG monitoring obtained while on an atypical antipsychotic (BMI: Lipid Panel: HbgA1c: QTc:) as indicated  -- Encouraged patient to participate in unit milieu and in scheduled group therapies   -- Short Term Goals: Ability to identify changes in lifestyle to reduce recurrence of condition will improve, Ability to verbalize feelings will improve, Ability to disclose and discuss suicidal ideas, Ability to demonstrate self-control will improve, and Ability to identify triggers associated with substance abuse/mental health issues will improve  -- Long Term Goals: Improvement in symptoms so as ready for discharge    3. Medical Issues Being Addressed:   Tobacco Use Disorder  -- Nicotine patch 21mg /24 hours ordered  -- Smoking cessation encouraged  4. Discharge Planning:   -- Social work and case management to assist with discharge planning and identification of hospital follow-up needs prior to discharge  -- Estimated LOS: 5-7 days  -- Discharge Concerns: Need to establish a safety plan; Medication compliance and effectiveness  -- Discharge Goals: Return home with outpatient referrals for mental health follow-up including medication management/psychotherapy   Observation Level/Precautions:  15 minute checks  Laboratory:  CBC Chemistry Profile HbAIC  Psychotherapy:    Medications: Zoloft 50 mg a day. Clonidine 0.1 mg every 6 hours as needed for hypertension.  Consultations: Possible medical consult if he is blood pressure he becomes  symptomatic.  Discharge Concerns: Safety plan.  [facility]erral [facility].  Estimated LOS: Possible discharge by Thursday.  Other:       I certify that inpatient  services furnished can reasonably be expected to improve the patient's condition.    Rex Kras, MD 11/25/20249:40 AM Patient ID: Brian Nolan, male   DOB: 1980-12-06, 42 y.o.   MRN: [id number] Patient ID: Brian Nolan, male   DOB: December 26, 1980, 42 y.o.   MRN: [id number] Patient ID: Brian Nolan, male   DOB: August 08, 1981, 42 y.o.   MRN: [id number]

## 2023-07-27 NOTE — Progress Notes (Signed)
   07/27/23 0800  Psych Admission Type (Psych Patients Only)  Admission Status Voluntary  Psychosocial Assessment  Patient Complaints Anxiety  Eye Contact Fair  Facial Expression Anxious  Affect Anxious  Speech Logical/coherent  Interaction Assertive  Motor Activity Restless  Appearance/Hygiene Unremarkable  Behavior Characteristics Appropriate to situation  Mood Anxious  Thought Process  Coherency WDL  Content WDL  Delusions None reported or observed  Perception WDL  Hallucination None reported or observed  Judgment Limited  Confusion None  Danger to Self  Current suicidal ideation? Denies  Self-Injurious Behavior No self-injurious ideation or behavior indicators observed or expressed   Agreement Not to Harm Self Yes  Description of Agreement verbal  Danger to Others  Danger to Others None reported or observed  Danger to Others Abnormal  Harmful Behavior to others No threats or harm toward other people  Destructive Behavior No threats or harm toward property   Patient stated that being here is beneficial to his mental health.

## 2023-07-27 NOTE — [facility] Group Notes (Signed)
[facility] Group Notes:  (Nursing/MHT/Case Management/Adjunct)  Date:  07/27/2023  Time:  2000  Type of Therapy:   AA Group  Participation Level:  Active  Participation Quality:  Appropriate, Attentive, Sharing, and Supportive  Affect:  Appropriate  Cognitive:  Alert and Appropriate  Insight:  Appropriate  Engagement in Group:  Engaged  Modes of Intervention:  Discussion and Support  Summary of Progress/Problems:  Brian Nolan 07/27/2023, 9:28 PM

## 2023-07-27 NOTE — Group Note (Signed)
Occupational Therapy Group Note  Group Topic: Sleep Hygiene  Group Date: 07/27/2023 Start Time: 1430 End Time: 1500 Facilitators: Ted Mcalpine, OT   Group Description: Group encouraged increased participation and engagement through topic focused on sleep hygiene. Patients reflected on the quality of sleep they typically receive and identified areas that need improvement. Group was given background information on sleep and sleep hygiene, including common sleep disorders. Group members also received information on how to improve one's sleep and introduced a sleep diary as a tool that can be utilized to track sleep quality over a length of time. Group session ended with patients identifying one or more strategies they could utilize or implement into their sleep routine in order to improve overall sleep quality.        Therapeutic Goal(s):  Identify one or more strategies to improve overall sleep hygiene  Identify one or more areas of sleep that are negatively impacted (sleep too much, too little, etc)     Participation Level: Engaged   Participation Quality: Independent   Behavior: Appropriate   Speech/Thought Process: Relevant   Affect/Mood: Appropriate   Insight: Fair   Judgement: Fair      Modes of Intervention: Education  Patient Response to Interventions:  Attentive   Plan: Continue to engage patient in OT groups 2 - 3x/week.  07/27/2023  Ted Mcalpine, OT   Brian Nolan, OT

## 2023-07-28 DIAGNOSIS — F332 Major depressive disorder, recurrent severe without psychotic features: Secondary | ICD-10-CM | POA: Diagnosis not present

## 2023-07-28 LAB — GC/CHLAMYDIA PROBE AMP (~~LOC~~) NOT AT ARMC
Chlamydia: NEGATIVE
Comment: NEGATIVE
Comment: NORMAL
Neisseria Gonorrhea: NEGATIVE

## 2023-07-28 NOTE — [facility] Group Notes (Signed)
Adult Psychoeducational Group Note  Date:  07/28/2023 Time:  9:14 PM  Group Topic/Focus:  Wrap-Up Group:   The focus of this group is to help patients review their daily goal of treatment and discuss progress on daily workbooks.  Participation Level:  Active  Participation Quality:  Appropriate  Affect:  Appropriate  Cognitive:  Appropriate  Insight: Appropriate  Engagement in Group:  Engaged  Modes of Intervention:  Discussion  Additional Comments:  Pt stated day was 6,goal for today was to call some jobs and it was accomplished.  Joselyn Arrow 07/28/2023, 9:14 PM

## 2023-07-28 NOTE — Group Note (Signed)
[facility] LCSW Group Therapy Note   Group Date: 07/28/2023 Start Time: 1100 End Time: 1200  Type of Therapy:  Group Therapy  Participation Level:  Patient did not attend group on today. Patients have been encouraged to participate in all programming on milieu.   Loleta Dicker, LCSW

## 2023-07-28 NOTE — Group Note (Signed)
Date:  07/28/2023 Time:  9:48 AM  Group Topic/Focus:  Developing a Wellness Toolbox:   The focus of this group is to help patients develop a "wellness toolbox" with skills and strategies to promote recovery upon discharge.    Participation Level:  Active  Participation Quality:  Appropriate  Affect:  Appropriate  Cognitive:  Alert  Insight: Good  Engagement in Group:  Engaged  Modes of Intervention:  Education  Additional Comments:    Beckie Busing 07/28/2023, 9:48 AM

## 2023-07-28 NOTE — Progress Notes (Signed)
   07/27/23 2100  Psych Admission Type (Psych Patients Only)  Admission Status Voluntary  Psychosocial Assessment  Patient Complaints Anxiety  Eye Contact Fair  Facial Expression Anxious  Affect Anxious  Speech Logical/coherent  Interaction Assertive  Motor Activity Restless  Appearance/Hygiene Unremarkable  Behavior Characteristics Appropriate to situation  Mood Anxious  Thought Process  Coherency WDL  Content WDL  Delusions None reported or observed  Perception WDL  Hallucination None reported or observed  Judgment Limited  Confusion None  Danger to Self  Current suicidal ideation? Denies  Self-Injurious Behavior No self-injurious ideation or behavior indicators observed or expressed   Agreement Not to Harm Self Yes  Description of Agreement Verbal  Danger to Others  Danger to Others None reported or observed  Danger to Others Abnormal  Harmful Behavior to others No threats or harm toward other people  Destructive Behavior No threats or harm toward property

## 2023-07-28 NOTE — Group Note (Signed)
Date:  07/28/2023 Time:  9:44 AM  Group Topic/Focus:  Goals Group:   The focus of this group is to help patients establish daily goals to achieve during treatment and discuss how the patient can incorporate goal setting into their daily lives to aide in recovery.    Participation Level:  Active  Participation Quality:  Appropriate  Affect:  Appropriate  Cognitive:  Alert  Insight: Good  Engagement in Group:  Engaged  Modes of Intervention:  Discussion  Additional Comments:    Beckie Busing 07/28/2023, 9:44 AM

## 2023-07-28 NOTE — Progress Notes (Signed)
   07/28/23 1100  Psych Admission Type (Psych Patients Only)  Admission Status Voluntary  Psychosocial Assessment  Patient Complaints None  Eye Contact Fair  Facial Expression Anxious  Affect Anxious  Speech Logical/coherent  Interaction Assertive  Motor Activity Restless  Appearance/Hygiene Unremarkable  Behavior Characteristics Appropriate to situation  Mood Anxious;Pleasant  Thought Process  Coherency WDL  Delusions None reported or observed  Perception WDL  Hallucination None reported or observed  Judgment Limited  Confusion None  Danger to Self  Current suicidal ideation? Denies  Self-Injurious Behavior No self-injurious ideation or behavior indicators observed or expressed   Agreement Not to Harm Self Yes  Description of Agreement verbal contract

## 2023-07-28 NOTE — Group Note (Signed)
Recreation Therapy Group Note   Group Topic:Animal Assisted Therapy   Group Date: 07/28/2023 Start Time: 0950 End Time: 1030 Facilitators: Tanish Prien-McCall, LRT,CTRS Location: [address] Dayroom   Animal-Assisted Activity (AAA) Program Checklist/Progress Notes Patient Eligibility Criteria Checklist & Daily Group note for Rec Tx Intervention  AAA/T Program Assumption of Risk Form signed by Patient/ or Parent Legal Guardian Yes  Patient is free of allergies or severe asthma Yes  Patient reports no fear of animals Yes  Patient reports no history of cruelty to animals Yes  Patient understands his/her participation is voluntary Yes  Patient washes hands before animal contact Yes  Patient washes hands after animal contact Yes  Education: Hand Washing, Appropriate Animal Interaction   Education Outcome: Acknowledges education.    Affect/Mood: Appropriate   Participation Level: Minimal   Participation Quality: Independent   Behavior: On-looking   Speech/Thought Process: Relevant   Insight: Moderate   Judgement: Moderate   Modes of Intervention: Teaching laboratory technician   Patient Response to Interventions:  Attentive   Education Outcome:  In group clarification offered    Clinical Observations/Individualized Feedback: Pt had minimal interaction with therapy dog team. Pt was mostly observant while in group session.    Plan: Continue to engage patient in RT group sessions 2-3x/week.   Leelynd Maldonado-McCall, LRT,CTRS 07/28/2023 12:23 PM

## 2023-07-28 NOTE — Progress Notes (Signed)
Psychiatric progress note adult  Patient Identification: Brian Nolan MRN:  [id number] Date of Evaluation:  07/28/2023 Chief Complaint:  MDD (major depressive disorder), recurrent severe, without psychosis (HCC) [F33.2] Principal Diagnosis: <principal problem not specified> Diagnosis:  Active Problems:   MDD (major depressive disorder), recurrent severe, without psychosis (HCC)   Reason for admission   42 year old male admitted through the emergency room for symptoms of depression and suicidal ideations.  This was in the context of substance use disorder.  Chart review from last 24 hours   Staff reports that the patient has been cooperative and compliant.  He is taking medications as prescribed.  Denies side effects.  He received hydroxyzine and trazodone as as needed medication.  He is attending groups.  He slept 7.75 hours.  Yesterday, the psychiatry team made the following recommendations:  Zoloft 50 mg a day. Clonidine 0.1 mg as needed for hypertension Trazodone 50 mg at night as needed for sleep Information obtained during interview   Patient continues to be cooperative and compliant.  He reports that he is feeling better.  He talked to his daughter yesterday and that made him feel better.  Apparently she is pursuing a Masters in Print production planner.  He clearly wants to go to rehab.  [facility]  His mental status is stable and he slept 7.75 hours.  No headaches or nausea noted.  His blood pressure still tends to run slightly high and today is 144/93 and he is receiving as needed clonidine.  He is not aware of being hypertensive.  He may need to see a PCP. Patient had a STD panel which is pending at this time.  Associated Signs/Symptoms: Depression Symptoms:  depressed mood, psychomotor agitation, difficulty concentrating, suicidal thoughts without plan, anxiety, (Hypo) Manic Symptoms:  Impulsivity, Irritable Mood, Anxiety Symptoms:   Excessive Worry, Psychotic Symptoms:   Denied any. PTSD Symptoms: Negative Total Time spent with patient: 30 minutes  Past Psychiatric History: Previously hospitalized at [facility] behavioral health in September.  He was on Zoloft, Seroquel, trazodone.  Patient gives a history of major depression and grief reaction.  Is the patient at risk to self? Yes.    Has the patient been a risk to self in the past 6 months? Yes.    Has the patient been a risk to self within the distant past? Yes.    Is the patient a risk to others? No.  Has the patient been a risk to others in the past 6 months? No.  Has the patient been a risk to others within the distant past? No.   [place] Scale:  Flowsheet Row [facility] Most recent reading at 07/23/2023  5:15 PM ED from 07/23/2023 in [facility] Emergency Department at [facility] Most recent reading at 07/23/2023  8:46 AM Video Visit from 07/06/2023 in [facility] Most recent reading at 07/06/2023 10:18 AM  C-SSRS RISK CATEGORY Moderate Risk Moderate Risk Error: Question 6 not populated        Prior Inpatient Therapy: Yes.   If yes, describe last hospitalized in September. Prior Outpatient Therapy: No. If yes, describe noncompliant with medications and outpatient follow-up.  Alcohol Screening: 1. How often do you have a drink containing alcohol?: 2 to 4 times a month 2. How many drinks containing alcohol do you have on a typical day when you are drinking?: 5 or 6 3. How often  do you have six or more drinks on one occasion?: Weekly AUDIT-C Score: 7 4. How often during the last year have you found that you were not able to stop drinking once you had started?: Weekly 5. How often during the last year have you failed to do what was normally expected from you because of drinking?: Monthly 6. How often during the last year have you needed a first drink in  the morning to get yourself going after a heavy drinking session?: Weekly 7. How often during the last year have you had a feeling of guilt of remorse after drinking?: Weekly 8. How often during the last year have you been unable to remember what happened the night before because you had been drinking?: Never 9. Have you or someone else been injured as a result of your drinking?: No 10. Has a relative or friend or a doctor or another health worker been concerned about your drinking or suggested you cut down?: No Alcohol Use Disorder Identification Test Final Score (AUDIT): 18 Alcohol Brief Interventions/Follow-up: Alcohol education/Brief advice Substance Abuse History in the last 12 months:  Yes.   Consequences of Substance Abuse: Family Consequences:  Increase conflicts, loss of job. Patient reports emotional lability. Previous Psychotropic Medications: Yes  Psychological Evaluations: No  Past Medical History:  Past Medical History:  Diagnosis Date   Depression    History of hiatal hernia    Schizophrenia (HCC)    Substance abuse (HCC)     Past Surgical History:  Procedure Laterality Date   NO PAST SURGERIES     Family History:  Family History  Problem Relation Age of Onset   Schizophrenia Maternal Uncle    Diabetes Mellitus II Father    Family Psychiatric  History: Unknown at this time. Tobacco Screening:  Social History   Tobacco Use  Smoking Status Every Day   Current packs/day: 1.00   Average packs/day: 1 pack/day for 14.5 years (14.5 ttl pk-yrs)   Types: Cigarettes   Start date: 01/31/2008   Last attempt to quit: 01/30/2018   Passive exposure: Never  Smokeless Tobacco Never    BH Tobacco Counseling     Are you interested in Tobacco Cessation Medications?  No, patient refused Counseled patient on smoking cessation:  Refused/Declined practical counseling Reason Tobacco Screening Not Completed: No value filed.       Social History:  Social History   Substance  and Sexual Activity  Alcohol Use Yes   Alcohol/week: 6.0 - 24.0 standard drinks of alcohol   Types: 6 - 24 Cans of beer per week   Comment: 12 ppd     Social History   Substance and Sexual Activity  Drug Use Yes   Types: "Crack" cocaine, Marijuana, Cocaine    Additional Social History: Marital status: Long term relationship Long term relationship, how long?: 2 plus years What types of issues is patient dealing with in the relationship?: "current situations due to my choices " Are you sexually active?: Yes Does patient have children?: Yes How many children?: 2 How is patient's relationship with their children?: "good with my oldest one and my oldest girl i need to work on"                         Allergies:  No Known Allergies Lab Results:  No results found for this or any previous visit (from the past 48 hour(s)).   Blood Alcohol level:  Lab Results  Component Value Date  ETH <10 07/23/2023   ETH <10 05/31/2023    Metabolic Disorder Labs:  Lab Results  Component Value Date   HGBA1C 5.7 (H) 05/31/2023   MPG 116.89 05/31/2023   MPG 99.67 11/16/2021   No results found for: "PROLACTIN" Lab Results  Component Value Date   CHOL 185 06/02/2023   TRIG 443 (H) 06/02/2023   HDL 41 06/02/2023   CHOLHDL 4.5 06/02/2023   VLDL UNABLE TO CALCULATE IF TRIGLYCERIDE OVER 400 mg/dL 28/41/3244   LDLCALC UNABLE TO CALCULATE IF TRIGLYCERIDE OVER 400 mg/dL 09/03/7251   LDLCALC 89 11/16/2021    Current Medications: Current Facility-Administered Medications  Medication Dose Route Frequency Provider Last Rate Last Admin   acetaminophen (TYLENOL) tablet 650 mg  650 mg Oral Q6H PRN Motley-Mangrum, Jadeka A, PMHNP       alum & mag hydroxide-simeth (MAALOX/MYLANTA) 200-200-20 MG/5ML suspension 30 mL  30 mL Oral Q4H PRN Motley-Mangrum, Jadeka A, PMHNP   30 mL at 07/27/23 2015   cloNIDine (CATAPRES) tablet 0.1 mg  0.1 mg Oral QID PRN Rex Kras, MD   0.1 mg at 07/26/23 1122    hydrOXYzine (ATARAX) tablet 25 mg  25 mg Oral TID PRN Motley-Mangrum, Jadeka A, PMHNP   25 mg at 07/27/23 2108   magnesium hydroxide (MILK OF MAGNESIA) suspension 30 mL  30 mL Oral Daily PRN Motley-Mangrum, Jadeka A, PMHNP       OLANZapine (ZYPREXA) injection 5 mg  5 mg Intramuscular TID PRN Motley-Mangrum, Jadeka A, PMHNP       pantoprazole (PROTONIX) EC tablet 40 mg  40 mg Oral Daily Massengill, Nathan, MD   40 mg at 07/28/23 0829   sertraline (ZOLOFT) tablet 50 mg  50 mg Oral Daily Rex Kras, MD   50 mg at 07/28/23 0829   traZODone (DESYREL) tablet 50 mg  50 mg Oral QHS PRN Motley-Mangrum, Jadeka A, PMHNP   50 mg at 07/27/23 2108   PTA Medications: Medications Prior to Admission  Medication Sig Dispense Refill Last Dose   hydrOXYzine (ATARAX) 25 MG tablet Take 1 tablet (25 mg total) by mouth 3 (three) times daily as needed for anxiety. (Patient not taking: Reported on 07/24/2023) 90 tablet 3 Not Taking   pantoprazole (PROTONIX) 40 MG tablet Take 1 tablet (40 mg total) by mouth daily. (Patient not taking: Reported on 07/24/2023) 30 tablet 3 Not Taking   QUEtiapine (SEROQUEL) 50 MG tablet Take 1 tablet (50 mg total) by mouth at bedtime. (Patient not taking: Reported on 07/24/2023) 30 tablet 3 Not Taking   sertraline (ZOLOFT) 100 MG tablet Take 1 tablet (100 mg total) by mouth daily. (Patient not taking: Reported on 07/24/2023) 30 tablet 3 Not Taking   traZODone (DESYREL) 50 MG tablet Take 1 tablet (50 mg total) by mouth at bedtime as needed for sleep. (Patient not taking: Reported on 07/24/2023) 30 tablet 3 Not Taking    Musculoskeletal: Strength & Muscle Tone: within normal limits Gait & Station: normal Patient leans: N/A            Psychiatric Specialty Exam:  Presentation  General Appearance:  Casual  Eye Contact: Fair  Speech: Clear and Coherent  Speech Volume: Normal  Handedness: Right   Mood and Affect  Mood: Anxious;  Depressed  Affect: Restricted   Thought Process  Thought Processes: Coherent  Duration of Psychotic Symptoms:N/A Past Diagnosis of Schizophrenia or Psychoactive disorder: No  Descriptions of Associations:Intact  Orientation:Full (Time, Place and Person)  Thought Content:Logical  Hallucinations:Hallucinations: None  Ideas  of Reference:None  Suicidal Thoughts:Suicidal Thoughts: No  Homicidal Thoughts:Homicidal Thoughts: No   Sensorium  Memory: Immediate Fair; Recent Fair; Remote Fair  Judgment: Fair  Insight: Fair   Chartered certified accountant: Fair  Attention Span: Fair  Recall: [employer] of Knowledge: Fair  Language: Fair   Psychomotor Activity  Psychomotor Activity: Psychomotor Activity: Normal   Assets  Assets: Manufacturing systems engineer; Desire for Improvement   Sleep  Sleep: Sleep: Good Number of Hours of Sleep: 7.75    Physical Exam: Physical Exam Constitutional:      Appearance: Normal appearance. He is normal weight.  Neurological:     General: No focal deficit present.     Mental Status: He is alert and oriented to person, place, and time. Mental status is at baseline.  Psychiatric:        Mood and Affect: Mood normal.        Behavior: Behavior normal.        Thought Content: Thought content normal.    Review of Systems  Psychiatric/Behavioral:  Positive for depression. Negative for substance abuse. The patient is not nervous/anxious.   All other systems reviewed and are negative.  Blood pressure (!) 137/92, pulse (!) 103, temperature 98.5 F (36.9 C), temperature source Oral, resp. rate 16, height 5\' 9"  (1.753 m), weight 107.5 kg, SpO2 100%. Body mass index is 35 kg/m.  Treatment Plan Summary: ASSESSMENT:  Diagnoses / Active Problems: Major depression recurrent with suicidal ideations Substance use disorder  PLAN: Safety and Monitoring:  --  Voluntary admission to inpatient psychiatric unit for safety,  stabilization and treatment  -- Daily contact with patient to assess and evaluate symptoms and progress in treatment  -- Patient's case to be discussed in multi-disciplinary team meeting  -- Observation Level : q15 minute checks  -- Vital signs:  q12 hours  -- Precautions: suicide, elopement, and assault  2. Psychiatric Diagnoses and Treatment:    --  The risks/benefits/side-effects/alternatives to this medication were discussed in detail with the patient and time was given for questions. The patient consents to medication trial.  -- Patient refused to take any sedating medications but is willing to restart Zoloft at 50 mg a day  -- Metabolic profile and EKG monitoring obtained while on an atypical antipsychotic (BMI: Lipid Panel: HbgA1c: QTc:) as indicated  -- Encouraged patient to participate in unit milieu and in scheduled group therapies   -- Short Term Goals: Ability to identify changes in lifestyle to reduce recurrence of condition will improve, Ability to verbalize feelings will improve, Ability to disclose and discuss suicidal ideas, Ability to demonstrate self-control will improve, and Ability to identify triggers associated with substance abuse/mental health issues will improve  -- Long Term Goals: Improvement in symptoms so as ready for discharge    3. Medical Issues Being Addressed:   Tobacco Use Disorder  -- Nicotine patch 21mg /24 hours ordered  -- Smoking cessation encouraged  4. Discharge Planning:   -- Social work and case management to assist with discharge planning and identification of hospital follow-up needs prior to discharge  -- Estimated LOS: [facility].  -- Discharge Concerns: Need to establish a safety plan; Medication compliance and effectiveness  -- Discharge Goals: Return home with outpatient referrals for mental health follow-up including medication management/psychotherapy   Observation Level/Precautions:  15 minute  checks  Laboratory:  CBC Chemistry Profile HbAIC  Psychotherapy:    Medications: Zoloft 50 mg a day. Clonidine 0.1 mg  every 6 hours as needed for hypertension.  Consultations: Possible medical consult if he is blood pressure he becomes symptomatic.  Discharge Concerns: Safety plan.  [facility].  Estimated LOS: Possible discharge by Thursday.  Other:       I certify that inpatient services furnished can reasonably be expected to improve the patient's condition.    Rex Kras, MD 11/26/20249:57 AM Patient ID: [name], male   DOB: Sep 22, 1980, 42 y.o.   MRN: [id number]

## 2023-07-28 NOTE — Plan of Care (Signed)
  Problem: Education: Goal: Knowledge of [facility] Education information/materials will improve Outcome: Progressing Goal: Emotional status will improve Outcome: Progressing   

## 2023-07-29 ENCOUNTER — Encounter (HOSPITAL_COMMUNITY): Payer: Self-pay

## 2023-07-29 DIAGNOSIS — F332 Major depressive disorder, recurrent severe without psychotic features: Secondary | ICD-10-CM | POA: Diagnosis not present

## 2023-07-29 LAB — HSV CULTURE AND TYPING

## 2023-07-29 NOTE — [facility] Group Notes (Signed)
Adult Psychoeducational Group Note  Date:  07/29/2023 Time:  10:28 PM  Group Topic/Focus:  Wrap-Up Group:   The focus of this group is to help patients review their daily goal of treatment and discuss progress on daily workbooks.  Participation Level:  Active  Participation Quality:  Appropriate  Affect:  Appropriate  Cognitive:  Appropriate  Insight: Appropriate  Engagement in Group:  Engaged  Modes of Intervention:  Discussion and Support  Additional Comments:  Pt attended and participated in NA group.  Tiffony Kite Katrinka Blazing 07/29/2023, 10:28 PM

## 2023-07-29 NOTE — Plan of Care (Signed)
  Problem: Education: Goal: Emotional status will improve Outcome: Progressing Goal: Mental status will improve Outcome: Progressing Goal: Verbalization of understanding the information provided will improve Outcome: Progressing   Problem: Activity: Goal: Sleeping patterns will improve Outcome: Progressing   Problem: Coping: Goal: Ability to verbalize frustrations and anger appropriately will improve Outcome: Progressing   Problem: Safety: Goal: Periods of time without injury will increase Outcome: Progressing   Problem: Activity: Goal: Interest or engagement in activities will improve Outcome: Progressing

## 2023-07-29 NOTE — Progress Notes (Addendum)
Pt denied SI/HI/AVH this morning. Pt denied having anxiety or depression this morning. Pt's only complaint is that he feels "restless". Pt has been pleasant, calm, and cooperative throughout the shift. Pt given scheduled medications as prescribed. Q15 min checks verified for safety. Patient verbally contracts for safety. Patient compliant with medications and treatment plan. Patient is interacting well on the unit. Pt is safe on the unit.   07/29/23 0800  Psych Admission Type (Psych Patients Only)  Admission Status Voluntary  Psychosocial Assessment  Patient Complaints Restlessness  Eye Contact Fair  Facial Expression Animated  Affect Appropriate to circumstance  Speech Logical/coherent  Interaction Assertive  Motor Activity Other (Comment) (WDL)  Appearance/Hygiene Unremarkable  Behavior Characteristics Appropriate to situation;Cooperative  Mood Pleasant  Thought Process  Coherency WDL  Content WDL  Delusions None reported or observed  Perception WDL  Hallucination None reported or observed  Judgment Impaired  Confusion None  Danger to Self  Current suicidal ideation? Denies  Description of Suicide Plan No plan  Self-Injurious Behavior No self-injurious ideation or behavior indicators observed or expressed   Agreement Not to Harm Self Yes  Description of Agreement Verbal  Danger to Others  Danger to Others None reported or observed  Danger to Others Abnormal  Harmful Behavior to others No threats or harm toward other people  Destructive Behavior No threats or harm toward property

## 2023-07-29 NOTE — Progress Notes (Signed)
Psychiatric progress note adult  Patient Identification: Brian Nolan MRN:  [id number] Date of Evaluation:  07/29/2023 Chief Complaint:  MDD (major depressive disorder), recurrent severe, without psychosis (HCC) [F33.2] Principal Diagnosis: <principal problem not specified> Diagnosis:  Active Problems:   MDD (major depressive disorder), recurrent severe, without psychosis (HCC)   Reason for admission   42 year old male admitted through the emergency room for symptoms of depression and suicidal ideations.  This was in the context of substance use disorder.  Chart review from last 24 hours   Staff reports the patient has been cooperative and taking medications as prescribed and received trazodone, hydroxyzine and clonidine as needed.  His blood pressure is stabilizing.  Patient reports no side effects and has no headache or dizziness.  He slept 8 hours.  Yesterday, the psychiatry team made the following recommendations:  Zoloft 50 mg a day. Clonidine 0.1 mg as needed for hypertension Trazodone 50 mg at night as needed for sleep Information obtained during interview   Patient continues to gradually improve and continues to communicate about his goals and objectives.  He does want to go to long-term rehab because of his concern of relapsing.  He is at high risk for relapse given his frequent readmissions.  Social work reports that the patient was [facility] for [facility].  To prevent relapse over the long holiday, patient will be discharged directly to [facility] on Monday.   Associated Signs/Symptoms: Depression Symptoms:  depressed mood, psychomotor agitation, difficulty concentrating, suicidal thoughts without plan, anxiety, (Hypo) Manic Symptoms:  Impulsivity, Irritable Mood, Anxiety Symptoms:  Excessive Worry, Psychotic Symptoms:   Denied any. PTSD Symptoms: Negative Total Time spent with patient: 30 minutes  Past Psychiatric History: Previously hospitalized  at [facility] behavioral health in September.  He was on Zoloft, Seroquel, trazodone.  Patient gives a history of major depression and grief reaction.  Is the patient at risk to self? Yes.    Has the patient been a risk to self in the past 6 months? Yes.    Has the patient been a risk to self within the distant past? Yes.    Is the patient a risk to others? No.  Has the patient been a risk to others in the past 6 months? No.  Has the patient been a risk to others within the distant past? No.   [place] Scale:  Flowsheet Row [facility] Most recent reading at 07/23/2023  5:15 PM ED from 07/23/2023 in [facility] Emergency Department [facility] Most recent reading at 07/23/2023  8:46 AM Video Visit from 07/06/2023 in [facility] Most recent reading at 07/06/2023 10:18 AM  C-SSRS RISK CATEGORY Moderate Risk Moderate Risk Error: Question 6 not populated        Prior Inpatient Therapy: Yes.   If yes, describe last hospitalized in September. Prior Outpatient Therapy: No. If yes, describe noncompliant with medications and outpatient follow-up.  Alcohol Screening: 1. How often do you have a drink containing alcohol?: 2 to 4 times a month 2. How many drinks containing alcohol do you have on a typical day when you are drinking?: 5 or 6 3. How often do you have six or more drinks on one occasion?: Weekly AUDIT-C Score: 7 4. How often during the last year have you found that you were not able to stop drinking once you had started?: Weekly 5. How often during the last year  have you failed to do what was normally expected from you because of drinking?: Monthly 6. How often during the last year have you needed a first drink in the morning to get yourself going after a heavy drinking session?: Weekly 7. How often during the last year have you had a feeling of guilt of remorse after drinking?:  Weekly 8. How often during the last year have you been unable to remember what happened the night before because you had been drinking?: Never 9. Have you or someone else been injured as a result of your drinking?: No 10. Has a relative or friend or a doctor or another health worker been concerned about your drinking or suggested you cut down?: No Alcohol Use Disorder Identification Test Final Score (AUDIT): 18 Alcohol Brief Interventions/Follow-up: Alcohol education/Brief advice Substance Abuse History in the last 12 months:  Yes.   Consequences of Substance Abuse: Family Consequences:  Increase conflicts, loss of job. Patient reports emotional lability. Previous Psychotropic Medications: Yes  Psychological Evaluations: No  Past Medical History:  Past Medical History:  Diagnosis Date   Depression    History of hiatal hernia    Schizophrenia (HCC)    Substance abuse (HCC)     Past Surgical History:  Procedure Laterality Date   NO PAST SURGERIES     Family History:  Family History  Problem Relation Age of Onset   Schizophrenia Maternal Uncle    Diabetes Mellitus II Father    Family Psychiatric  History: Unknown at this time. Tobacco Screening:  Social History   Tobacco Use  Smoking Status Every Day   Current packs/day: 1.00   Average packs/day: 1 pack/day for 14.5 years (14.5 ttl pk-yrs)   Types: Cigarettes   Start date: 01/31/2008   Last attempt to quit: 01/30/2018   Passive exposure: Never  Smokeless Tobacco Never    BH Tobacco Counseling     Are you interested in Tobacco Cessation Medications?  No, patient refused Counseled patient on smoking cessation:  Refused/Declined practical counseling Reason Tobacco Screening Not Completed: No value filed.       Social History:  Social History   Substance and Sexual Activity  Alcohol Use Yes   Alcohol/week: 6.0 - 24.0 standard drinks of alcohol   Types: 6 - 24 Cans of beer per week   Comment: 12 ppd     Social  History   Substance and Sexual Activity  Drug Use Yes   Types: "Crack" cocaine, Marijuana, Cocaine    Additional Social History: Marital status: Long term relationship Long term relationship, how long?: 2 plus years What types of issues is patient dealing with in the relationship?: "current situations due to my choices " Are you sexually active?: Yes Does patient have children?: Yes How many children?: 2 How is patient's relationship with their children?: "good with my oldest one and my oldest girl i need to work on"                         Allergies:  No Known Allergies Lab Results:  No results found for this or any previous visit (from the past 48 hour(s)).   Blood Alcohol level:  Lab Results  Component Value Date   ETH <10 07/23/2023   ETH <10 05/31/2023    Metabolic Disorder Labs:  Lab Results  Component Value Date   HGBA1C 5.7 (H) 05/31/2023   MPG 116.89 05/31/2023   MPG 99.67 11/16/2021   No results found  for: "PROLACTIN" Lab Results  Component Value Date   CHOL 185 06/02/2023   TRIG 443 (H) 06/02/2023   HDL 41 06/02/2023   CHOLHDL 4.5 06/02/2023   VLDL UNABLE TO CALCULATE IF TRIGLYCERIDE OVER 400 mg/dL 28/41/3244   LDLCALC UNABLE TO CALCULATE IF TRIGLYCERIDE OVER 400 mg/dL 09/03/7251   LDLCALC 89 11/16/2021    Current Medications: Current Facility-Administered Medications  Medication Dose Route Frequency Provider Last Rate Last Admin   acetaminophen (TYLENOL) tablet 650 mg  650 mg Oral Q6H PRN Motley-Mangrum, Jadeka A, PMHNP       alum & mag hydroxide-simeth (MAALOX/MYLANTA) 200-200-20 MG/5ML suspension 30 mL  30 mL Oral Q4H PRN Motley-Mangrum, Jadeka A, PMHNP   30 mL at 07/27/23 2015   cloNIDine (CATAPRES) tablet 0.1 mg  0.1 mg Oral QID PRN Rex Kras, MD   0.1 mg at 07/28/23 1251   hydrOXYzine (ATARAX) tablet 25 mg  25 mg Oral TID PRN Motley-Mangrum, Jadeka A, PMHNP   25 mg at 07/28/23 2156   magnesium hydroxide (MILK OF MAGNESIA)  suspension 30 mL  30 mL Oral Daily PRN Motley-Mangrum, Jadeka A, PMHNP       OLANZapine (ZYPREXA) injection 5 mg  5 mg Intramuscular TID PRN Motley-Mangrum, Jadeka A, PMHNP       pantoprazole (PROTONIX) EC tablet 40 mg  40 mg Oral Daily Massengill, Nathan, MD   40 mg at 07/29/23 0855   sertraline (ZOLOFT) tablet 50 mg  50 mg Oral Daily Rex Kras, MD   50 mg at 07/29/23 0855   traZODone (DESYREL) tablet 50 mg  50 mg Oral QHS PRN Motley-Mangrum, Jadeka A, PMHNP   50 mg at 07/28/23 2156   PTA Medications: Medications Prior to Admission  Medication Sig Dispense Refill Last Dose   hydrOXYzine (ATARAX) 25 MG tablet Take 1 tablet (25 mg total) by mouth 3 (three) times daily as needed for anxiety. (Patient not taking: Reported on 07/24/2023) 90 tablet 3 Not Taking   pantoprazole (PROTONIX) 40 MG tablet Take 1 tablet (40 mg total) by mouth daily. (Patient not taking: Reported on 07/24/2023) 30 tablet 3 Not Taking   QUEtiapine (SEROQUEL) 50 MG tablet Take 1 tablet (50 mg total) by mouth at bedtime. (Patient not taking: Reported on 07/24/2023) 30 tablet 3 Not Taking   sertraline (ZOLOFT) 100 MG tablet Take 1 tablet (100 mg total) by mouth daily. (Patient not taking: Reported on 07/24/2023) 30 tablet 3 Not Taking   traZODone (DESYREL) 50 MG tablet Take 1 tablet (50 mg total) by mouth at bedtime as needed for sleep. (Patient not taking: Reported on 07/24/2023) 30 tablet 3 Not Taking    Musculoskeletal: Strength & Muscle Tone: within normal limits Gait & Station: normal Patient leans: N/A            Psychiatric Specialty Exam:  Presentation  General Appearance:  Appropriate for Environment  Eye Contact: Fair  Speech: Clear and Coherent  Speech Volume: Decreased  Handedness: Right   Mood and Affect  Mood: Anxious; Dysphoric  Affect: Restricted   Thought Process  Thought Processes: Coherent  Duration of Psychotic Symptoms:N/A Past Diagnosis of Schizophrenia or  Psychoactive disorder: No  Descriptions of Associations:Intact  Orientation:Full (Time, Place and Person)  Thought Content:Logical  Hallucinations:Hallucinations: None  Ideas of Reference:None  Suicidal Thoughts:Suicidal Thoughts: No  Homicidal Thoughts:Homicidal Thoughts: No   Sensorium  Memory: Immediate Fair; Remote Fair; Recent Fair  Judgment: Fair  Insight: Fair   Art therapist  Concentration: Fair  Attention Span: Good  Recall: Good  Fund of Knowledge: Good  Language: Good   Psychomotor Activity  Psychomotor Activity: Psychomotor Activity: Normal   Assets  Assets: Desire for Improvement; Communication Skills   Sleep  Sleep: Sleep: Fair Number of Hours of Sleep: 7.75    Physical Exam: Physical Exam Constitutional:      Appearance: Normal appearance. He is normal weight.  Neurological:     General: No focal deficit present.     Mental Status: He is alert and oriented to person, place, and time. Mental status is at baseline.  Psychiatric:        Mood and Affect: Mood normal.        Behavior: Behavior normal.        Thought Content: Thought content normal.    Review of Systems  Psychiatric/Behavioral:  Positive for depression. Negative for substance abuse. The patient is not nervous/anxious.   All other systems reviewed and are negative.  Blood pressure 113/80, pulse 91, temperature 98.4 F (36.9 C), temperature source Oral, resp. rate 16, height 5\' 9"  (1.753 m), weight 107.5 kg, SpO2 100%. Body mass index is 35 kg/m.  Treatment Plan Summary: ASSESSMENT:  Diagnoses / Active Problems: Major depression recurrent with suicidal ideations Substance use disorder  PLAN: Safety and Monitoring:  --  Voluntary admission to inpatient psychiatric unit for safety, stabilization and treatment  -- Daily contact with patient to assess and evaluate symptoms and progress in treatment  -- Patient's case to be discussed in  multi-disciplinary team meeting  -- Observation Level : q15 minute checks  -- Vital signs:  q12 hours  -- Precautions: suicide, elopement, and assault  2. Psychiatric Diagnoses and Treatment:    --  The risks/benefits/side-effects/alternatives to this medication were discussed in detail with the patient and time was given for questions. The patient consents to medication trial.  -- Patient refused to take any sedating medications but is willing to restart Zoloft at 50 mg a day  -- Metabolic profile and EKG monitoring obtained while on an atypical antipsychotic (BMI: Lipid Panel: HbgA1c: QTc:) as indicated  -- Encouraged patient to participate in unit milieu and in scheduled group therapies   -- Short Term Goals: Ability to identify changes in lifestyle to reduce recurrence of condition will improve, Ability to verbalize feelings will improve, Ability to disclose and discuss suicidal ideas, Ability to demonstrate self-control will improve, and Ability to identify triggers associated with substance abuse/mental health issues will improve  -- Long Term Goals: Improvement in symptoms so as ready for discharge    3. Medical Issues Being Addressed:   Tobacco Use Disorder  -- Nicotine patch 21mg /24 hours ordered  -- Smoking cessation encouraged  4. Discharge Planning:   -- Social work and case management to assist with discharge planning and identification of hospital follow-up needs prior to discharge  -- Estimated LOS: [facility].  -- Discharge Concerns: Need to establish a safety plan; Medication compliance and effectiveness  -- Discharge Goals: Return home with outpatient referrals for mental health follow-up including medication management/psychotherapy   Observation Level/Precautions:  15 minute checks  Laboratory:  CBC Chemistry Profile HbAIC  Psychotherapy:    Medications: Zoloft 50 mg a day. Clonidine 0.1 mg every 6 hours as needed for  hypertension.  Consultations: Possible medical consult if he is blood pressure he becomes symptomatic.  Discharge Concerns: Safety plan.  Accepted at [facility]  Estimated LOS: Discharge on Monday today Mark  Other:  I certify that inpatient services furnished can reasonably be expected to improve the patient's condition.    Rex Kras, MD 11/27/202412:24 PM Patient ID: Brian Nolan, male   DOB: Jul 02, 1981, 42 y.o.   MRN: [id number]  Patient ID: Brian Nolan, male   DOB: [birth year], 42 y.o.   MRN: [id number]

## 2023-07-29 NOTE — Group Note (Signed)
Recreation Therapy Group Note   Group Topic:Team Building  Group Date: 07/29/2023 Start Time: 0935 End Time: 1015 Facilitators: Erika Hussar-McCall, LRT,CTRS Location: [address] Dayroom   Group Topic: Communication, Team Building, Problem Solving  Goal Area(s) Addresses:  Patient will effectively work with peer towards shared goal.  Patient will identify skills used to make activity successful.  Patient will identify how skills used during activity can be used to reach post d/c goals.   Intervention: STEM Activity  Group Description: Straw Bridge. In teams of 3-5, patients were given 15 plastic drinking straws and an equal length of masking tape. Using the materials provided, patients were instructed to build a free standing bridge-like structure to suspend an everyday item (ex: puzzle box) off of the floor or table surface. All materials were required to be used by the team in their design. LRT facilitated post-activity discussion reviewing team process. Patients were encouraged to reflect how the skills used in this activity can be generalized to daily life post discharge.   Education: Pharmacist, community, Scientist, physiological, Discharge Planning   Education Outcome: Acknowledges education/In group clarification offered/Needs additional education.    Affect/Mood: N/A   Participation Level: Did not attend    Clinical Observations/Individualized Feedback:     Plan: Continue to engage patient in RT group sessions 2-3x/week.   Merville Hijazi-McCall, LRT,CTRS 07/29/2023 12:26 PM

## 2023-07-29 NOTE — Progress Notes (Signed)
   07/28/23 2100  Psych Admission Type (Psych Patients Only)  Admission Status Voluntary  Psychosocial Assessment  Patient Complaints None  Eye Contact Fair  Facial Expression Other (Comment) (Relaxed)  Affect Appropriate to circumstance  Speech Logical/coherent  Interaction Assertive  Motor Activity Restless  Appearance/Hygiene Unremarkable  Behavior Characteristics Appropriate to situation  Mood Pleasant  Thought Process  Coherency WDL  Content WDL  Delusions None reported or observed  Perception WDL  Hallucination None reported or observed  Judgment Limited  Confusion None  Danger to Self  Current suicidal ideation? Denies  Self-Injurious Behavior No self-injurious ideation or behavior indicators observed or expressed   Agreement Not to Harm Self Yes  Description of Agreement Verbal  Danger to Others  Danger to Others None reported or observed  Danger to Others Abnormal  Harmful Behavior to others No threats or harm toward other people  Destructive Behavior No threats or harm toward property

## 2023-07-29 NOTE — Progress Notes (Addendum)
Pt has been approved for [facility] [place] [phone] and will be able to accept him on Monday. Pt will need to arrive by 9:00 am. Pt will need a 30 day supply of medications with one refill. CSW will continue to follow and update Treatment Team.   Dr. Enedina Finner made aware and is in agreement with plan.

## 2023-07-29 NOTE — Plan of Care (Signed)
  Problem: Education: Goal: Knowledge of [facility] Education information/materials will improve Outcome: Progressing Goal: Emotional status will improve Outcome: Progressing Goal: Mental status will improve Outcome: Progressing   Problem: Activity: Goal: Interest or engagement in activities will improve Outcome: Progressing   Problem: Activity: Goal: Sleeping patterns will improve Outcome: Progressing   Problem: Coping: Goal: Ability to verbalize frustrations and anger appropriately will improve Outcome: Progressing

## 2023-07-29 NOTE — BH IP Treatment Plan (Signed)
Interdisciplinary Treatment and Diagnostic Plan Update  07/29/2023 Time of Session: 11:25AM - UPDATE Brian Nolan MRN: [id number]  Principal Diagnosis: <principal problem not specified>  Secondary Diagnoses: Active Problems:   MDD (major depressive disorder), recurrent severe, without psychosis (HCC)   Current Medications:  Current Facility-Administered Medications  Medication Dose Route Frequency Provider Last Rate Last Admin   acetaminophen (TYLENOL) tablet 650 mg  650 mg Oral Q6H PRN Motley-Mangrum, Jadeka A, PMHNP       alum & mag hydroxide-simeth (MAALOX/MYLANTA) 200-200-20 MG/5ML suspension 30 mL  30 mL Oral Q4H PRN Motley-Mangrum, Jadeka A, PMHNP   30 mL at 07/27/23 2015   cloNIDine (CATAPRES) tablet 0.1 mg  0.1 mg Oral QID PRN Rex Kras, MD   0.1 mg at 07/28/23 1251   hydrOXYzine (ATARAX) tablet 25 mg  25 mg Oral TID PRN Motley-Mangrum, Jadeka A, PMHNP   25 mg at 07/28/23 2156   magnesium hydroxide (MILK OF MAGNESIA) suspension 30 mL  30 mL Oral Daily PRN Motley-Mangrum, Jadeka A, PMHNP       OLANZapine (ZYPREXA) injection 5 mg  5 mg Intramuscular TID PRN Motley-Mangrum, Jadeka A, PMHNP       pantoprazole (PROTONIX) EC tablet 40 mg  40 mg Oral Daily Massengill, Nathan, MD   40 mg at 07/29/23 0855   sertraline (ZOLOFT) tablet 50 mg  50 mg Oral Daily Rex Kras, MD   50 mg at 07/29/23 0855   traZODone (DESYREL) tablet 50 mg  50 mg Oral QHS PRN Motley-Mangrum, Jadeka A, PMHNP   50 mg at 07/28/23 2156   PTA Medications: Medications Prior to Admission  Medication Sig Dispense Refill Last Dose   hydrOXYzine (ATARAX) 25 MG tablet Take 1 tablet (25 mg total) by mouth 3 (three) times daily as needed for anxiety. (Patient not taking: Reported on 07/24/2023) 90 tablet 3 Not Taking   pantoprazole (PROTONIX) 40 MG tablet Take 1 tablet (40 mg total) by mouth daily. (Patient not taking: Reported on 07/24/2023) 30 tablet 3 Not Taking   QUEtiapine (SEROQUEL) 50 MG  tablet Take 1 tablet (50 mg total) by mouth at bedtime. (Patient not taking: Reported on 07/24/2023) 30 tablet 3 Not Taking   sertraline (ZOLOFT) 100 MG tablet Take 1 tablet (100 mg total) by mouth daily. (Patient not taking: Reported on 07/24/2023) 30 tablet 3 Not Taking   traZODone (DESYREL) 50 MG tablet Take 1 tablet (50 mg total) by mouth at bedtime as needed for sleep. (Patient not taking: Reported on 07/24/2023) 30 tablet 3 Not Taking    Patient Stressors: Financial difficulties   Medication change or noncompliance   Occupational concerns   Substance abuse    Patient Strengths: Capable of independent living  Communication skills  Religious Affiliation  Supportive family/friends  Work skills   Treatment Modalities: Medication Management, Group therapy, Case management,  1 to 1 session with clinician, Psychoeducation, Recreational therapy.   Physician Treatment Plan for Primary Diagnosis: <principal problem not specified> Long Term Goal(s): Improvement in symptoms so as ready for discharge   Short Term Goals: Ability to identify changes in lifestyle to reduce recurrence of condition will improve Ability to verbalize feelings will improve Ability to disclose and discuss suicidal ideas Ability to demonstrate self-control will improve Ability to identify triggers associated with substance abuse/mental health issues will improve  Medication Management: Evaluate patient's response, side effects, and tolerance of medication regimen.  Therapeutic Interventions: 1 to 1 sessions, Unit Group sessions and Medication administration.  Evaluation of Outcomes:  Progressing  Physician Treatment Plan for Secondary Diagnosis: Active Problems:   MDD (major depressive disorder), recurrent severe, without psychosis (HCC)  Long Term Goal(s): Improvement in symptoms so as ready for discharge   Short Term Goals: Ability to identify changes in lifestyle to reduce recurrence of condition will  improve Ability to verbalize feelings will improve Ability to disclose and discuss suicidal ideas Ability to demonstrate self-control will improve Ability to identify triggers associated with substance abuse/mental health issues will improve     Medication Management: Evaluate patient's response, side effects, and tolerance of medication regimen.  Therapeutic Interventions: 1 to 1 sessions, Unit Group sessions and Medication administration.  Evaluation of Outcomes: Progressing   RN Treatment Plan for Primary Diagnosis: <principal problem not specified> Long Term Goal(s): Knowledge of disease and therapeutic regimen to maintain health will improve  Short Term Goals: Ability to remain free from injury will improve, Ability to verbalize frustration and anger appropriately will improve, Ability to verbalize feelings will improve, Ability to disclose and discuss suicidal ideas, and Compliance with prescribed medications will improve  Medication Management: RN will administer medications as ordered by provider, will assess and evaluate patient's response and provide education to patient for prescribed medication. RN will report any adverse and/or side effects to prescribing provider.  Therapeutic Interventions: 1 on 1 counseling sessions, Psychoeducation, Medication administration, Evaluate responses to treatment, Monitor vital signs and CBGs as ordered, Perform/monitor CIWA, COWS, AIMS and Fall Risk screenings as ordered, Perform wound care treatments as ordered.  Evaluation of Outcomes: Progressing   LCSW Treatment Plan for Primary Diagnosis: <principal problem not specified> Long Term Goal(s): Safe transition to appropriate next level of care at discharge, Engage patient in therapeutic group addressing interpersonal concerns.  Short Term Goals: Engage patient in aftercare planning with referrals and resources, Increase ability to appropriately verbalize feelings, Facilitate acceptance of  mental health diagnosis and concerns, Facilitate patient progression through stages of change regarding substance use diagnoses and concerns, and Identify triggers associated with mental health/substance abuse issues  Therapeutic Interventions: Assess for all discharge needs, 1 to 1 time with Social worker, Explore available resources and support systems, Assess for adequacy in community support network, Educate family and significant other(s) on suicide prevention, Complete Psychosocial Assessment, Interpersonal group therapy.  Evaluation of Outcomes: Progressing   Progress in Treatment: Attending groups: Yes. (Some) Participating in groups: Yes. Taking medication as prescribed: Yes. Toleration medication: Yes. Family/Significant other contact made: No, will contact:  pt declined Patient understands diagnosis: Yes. Discussing patient identified problems/goals with staff: Yes. Medical problems stabilized or resolved: Yes. Denies suicidal/homicidal ideation: Yes. Issues/concerns per patient self-inventory: No.   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 CSW will continue to follow and assess for appropriate referrals and possible discharge planning.      Reason for Continuation of Hospitalization: Depression Medication stabilization Suicidal ideation Withdrawal symptoms   Estimated Length of Stay: 5 days, d/c plan to go to [facility] on Monday  Last 3 [place] Suicide Severity Risk Score: Flowsheet Row [facility] (Current[facility] Most recent reading at 07/23/2023   5:15 PM ED from 07/23/2023 in [facility] Most recent reading at 07/23/2023  8:46 AM Video Visit from 07/06/2023 in [facility] Most recent reading at 07/06/2023 10:18 AM  C-SSRS RISK CATEGORY Moderate Risk Moderate Risk Error: Question 6 not populated       Last [facility] 2/9 Scores:    07/06/2023   10:18 AM 05/31/2023    3:41 PM 12/25/2021   10:31 AM  Depression screen PHQ 2/9  Decreased Interest 0 3 0  Down, Depressed, Hopeless 1 3 0  PHQ - 2 Score 1 6 0  Altered sleeping 0 2   Tired, decreased energy 0 2   Change in appetite 0 1   Feeling bad or failure about yourself  2 2   Trouble concentrating 0 1   Moving slowly or fidgety/restless 0 0   Suicidal thoughts 0 2   PHQ-9 Score 3 16   Difficult doing work/chores Not difficult at all Very difficult     Scribe for Treatment Team: Kathi Der, LCSWA 07/29/2023 1:47 PM

## 2023-07-30 DIAGNOSIS — F332 Major depressive disorder, recurrent severe without psychotic features: Secondary | ICD-10-CM | POA: Diagnosis not present

## 2023-07-30 NOTE — Group Note (Signed)
[facility] LCSW Group Therapy Note  Date/Time: 07/30/2023 at 11:00AM - 12:00PM  Type of Therapy/Topic:  Group Therapy:  Journey and Not the Destination  Participation Level:  Active  Description of Group:   This group will address the importance of considering the journey and not just the destination. Patients will be encouraged to process areas in their lives where they found it hard to focus on the good rather than the bad, and identify reasons for maintaining such thought pattern. Facilitator will guide patients utilizing problem- solving interventions to address and improve the way each patient views themselves and their situation. Patients will work through understanding and applying humility when it comes to life changes and the interactions we have with others. Patients will be encouraged to explore ways that they will move forward throughout life's journey, and make healthier decisions for themselves.   Therapeutic Goals: Patient will identify two or more emotions or situations that they observed or felt throughout watching the video clip. Patient will identify signs where they may have responded to someone or situation due to their current circumstance.  Patient will identify two ways to set better habits in order to achieve more peace in their lives. Patient will demonstrate ability to communicate their needs through discussion and/or role plays.  Summary of Patient Progress: Patient actively participated in group on today. Patient reports feeling encouraged from watching the video, and his was able to provide feedback to staff and peers. Patient reports understanding the importance of having wisdom in order to avoid foolish things and mistakes. Patient reports enjoying hearing the feedback. No issues to report.   Therapeutic Modalities:   Cognitive Behavioral Therapy Solution-Focused Therapy Assertiveness Training  Fernande Boyden, LCSW Clinical Social Worker [facility]

## 2023-07-30 NOTE — Plan of Care (Signed)
  Problem: Education: Goal: Emotional status will improve Outcome: Progressing Goal: Mental status will improve Outcome: Progressing   Problem: Activity: Goal: Interest or engagement in activities will improve Outcome: Progressing Goal: Sleeping patterns will improve Outcome: Progressing

## 2023-07-30 NOTE — Group Note (Signed)
Date:  07/30/2023 Time:  3:15 PM  Group Topic/Focus:  Identifying Needs:   The focus of this group is to help patients identify their personal needs that have been historically problematic and identify healthy behaviors to address their needs.    Participation Level:  Active  Participation Quality:  Sharing and Supportive  Affect:  Appropriate  Cognitive:  Appropriate  Insight: Appropriate  Engagement in Group:  Engaged  Modes of Intervention:  Discussion and Education  Additional Comments:    Brian Nolan 07/30/2023, 3:15 PM

## 2023-07-30 NOTE — Progress Notes (Signed)
   07/29/23 2344  Psych Admission Type (Psych Patients Only)  Admission Status Voluntary  Psychosocial Assessment  Patient Complaints Sleep disturbance  Eye Contact Fair  Facial Expression Animated  Affect Appropriate to circumstance  Speech Logical/coherent  Interaction Assertive  Motor Activity Slow  Appearance/Hygiene Unremarkable  Behavior Characteristics Cooperative  Mood Pleasant  Thought Process  Coherency WDL  Content WDL  Delusions WDL  Perception WDL  Hallucination None reported or observed  Judgment Limited  Confusion WDL  Danger to Self  Current suicidal ideation? Denies  Danger to Others  Danger to Others None reported or observed   Pt states he had a good day anxiety 0/10 and depression 0/10, states he wants to get back to work as a [employer], pleasant, cooperative, denies SI/HI hallucinations (a) 15 min checks (r) safety maintained.

## 2023-07-30 NOTE — Discharge Planning (Signed)
LCSW spoke with patient on this morning to confirm that he is aware of the plan to discharge on Monday to [facility] by 9 AM.  Patient reports understanding of plan and reports no concerns at this time. Patient made aware that transportation will be provided via hospital to facility. Patient expressed appreciation. LCSW will continue to follow and provide support to patient while in the hospital.  Fernande Boyden, LCSW Clinical Social Worker [facility]

## 2023-07-30 NOTE — Progress Notes (Signed)
   07/30/23 2300  Psych Admission Type (Psych Patients Only)  Admission Status Voluntary  Psychosocial Assessment  Patient Complaints Anxiety;Depression  Eye Contact Fair  Facial Expression Animated  Affect Appropriate to circumstance  Speech Logical/coherent  Interaction Assertive  Motor Activity Slow  Appearance/Hygiene Unremarkable  Behavior Characteristics Cooperative  Mood Pleasant  Aggressive Behavior  Effect No apparent injury  Thought Process  Coherency WDL  Content WDL  Delusions WDL  Perception WDL  Hallucination None reported or observed  Judgment WDL  Confusion WDL  Danger to Self  Current suicidal ideation? Denies  Danger to Others  Danger to Others None reported or observed  Danger to Others Abnormal  Harmful Behavior to others No threats or harm toward other people

## 2023-07-30 NOTE — Plan of Care (Signed)
  Problem: Education: Goal: Emotional status will improve Outcome: Progressing Goal: Mental status will improve Outcome: Progressing   Problem: Activity: Goal: Interest or engagement in activities will improve Outcome: Progressing Goal: Sleeping patterns will improve Outcome: Progressing   Problem: Safety: Goal: Periods of time without injury will increase Outcome: Progressing   

## 2023-07-30 NOTE — Progress Notes (Signed)
   07/30/23 0603  15 Minute Checks  Location Bedroom  Visual Appearance Calm  Behavior Composed  Sleep (Behavioral Health Patients Only)  Calculate sleep? (Click Yes once per 24 hr at 0600 safety check) Yes  Documented sleep last 24 hours 7.5

## 2023-07-30 NOTE — Progress Notes (Addendum)
Pt denied SI/HI/AVH this morning. Pt has been pleasant, calm, and cooperative throughout the shift. RN provided support and encouragement to patient. Pt given scheduled medications as prescribed. Q15 min checks verified for safety. Patient verbally contracts for safety. Patient compliant with medications and treatment plan. Patient is interacting well on the unit. Pt is safe on the unit.   07/30/23 0900  Psych Admission Type (Psych Patients Only)  Admission Status Voluntary  Psychosocial Assessment  Patient Complaints Anxiety;Depression  Eye Contact Fair  Facial Expression Animated  Affect Appropriate to circumstance  Speech Logical/coherent  Interaction Assertive  Motor Activity Slow  Appearance/Hygiene Unremarkable  Behavior Characteristics Cooperative;Anxious  Mood Pleasant  Thought Process  Coherency WDL  Content WDL  Delusions WDL  Perception WDL  Hallucination None reported or observed  Judgment Impaired  Confusion None  Danger to Self  Current suicidal ideation? Denies  Description of Suicide Plan No plan  Self-Injurious Behavior No self-injurious ideation or behavior indicators observed or expressed   Agreement Not to Harm Self Yes  Description of Agreement Verbal  Danger to Others  Danger to Others None reported or observed  Danger to Others Abnormal  Harmful Behavior to others No threats or harm toward other people

## 2023-07-30 NOTE — Progress Notes (Signed)
Psychiatric progress note adult  Patient Identification: Pledger Debenedetti MRN:  [id number] Date of Evaluation:  07/30/2023 Chief Complaint:  MDD (major depressive disorder), recurrent severe, without psychosis (HCC) [F33.2] Principal Diagnosis: <principal problem not specified> Diagnosis:  Active Problems:   MDD (major depressive disorder), recurrent severe, without psychosis (HCC)   Reason for admission   42 year old male admitted through the emergency room for symptoms of depression and suicidal ideations.  This was in the context of substance use disorder.  Chart review from last 24 hours   Staff reports that the patient has been cooperative and compliant.  He slept 7.5 hours.  He took medications as prescribed.  His blood pressure has been stable. Social work reports that the patient has been accepted at [facility].  Yesterday, the psychiatry team made the following recommendations:  Zoloft 50 mg a day. Clonidine 0.1 mg as needed for hypertension Trazodone 50 mg at night as needed for sleep Information obtained during interview   Patient continues to endorse positive improvement of his symptoms.  His sleep is fair to good.  He still has some anxiety and mild depression but denies any active suicidal or homicidal ideations.  Patient is contracting for safety.  [facility]  He is a high risk for relapse if released too early.      Associated Signs/Symptoms: Depression Symptoms:  depressed mood, psychomotor agitation, difficulty concentrating, suicidal thoughts without plan, anxiety, (Hypo) Manic Symptoms:  Impulsivity, Irritable Mood, Anxiety Symptoms:  Excessive Worry, Psychotic Symptoms:   Denied any. PTSD Symptoms: Negative Total Time spent with patient: 30 minutes  Past Psychiatric History: Previously hospitalized at [facility] behavioral health in September.  He was on Zoloft, Seroquel, trazodone.  Patient gives a history of major  depression and grief reaction.  Is the patient at risk to self? Yes.    Has the patient been a risk to self in the past 6 months? Yes.    Has the patient been a risk to self within the distant past? Yes.    Is the patient a risk to others? No.  Has the patient been a risk to others in the past 6 months? No.  Has the patient been a risk to others within the distant past? No.   [place] Scale:  Flowsheet Row [facility] Most recent reading at 07/23/2023  5:15 PM [facility] Most recent reading at 07/23/2023  8:46 AM Video Visit from 07/06/2023 in [facility] Most recent reading at 07/06/2023 10:18 AM  C-SSRS RISK CATEGORY Moderate Risk Moderate Risk Error: Question 6 not populated        Prior Inpatient Therapy: Yes.   If yes, describe last hospitalized in September. Prior Outpatient Therapy: No. If yes, describe noncompliant with medications and outpatient follow-up.  Alcohol Screening: 1. How often do you have a drink containing alcohol?: 2 to 4 times a month 2. How many drinks containing alcohol do you have on a typical day when you are drinking?: 5 or 6 3. How often do you have six or more drinks on one occasion?: Weekly AUDIT-C Score: 7 4. How often during the last year have you found that you were not able to stop drinking once you had started?: Weekly 5. How often during the last year have you failed to do what was normally expected from you  because of drinking?: Monthly 6. How often during the last year have you needed a first drink in the morning to get yourself going after a heavy drinking session?: Weekly 7. How often during the last year have you had a feeling of guilt of remorse after drinking?: Weekly 8. How often during the last year have you been unable to remember what happened the night before because you  had been drinking?: Never 9. Have you or someone else been injured as a result of your drinking?: No 10. Has a relative or friend or a doctor or another health worker been concerned about your drinking or suggested you cut down?: No Alcohol Use Disorder Identification Test Final Score (AUDIT): 18 Alcohol Brief Interventions/Follow-up: Alcohol education/Brief advice Substance Abuse History in the last 12 months:  Yes.   Consequences of Substance Abuse: Family Consequences:  Increase conflicts, loss of job. Patient reports emotional lability. Previous Psychotropic Medications: Yes  Psychological Evaluations: No  Past Medical History:  Past Medical History:  Diagnosis Date   Depression    History of hiatal hernia    Schizophrenia (HCC)    Substance abuse (HCC)     Past Surgical History:  Procedure Laterality Date   NO PAST SURGERIES     Family History:  Family History  Problem Relation Age of Onset   Schizophrenia Maternal Uncle    Diabetes Mellitus II Father    Family Psychiatric  History: Unknown at this time. Tobacco Screening:  Social History   Tobacco Use  Smoking Status Every Day   Current packs/day: 1.00   Average packs/day: 1 pack/day for 14.5 years (14.5 ttl pk-yrs)   Types: Cigarettes   Start date: 01/31/2008   Last attempt to quit: 01/30/2018   Passive exposure: Never  Smokeless Tobacco Never    BH Tobacco Counseling     Are you interested in Tobacco Cessation Medications?  No, patient refused Counseled patient on smoking cessation:  Refused/Declined practical counseling Reason Tobacco Screening Not Completed: No value filed.       Social History:  Social History   Substance and Sexual Activity  Alcohol Use Yes   Alcohol/week: 6.0 - 24.0 standard drinks of alcohol   Types: 6 - 24 Cans of beer per week   Comment: 12 ppd     Social History   Substance and Sexual Activity  Drug Use Yes   Types: "Crack" cocaine, Marijuana, Cocaine    Additional  Social History: Marital status: Long term relationship Long term relationship, how long?: 2 plus years What types of issues is patient dealing with in the relationship?: "current situations due to my choices " Are you sexually active?: Yes Does patient have children?: Yes How many children?: 2 How is patient's relationship with their children?: "good with my oldest one and my oldest girl i need to work on"                         Allergies:  No Known Allergies Lab Results:  No results found for this or any previous visit (from the past 48 hour(s)).   Blood Alcohol level:  Lab Results  Component Value Date   [facility] <10 07/23/2023   ETH <10 05/31/2023    Metabolic Disorder Labs:  Lab Results  Component Value Date   HGBA1C 5.7 (H) 05/31/2023   MPG 116.89 05/31/2023   MPG 99.67 11/16/2021   No results found for: "PROLACTIN" Lab Results  Component Value Date   CHOL  185 06/02/2023   TRIG 443 (H) 06/02/2023   HDL 41 06/02/2023   CHOLHDL 4.5 06/02/2023   VLDL UNABLE TO CALCULATE IF TRIGLYCERIDE OVER 400 mg/dL 16/06/9603   LDLCALC UNABLE TO CALCULATE IF TRIGLYCERIDE OVER 400 mg/dL 54/05/8118   LDLCALC 89 11/16/2021    Current Medications: Current Facility-Administered Medications  Medication Dose Route Frequency Provider Last Rate Last Admin   acetaminophen (TYLENOL) tablet 650 mg  650 mg Oral Q6H PRN Motley-Mangrum, Jadeka A, PMHNP       alum & mag hydroxide-simeth (MAALOX/MYLANTA) 200-200-20 MG/5ML suspension 30 mL  30 mL Oral Q4H PRN Motley-Mangrum, Jadeka A, PMHNP   30 mL at 07/27/23 2015   cloNIDine (CATAPRES) tablet 0.1 mg  0.1 mg Oral QID PRN Rex Kras, MD   0.1 mg at 07/28/23 1251   hydrOXYzine (ATARAX) tablet 25 mg  25 mg Oral TID PRN Motley-Mangrum, Jadeka A, PMHNP   25 mg at 07/29/23 2109   magnesium hydroxide (MILK OF MAGNESIA) suspension 30 mL  30 mL Oral Daily PRN Motley-Mangrum, Jadeka A, PMHNP       OLANZapine (ZYPREXA) injection 5 mg  5 mg  Intramuscular TID PRN Motley-Mangrum, Jadeka A, PMHNP       pantoprazole (PROTONIX) EC tablet 40 mg  40 mg Oral Daily Massengill, Nathan, MD   40 mg at 07/30/23 1478   sertraline (ZOLOFT) tablet 50 mg  50 mg Oral Daily Rex Kras, MD   50 mg at 07/30/23 2956   traZODone (DESYREL) tablet 50 mg  50 mg Oral QHS PRN Motley-Mangrum, Jadeka A, PMHNP   50 mg at 07/29/23 2109   PTA Medications: Medications Prior to Admission  Medication Sig Dispense Refill Last Dose   hydrOXYzine (ATARAX) 25 MG tablet Take 1 tablet (25 mg total) by mouth 3 (three) times daily as needed for anxiety. (Patient not taking: Reported on 07/24/2023) 90 tablet 3 Not Taking   pantoprazole (PROTONIX) 40 MG tablet Take 1 tablet (40 mg total) by mouth daily. (Patient not taking: Reported on 07/24/2023) 30 tablet 3 Not Taking   QUEtiapine (SEROQUEL) 50 MG tablet Take 1 tablet (50 mg total) by mouth at bedtime. (Patient not taking: Reported on 07/24/2023) 30 tablet 3 Not Taking   sertraline (ZOLOFT) 100 MG tablet Take 1 tablet (100 mg total) by mouth daily. (Patient not taking: Reported on 07/24/2023) 30 tablet 3 Not Taking   traZODone (DESYREL) 50 MG tablet Take 1 tablet (50 mg total) by mouth at bedtime as needed for sleep. (Patient not taking: Reported on 07/24/2023) 30 tablet 3 Not Taking    Musculoskeletal: Strength & Muscle Tone: within normal limits Gait & Station: normal Patient leans: N/A            Psychiatric Specialty Exam:  Presentation  General Appearance:  Casual  Eye Contact: Good  Speech: Clear and Coherent  Speech Volume: Normal  Handedness: Right   Mood and Affect  Mood: Anxious  Affect: Restricted   Thought Process  Thought Processes: Coherent  Duration of Psychotic Symptoms:N/A Past Diagnosis of Schizophrenia or Psychoactive disorder: No  Descriptions of Associations:Intact  Orientation:Full (Time, Place and Person)  Thought  Content:Logical  Hallucinations:Hallucinations: None  Ideas of Reference:None  Suicidal Thoughts:Suicidal Thoughts: No  Homicidal Thoughts:Homicidal Thoughts: No   Sensorium  Memory: Immediate Fair; Remote Fair; Recent Fair  Judgment: Fair  Insight: Fair   Art therapist  Concentration: Fair  Attention Span: Fair  Recall: [employer] of Knowledge: Fair  Language: Fair   Psychomotor  Activity  Psychomotor Activity: Psychomotor Activity: Normal   Assets  Assets: Communication Skills; Desire for Improvement   Sleep  Sleep: Sleep: Good Number of Hours of Sleep: 7.5    Physical Exam: Physical Exam Constitutional:      Appearance: Normal appearance. He is normal weight.  Neurological:     General: No focal deficit present.     Mental Status: He is alert and oriented to person, place, and time. Mental status is at baseline.  Psychiatric:        Mood and Affect: Mood normal.        Behavior: Behavior normal.        Thought Content: Thought content normal.    Review of Systems  Psychiatric/Behavioral:  Positive for depression. Negative for substance abuse. The patient is not nervous/anxious.   All other systems reviewed and are negative.  Blood pressure (!) 118/91, pulse 96, temperature 98.2 F (36.8 C), temperature source Oral, resp. rate 18, height 5\' 9"  (1.753 m), weight 107.5 kg, SpO2 100%. Body mass index is 35 kg/m.  Treatment Plan Summary: ASSESSMENT:  Diagnoses / Active Problems: Major depression recurrent with suicidal ideations Substance use disorder  PLAN: Safety and Monitoring:  --  Voluntary admission to inpatient psychiatric unit for safety, stabilization and treatment  -- Daily contact with patient to assess and evaluate symptoms and progress in treatment  -- Patient's case to be discussed in multi-disciplinary team meeting  -- Observation Level : q15 minute checks  -- Vital signs:  q12 hours  -- Precautions: suicide,  elopement, and assault  2. Psychiatric Diagnoses and Treatment:    --  The risks/benefits/side-effects/alternatives to this medication were discussed in detail with the patient and time was given for questions. The patient consents to medication trial.  -- Patient refused to take any sedating medications but is willing to restart Zoloft at 50 mg a day  -- Metabolic profile and EKG monitoring obtained while on an atypical antipsychotic (BMI: Lipid Panel: HbgA1c: QTc:) as indicated  -- Encouraged patient to participate in unit milieu and in scheduled group therapies   -- Short Term Goals: Ability to identify changes in lifestyle to reduce recurrence of condition will improve, Ability to verbalize feelings will improve, Ability to disclose and discuss suicidal ideas, Ability to demonstrate self-control will improve, and Ability to identify triggers associated with substance abuse/mental health issues will improve  -- Long Term Goals: Improvement in symptoms so as ready for discharge    3. Medical Issues Being Addressed:   Tobacco Use Disorder  -- Nicotine patch 21mg /24 hours ordered  -- Smoking cessation encouraged  4. Discharge Planning:   -- Social work and case management to assist with discharge planning and identification of hospital follow-up needs prior to discharge  -- Estimated LOS: [facility]  -- Discharge Concerns: Need to establish a safety plan; Medication compliance and effectiveness  -- Discharge Goals: Return home with outpatient referrals for mental health follow-up including medication management/psychotherapy   Observation Level/Precautions:  15 minute checks  Laboratory:  CBC Chemistry Profile HbAIC  Psychotherapy:    Medications: Zoloft 50 mg a day. Clonidine 0.1 mg every 6 hours as needed for hypertension.  Consultations: Possible medical consult if he is blood pressure he becomes symptomatic.  Discharge Concerns: Safety  plan.  Accepted at [facility]  Estimated LOS: Discharge on Monday today [facility]  Other:       I certify that inpatient services furnished can reasonably be expected  to improve the patient's condition.    Rex Kras, MD 11/28/202411:10 AM Patient ID: [name], male   DOB: 1981-03-02, 42 y.o.   MRN: [id number]

## 2023-07-31 DIAGNOSIS — F332 Major depressive disorder, recurrent severe without psychotic features: Secondary | ICD-10-CM | POA: Diagnosis not present

## 2023-07-31 NOTE — Group Note (Signed)
Date:  07/31/2023 Time:  1:48 PM  Group Topic/Focus:  Building Self Esteem:   The Focus of this group is helping patients become aware of the effects of self-esteem on their lives, the things they and others do that enhance or undermine their self-esteem, seeing the relationship between their level of self-esteem and the choices they make and learning ways to enhance self-esteem.    Participation Level:  Active  Participation Quality:  Appropriate  Affect:  Appropriate  Cognitive:  Appropriate  Insight: Appropriate  Engagement in Group:  Engaged  Modes of Intervention:  Education  Additional Comments:     Reymundo Poll 07/31/2023, 1:48 PM

## 2023-07-31 NOTE — Progress Notes (Signed)
   07/31/23 0800  Psych Admission Type (Psych Patients Only)  Admission Status Voluntary  Psychosocial Assessment  Patient Complaints Anxiety;Depression  Eye Contact Fair  Facial Expression Animated  Affect Appropriate to circumstance  Speech Logical/coherent  Interaction Assertive  Motor Activity Slow  Appearance/Hygiene Unremarkable  Behavior Characteristics Cooperative;Appropriate to situation  Mood Pleasant  Thought Process  Coherency WDL  Content WDL  Delusions WDL  Perception WDL  Hallucination None reported or observed  Judgment WDL  Confusion WDL  Danger to Self  Current suicidal ideation? Denies  Description of Suicide Plan No plan  Self-Injurious Behavior No self-injurious ideation or behavior indicators observed or expressed   Agreement Not to Harm Self Yes  Description of Agreement Verbal  Danger to Others  Danger to Others None reported or observed  Danger to Others Abnormal  Harmful Behavior to others No threats or harm toward other people

## 2023-07-31 NOTE — Group Note (Signed)
Recreation Therapy Group Note   Group Topic:Problem Solving  Group Date: 07/31/2023 Start Time: 0930 End Time: 1000 Facilitators: Brianni Manthe-McCall, LRT,CTRS Location: [address] Dayroom   Group Topic: Problem Solving  Goal Area(s) Addresses:  Patient will effectively work in a team with other group members. Patient will verbalize importance of using appropriate problem solving techniques.  Patient will identify positive change associated with effective problem solving skills.   Intervention: Worksheets, Pencils  Group Description: Dentist. Patients were given two sheets of brain teasers. Patients were given 20 minutes to try and figure out as many of teasers they could. Patients were also allowed to work together if they chose to. Once patients finished, LRT would go over the answers with the patients.    Education Outcome: Acknowledges understanding/In group clarification offered/Needs additional education.    Clinical Observations/Individualized Feedback: Due to previous group going over/exceeding time, recreation therapy group was unable to be held at scheduled time.     Plan: Continue to engage patient in RT group sessions 2-3x/week.   Jasmia Angst-McCall, LRT,CTRS 07/31/2023 1:26 PM

## 2023-07-31 NOTE — [facility] Group Notes (Signed)
[facility] Group Notes:  (Nursing/MHT/Case Management/Adjunct)  Date:  07/31/2023  Time:  1:38 AM  Type of Therapy:   Wrap-up group  Participation Level:  Active  Participation Quality:  Appropriate  Affect:  Appropriate  Cognitive:  Appropriate  Insight:  Appropriate  Engagement in Group:  Engaged  Modes of Intervention:  Education  Summary of Progress/Problems: Goal to look for a job. Rated day 5 to 9 out of 10.  Brian Nolan 07/31/2023, 1:38 AM

## 2023-07-31 NOTE — Progress Notes (Signed)
[facility] MD Progress Note  07/31/2023 11:54 AM Brian Nolan  MRN:  [id number]  Principal Problem: <principal problem not specified> Diagnosis: Active Problems:   MDD (major depressive disorder), recurrent severe, without psychosis (HCC)  Total Time spent with patient: 20 minutes  Patient is a  42y.o. male who presents to the [facility] unit due to depression and suicidal ideations.  Interval History Patient was seen today for re-evaluation.  Nursing reports no events overnight. The patient has no issues with performing ADLs.  Patient has been medication compliant.    Patient was seen and interviewed by attending psychiatrist. Chart reviewed. Patient discussed during treatment team rounds.  Subjective:  On assessment patient reports "doing good". He reports improved mood, denies feeling depressed, anxious, panicky. He reports good sleep and appetite. He denies any symptoms of psychosis - denies auditory or visual hallucinations, denies feeling paranoid, unsafe, does not express any delusions. He denies thoughts or plans of hurting self or others. He reports no side effects from medications he is getting here. He denies any physical complaints. He denies any symptoms of acute alcohol withdrawal. He is still agreeable with a plan to be discharged to [facility] on Monday morning.  Labs: no new results for review.  Past Psychiatric History: see H&P  Past Medical History:  Past Medical History:  Diagnosis Date   Depression    History of hiatal hernia    Schizophrenia (HCC)    Substance abuse (HCC)     Past Surgical History:  Procedure Laterality Date   NO PAST SURGERIES     Family History:  Family History  Problem Relation Age of Onset   Schizophrenia Maternal Uncle    Diabetes Mellitus II Father    Family Psychiatric  History: see H&P Social History:  Social History   Substance and Sexual Activity  Alcohol Use Yes   Alcohol/week: 6.0 - 24.0 standard drinks of alcohol   Types:  6 - 24 Cans of beer per week   Comment: 12 ppd     Social History   Substance and Sexual Activity  Drug Use Yes   Types: "Crack" cocaine, Marijuana, Cocaine    Social History   Socioeconomic History   Marital status: Single    Spouse name: Not on file   Number of children: Not on file   Years of education: Not on file   Highest education level: Not on file  Occupational History   Not on file  Tobacco Use   Smoking status: Every Day    Current packs/day: 1.00    Average packs/day: 1 pack/day for 14.5 years (14.5 ttl pk-yrs)    Types: Cigarettes    Start date: 01/31/2008    Last attempt to quit: 01/30/2018    Passive exposure: Never   Smokeless tobacco: Never  Vaping Use   Vaping status: Former  Substance and Sexual Activity   Alcohol use: Yes    Alcohol/week: 6.0 - 24.0 standard drinks of alcohol    Types: 6 - 24 Cans of beer per week    Comment: 12 ppd   Drug use: Yes    Types: "Crack" cocaine, Marijuana, Cocaine   Sexual activity: Yes    Birth control/protection: Condom  Other Topics Concern   Not on file  Social History Narrative   Not on file   Social Determinants of Health   Financial Resource Strain: Not on file  Food Insecurity: No Food Insecurity (07/23/2023)   Hunger Vital Sign    Worried About  Running Out of Food in the Last Year: Never true    Ran Out of Food in the Last Year: Never true  Transportation Needs: No Transportation Needs (07/23/2023)   PRAPARE - Administrator, Civil Service (Medical): No    Lack of Transportation (Non-Medical): No  Physical Activity: Not on file  Stress: Not on file  Social Connections: Not on file   Additional Social History:                         Sleep: Fair  Appetite:  Good  Current Medications: Current Facility-Administered Medications  Medication Dose Route Frequency Provider Last Rate Last Admin   acetaminophen (TYLENOL) tablet 650 mg  650 mg Oral Q6H PRN Motley-Mangrum, Jadeka A,  PMHNP       alum & mag hydroxide-simeth (MAALOX/MYLANTA) 200-200-20 MG/5ML suspension 30 mL  30 mL Oral Q4H PRN Motley-Mangrum, Jadeka A, PMHNP   30 mL at 07/27/23 2015   cloNIDine (CATAPRES) tablet 0.1 mg  0.1 mg Oral QID PRN Rex Kras, MD   0.1 mg at 07/28/23 1251   hydrOXYzine (ATARAX) tablet 25 mg  25 mg Oral TID PRN Motley-Mangrum, Jadeka A, PMHNP   25 mg at 07/30/23 2125   magnesium hydroxide (MILK OF MAGNESIA) suspension 30 mL  30 mL Oral Daily PRN Motley-Mangrum, Jadeka A, PMHNP       OLANZapine (ZYPREXA) injection 5 mg  5 mg Intramuscular TID PRN Motley-Mangrum, Jadeka A, PMHNP       pantoprazole (PROTONIX) EC tablet 40 mg  40 mg Oral Daily Massengill, Nathan, MD   40 mg at 07/31/23 0800   sertraline (ZOLOFT) tablet 50 mg  50 mg Oral Daily Rex Kras, MD   50 mg at 07/31/23 0800   traZODone (DESYREL) tablet 50 mg  50 mg Oral QHS PRN Motley-Mangrum, Jadeka A, PMHNP   50 mg at 07/30/23 2125    Lab Results: No results found for this or any previous visit (from the past 48 hour(s)).  Blood Alcohol level:  Lab Results  Component Value Date   ETH <10 07/23/2023   ETH <10 05/31/2023    Metabolic Disorder Labs: Lab Results  Component Value Date   HGBA1C 5.7 (H) 05/31/2023   MPG 116.89 05/31/2023   MPG 99.67 11/16/2021   No results found for: "PROLACTIN" Lab Results  Component Value Date   CHOL 185 06/02/2023   TRIG 443 (H) 06/02/2023   HDL 41 06/02/2023   CHOLHDL 4.5 06/02/2023   VLDL UNABLE TO CALCULATE IF TRIGLYCERIDE OVER 400 mg/dL 19/14/7829   LDLCALC UNABLE TO CALCULATE IF TRIGLYCERIDE OVER 400 mg/dL 56/21/3086   LDLCALC 89 11/16/2021    Physical Findings: AIMS:  , ,  ,  ,    CIWA:    COWS:     Musculoskeletal: Strength & Muscle Tone: within normal limits Gait & Station: normal Patient leans: N/A  Psychiatric Specialty Exam:  Appearance:  AAM, appearing stated age,  wearing appropriate to the situation casual/hospital clothes, with fair grooming  and hygiene. Normal level of alertness and appropriate facial expression.  Attitude/Behavior: calm, cooperative, engaging with appropriate eye contact.  Motor: WNL; dyskinesias not evident. Gait appears in full range.  Speech: spontaneous, clear, coherent, normal comprehension.  Mood: euthymic, " doing good ".  Affect: appropriately-reactive, full range.  Thought process: patient appears coherent, organized, logical, goal-directed, associations are appropriate. ] Thought content: patient denies suicidal thoughts, denies homicidal thoughts; did not express any  delusions.  Thought perception: patient denies auditory and visual hallucinations. Did not appear internally stimulated.  Cognition: patient is alert and oriented in self, place, date; with intact attention and concentration.  Insight: good, in regards of understanding of presence, nature, cause, and significance of mental or emotional problem.  Judgement: good, in regards of ability to make good decisions concerning the appropriate thing to do in various situations, including ability to form opinions regarding their mental health condition.  Assets  Assets: Manufacturing systems engineer; Desire for Improvement   Sleep  Sleep: Sleep: Good Number of Hours of Sleep: 7.5    Physical Exam: Physical Exam ROS Blood pressure (!) 133/92, pulse 90, temperature 98.2 F (36.8 C), temperature source Oral, resp. rate 18, height 5\' 9"  (1.753 m), weight 107.5 kg, SpO2 100%. Body mass index is 35 kg/m.   Treatment Plan Summary: Daily contact with patient to assess and evaluate symptoms and progress in treatment and Medication management  Patient is a 42 year old male with the above-stated past psychiatric history who is seen in follow-up.  Chart reviewed. Patient discussed with nursing. Patient reports improvement of mood and denies suicidal ideations. He is interested in addiction treatment program. NO medication changes were made  today.  Diagnoses/ Active problems: Major depression recurrent with suicidal ideations Substance use disorder   PLAN:  Safety and Monitoring: continue inpatient psych admission; 15-minute checks; daily contact with patient to assess and evaluate symptoms and progress in treatment; psychoeducation.Vital signs: q12 hours. Precautions: suicide, elopement, and assault. Placed on room lock out for meals, snacks and groups.  Psychiatric Problems: - Continue Zoloft 50mg  po daily for depression, anxiety. -continue Trazodone 50mg  PO at bedtime PRN for insomnia.  - Encouraged patient to participate in unit milieu and in scheduled group therapies   Medical Problems: Tobacco Use Disorder             -- Nicotine patch 21mg /24 hours ordered             -- Smoking cessation encouraged  PRN medications:  acetaminophen, alum & mag hydroxide-simeth, cloNIDine, hydrOXYzine, magnesium hydroxide, OLANZapine, traZODone  Pertinent Labs: no new labs ordered today  Consults: No new consults placed since yesterday    Discharge Planning: -Social work and case management to assist with discharge planning and identification of hospital follow-up needs prior to discharge -Estimated LOS: 3 days. [facility]  -Discharge Concerns: Need to establish a safety plan; Medication compliance and effectiveness -Discharge Goals: Return home with outpatient referrals for mental health follow-up including medication management/psychotherapy  Total Time Spent in Direct Patient Care:  I personally spent 35 minutes on the unit in direct patient care. The direct patient care time included face-to-face time with the patient, reviewing the patient's chart, communicating with other professionals, and coordinating care. Greater than 50% of this time was spent in counseling or coordinating care with the patient regarding goals of hospitalization, psycho-education, and discharge planning needs.    Thalia Party, MD 07/31/2023, 11:54 AM

## 2023-07-31 NOTE — Plan of Care (Signed)
  Problem: Education: Goal: Emotional status will improve Outcome: Progressing Goal: Mental status will improve Outcome: Progressing   Problem: Activity: Goal: Interest or engagement in activities will improve Outcome: Progressing Goal: Sleeping patterns will improve Outcome: Progressing

## 2023-08-01 DIAGNOSIS — F332 Major depressive disorder, recurrent severe without psychotic features: Secondary | ICD-10-CM | POA: Diagnosis not present

## 2023-08-01 MED ORDER — NICOTINE POLACRILEX 2 MG MT GUM
2.0000 mg | CHEWING_GUM | OROMUCOSAL | Status: DC | PRN
  Administered 2023-08-02: 2 mg via ORAL
  Filled 2023-08-01: qty 1

## 2023-08-01 NOTE — Progress Notes (Signed)
   08/01/23 1000  Psych Admission Type (Psych Patients Only)  Admission Status Voluntary  Psychosocial Assessment  Patient Complaints Depression  Eye Contact Fair  Facial Expression Animated  Affect Appropriate to circumstance  Speech Logical/coherent  Interaction Assertive  Motor Activity Slow  Appearance/Hygiene Unremarkable  Behavior Characteristics Appropriate to situation  Mood Pleasant  Thought Process  Coherency WDL  Content WDL  Delusions WDL  Perception WDL  Hallucination None reported or observed  Judgment WDL  Confusion WDL  Danger to Self  Current suicidal ideation? Denies  Danger to Others  Danger to Others None reported or observed

## 2023-08-01 NOTE — Group Note (Unsigned)
Date:  08/02/2023 Time:  12:19 AM  Group Topic/Focus:  Wrap-Up Group:   The focus of this group is to help patients review their daily goal of treatment and discuss progress on daily workbooks.    Participation Level:  Active  Participation Quality:  Appropriate and Sharing  Affect:  Appropriate  Cognitive:  Appropriate  Insight: Appropriate and Limited  Engagement in Group:  Engaged  Modes of Intervention:  Activity and Socialization  Additional Comments:  Patient stated that he is doing "good" and stated that he had a "good day"/ Patient stated that he interacted with others on the unit throughout the day. Patient was limited and did not share much. Patient rated his day a 8/10. Patient participated in activity after sharing.   Kennieth Francois 08/02/2023, 12:19 AM

## 2023-08-01 NOTE — Plan of Care (Signed)
°  Problem: Education: °Goal: Emotional status will improve °Outcome: Progressing °Goal: Mental status will improve °Outcome: Progressing °Goal: Verbalization of understanding the information provided will improve °Outcome: Progressing °  °

## 2023-08-01 NOTE — Group Note (Signed)
LCSW Group Therapy Note  Group Date: 08/01/2023 Start Time: 1000 End Time: 1100   Type of Therapy and Topic:  Group Therapy - Healthy vs Unhealthy Coping Skills  Participation Level:  Active   Description of Group The focus of this group was to determine what unhealthy coping techniques typically are used by group members and what healthy coping techniques would be helpful in coping with various problems. Patients were guided in becoming aware of the differences between healthy and unhealthy coping techniques. Patients were asked to identify 2-3 healthy coping skills they would like to learn to use more effectively.  Therapeutic Goals Patients learned that coping is what human beings do all day long to deal with various situations in their lives Patients defined and discussed healthy vs unhealthy coping techniques Patients identified their preferred coping techniques and identified whether these were healthy or unhealthy Patients determined 2-3 healthy coping skills they would like to become more familiar with and use more often. Patients provided support and ideas to each other   Summary of Patient Progress:  During group, Tyke expressed self-medicating is unhealthy for him while reading the Bible is healthy. Patient proved open to input from peers and feedback from CSW. Patient demonstrated good insight into the subject matter, was respectful of peers, and participated throughout the entire session.   Therapeutic Modalities Cognitive Behavioral Therapy Motivational Interviewing  Burnard Bunting 08/01/2023  11:19 AM

## 2023-08-01 NOTE — Progress Notes (Signed)
   07/31/23 2021  Psych Admission Type (Psych Patients Only)  Admission Status Voluntary  Psychosocial Assessment  Patient Complaints None  Eye Contact Fair  Facial Expression Animated  Affect Appropriate to circumstance  Speech Logical/coherent  Interaction Assertive  Motor Activity Other (Comment) (WDL)  Appearance/Hygiene Unremarkable  Behavior Characteristics Cooperative;Appropriate to situation  Mood Pleasant  Thought Process  Coherency WDL  Content WDL  Delusions WDL  Perception WDL  Hallucination None reported or observed  Judgment WDL  Confusion WDL  Danger to Self  Current suicidal ideation? Denies  Agreement Not to Harm Self Yes  Description of Agreement verbal  Danger to Others  Danger to Others None reported or observed

## 2023-08-01 NOTE — Progress Notes (Signed)
[facility] MD Progress Note  08/01/2023 8:17 AM Jonny Ruiz Vincient Schirra  MRN:  [id number]  Principal Problem: <principal problem not specified> Diagnosis: Active Problems:   MDD (major depressive disorder), recurrent severe, without psychosis (HCC)  Total Time spent with patient: 20 minutes  Patient is a  42y.o. male who presents to the [facility] unit due to depression and suicidal ideations.  Interval History Patient was seen today for re-evaluation.  Nursing reports no events overnight. The patient has no issues with performing ADLs.  Patient has been medication compliant.    Patient was seen and interviewed by attending psychiatrist. Chart reviewed. Patient discussed during treatment team rounds.  Subjective:  On assessment patient reports "doing good still, no complaints". He is asking "can I get a paper that Zoloft does not affect my work?". He reports improved mood, denies feeling depressed, anxious, panicky. He reports good sleep and appetite. He denies auditory or visual hallucinations, denies feeling paranoid, unsafe, does not express any delusions. He denies thoughts or plans of hurting self or others. He reports no side effects from medications he is getting here. He denies any physical complaints. He denies any symptoms of acute alcohol withdrawal. He is still agreeable with a plan to be discharged to [facility] on Monday morning.  Labs: no new results for review.  Past Psychiatric History: see H&P  Past Medical History:  Past Medical History:  Diagnosis Date   Depression    History of hiatal hernia    Schizophrenia (HCC)    Substance abuse (HCC)     Past Surgical History:  Procedure Laterality Date   NO PAST SURGERIES     Family History:  Family History  Problem Relation Age of Onset   Schizophrenia Maternal Uncle    Diabetes Mellitus II Father    Family Psychiatric  History: see H&P Social History:  Social History   Substance and Sexual Activity  Alcohol Use Yes    Alcohol/week: 6.0 - 24.0 standard drinks of alcohol   Types: 6 - 24 Cans of beer per week   Comment: 12 ppd     Social History   Substance and Sexual Activity  Drug Use Yes   Types: "Crack" cocaine, Marijuana, Cocaine    Social History   Socioeconomic History   Marital status: Single    Spouse name: Not on file   Number of children: Not on file   Years of education: Not on file   Highest education level: Not on file  Occupational History   Not on file  Tobacco Use   Smoking status: Every Day    Current packs/day: 1.00    Average packs/day: 1 pack/day for 14.5 years (14.5 ttl pk-yrs)    Types: Cigarettes    Start date: 01/31/2008    Last attempt to quit: 01/30/2018    Passive exposure: Never   Smokeless tobacco: Never  Vaping Use   Vaping status: Former  Substance and Sexual Activity   Alcohol use: Yes    Alcohol/week: 6.0 - 24.0 standard drinks of alcohol    Types: 6 - 24 Cans of beer per week    Comment: 12 ppd   Drug use: Yes    Types: "Crack" cocaine, Marijuana, Cocaine   Sexual activity: Yes    Birth control/protection: Condom  Other Topics Concern   Not on file  Social History Narrative   Not on file   Social Determinants of Health   Financial Resource Strain: Not on file  Food Insecurity: No Food  Insecurity (07/23/2023)   Hunger Vital Sign    Worried About Running Out of Food in the Last Year: Never true    Ran Out of Food in the Last Year: Never true  Transportation Needs: No Transportation Needs (07/23/2023)   PRAPARE - Administrator, Civil Service (Medical): No    Lack of Transportation (Non-Medical): No  Physical Activity: Not on file  Stress: Not on file  Social Connections: Not on file   Additional Social History:                         Sleep: Fair  Appetite:  Good  Current Medications: Current Facility-Administered Medications  Medication Dose Route Frequency Provider Last Rate Last Admin   acetaminophen (TYLENOL)  tablet 650 mg  650 mg Oral Q6H PRN Motley-Mangrum, Jadeka A, PMHNP       alum & mag hydroxide-simeth (MAALOX/MYLANTA) 200-200-20 MG/5ML suspension 30 mL  30 mL Oral Q4H PRN Motley-Mangrum, Jadeka A, PMHNP   30 mL at 07/27/23 2015   cloNIDine (CATAPRES) tablet 0.1 mg  0.1 mg Oral QID PRN Rex Kras, MD   0.1 mg at 07/28/23 1251   hydrOXYzine (ATARAX) tablet 25 mg  25 mg Oral TID PRN Motley-Mangrum, Jadeka A, PMHNP   25 mg at 07/31/23 2117   magnesium hydroxide (MILK OF MAGNESIA) suspension 30 mL  30 mL Oral Daily PRN Motley-Mangrum, Jadeka A, PMHNP       OLANZapine (ZYPREXA) injection 5 mg  5 mg Intramuscular TID PRN Motley-Mangrum, Jadeka A, PMHNP       pantoprazole (PROTONIX) EC tablet 40 mg  40 mg Oral Daily Massengill, Nathan, MD   40 mg at 08/01/23 0754   sertraline (ZOLOFT) tablet 50 mg  50 mg Oral Daily Rex Kras, MD   50 mg at 08/01/23 0754   traZODone (DESYREL) tablet 50 mg  50 mg Oral QHS PRN Motley-Mangrum, Jadeka A, PMHNP   50 mg at 07/31/23 2117    Lab Results: No results found for this or any previous visit (from the past 48 hour(s)).  Blood Alcohol level:  Lab Results  Component Value Date   ETH <10 07/23/2023   ETH <10 05/31/2023    Metabolic Disorder Labs: Lab Results  Component Value Date   HGBA1C 5.7 (H) 05/31/2023   MPG 116.89 05/31/2023   MPG 99.67 11/16/2021   No results found for: "PROLACTIN" Lab Results  Component Value Date   CHOL 185 06/02/2023   TRIG 443 (H) 06/02/2023   HDL 41 06/02/2023   CHOLHDL 4.5 06/02/2023   VLDL UNABLE TO CALCULATE IF TRIGLYCERIDE OVER 400 mg/dL 82/95/6213   LDLCALC UNABLE TO CALCULATE IF TRIGLYCERIDE OVER 400 mg/dL 08/65/7846   LDLCALC 89 11/16/2021    Physical Findings: AIMS:  , ,  ,  ,    CIWA:    COWS:     Musculoskeletal: Strength & Muscle Tone: within normal limits Gait & Station: normal Patient leans: N/A  Psychiatric Specialty Exam:  Appearance:  AAM, appearing stated age,  wearing appropriate  to the situation casual/hospital clothes, with fair grooming and hygiene. Normal level of alertness and appropriate facial expression.  Attitude/Behavior: calm, cooperative, engaging with appropriate eye contact.  Motor: WNL; dyskinesias not evident. Gait appears in full range.  Speech: spontaneous, clear, coherent, normal comprehension.  Mood: euthymic, "good ".  Affect: appropriately-reactive, full range.  Thought process: patient appears coherent, organized, logical, goal-directed, associations are appropriate.  Thought content: patient  denies suicidal thoughts, denies homicidal thoughts; did not express any delusions.  Thought perception: patient denies auditory and visual hallucinations. Did not appear internally stimulated.  Cognition: patient is alert and oriented in self, place, date; with intact attention and concentration.  Insight: good, in regards of understanding of presence, nature, cause, and significance of mental or emotional problem.  Judgement: good, in regards of ability to make good decisions concerning the appropriate thing to do in various situations, including ability to form opinions regarding their mental health condition.  Assets  Assets: Manufacturing systems engineer; Desire for Improvement   Sleep  Sleep: No data recorded    Physical Exam: Physical Exam ROS Blood pressure (!) 130/97, pulse (!) 104, temperature 98.2 F (36.8 C), temperature source Oral, resp. rate 18, height 5\' 9"  (1.753 m), weight 107.5 kg, SpO2 100%. Body mass index is 35 kg/m.   Treatment Plan Summary: Daily contact with patient to assess and evaluate symptoms and progress in treatment and Medication management  Patient is a 42 year old male with the above-stated past psychiatric history who is seen in follow-up.  Chart reviewed. Patient discussed with nursing. Patient reports improvement of mood and denies suicidal ideations. He is interested in addiction treatment program. NO  medication changes were made today.  Diagnoses/ Active problems: Major depression recurrent with suicidal ideations Substance use disorder   PLAN:  Safety and Monitoring: continue inpatient psych admission; 15-minute checks; daily contact with patient to assess and evaluate symptoms and progress in treatment; psychoeducation.Vital signs: q12 hours. Precautions: suicide, elopement, and assault. Placed on room lock out for meals, snacks and groups.  Psychiatric Problems: - Continue Zoloft 50mg  po daily for depression, anxiety. - continue Trazodone 50mg  PO at bedtime PRN for insomnia.  - Encouraged patient to participate in unit milieu and in scheduled group therapies   Medical Problems: Tobacco Use Disorder             -- Nicotine patch 21mg /24 hours ordered             -- Smoking cessation encouraged  PRN medications:  acetaminophen, alum & mag hydroxide-simeth, cloNIDine, hydrOXYzine, magnesium hydroxide, OLANZapine, traZODone  Pertinent Labs: no new labs ordered today  Consults: No new consults placed since yesterday    Discharge Planning: -Social work and case management to assist with discharge planning and identification of hospital follow-up needs prior to discharge -Estimated LOS: 2 days. [facility]  -Discharge Concerns: Need to establish a safety plan; Medication compliance and effectiveness -Discharge Goals: Return home with outpatient referrals for mental health follow-up including medication management/psychotherapy  Total Time Spent in Direct Patient Care:  I personally spent 35 minutes on the unit in direct patient care. The direct patient care time included face-to-face time with the patient, reviewing the patient's chart, communicating with other professionals, and coordinating care. Greater than 50% of this time was spent in counseling or coordinating care with the patient regarding goals of hospitalization, psycho-education,  and discharge planning needs.   Thalia Party, MD 08/01/2023, 8:17 AM

## 2023-08-01 NOTE — Progress Notes (Signed)
Writer informed patient of the reason for clonidine and after discussing his blood pressure he declined the medication. He denies feeling light-headed or dizzy.  08/01/23 2130  Psych Admission Type (Psych Patients Only)  Admission Status Voluntary  Psychosocial Assessment  Patient Complaints Depression  Eye Contact Fair  Facial Expression Animated  Affect Appropriate to circumstance  Speech Logical/coherent  Interaction Assertive  Motor Activity Slow  Appearance/Hygiene Unremarkable  Behavior Characteristics Appropriate to situation  Mood Pleasant  Thought Process  Coherency WDL  Content WDL  Delusions None reported or observed  Perception WDL  Hallucination None reported or observed  Judgment WDL  Confusion WDL  Danger to Self  Current suicidal ideation? Denies

## 2023-08-01 NOTE — [facility] Group Notes (Signed)
[facility] Group Notes:  (Nursing/MHT/Case Management/Adjunct)  Date:  08/01/2023  Time:  2:37 PM  Type of Therapy:  Psychoeducational Skills  Participation Level:  Active  Participation Quality:  Appropriate  Affect:  Appropriate  Cognitive:  Alert and Appropriate  Insight:  Appropriate  Engagement in Group:  Engaged  Modes of Intervention:  Discussion, Education, and Exploration  Summary of Progress/Problems: Pts were educated on the impact of negative thinking, positive reframing and the power of mindfulness. Pts were allowed to discuss one negative thought/habit or coping skill they would like to change to impact their mental health. Pt attended and was appropriate.   Malva Limes 08/01/2023, 2:37 PM

## 2023-08-02 DIAGNOSIS — F332 Major depressive disorder, recurrent severe without psychotic features: Secondary | ICD-10-CM | POA: Diagnosis not present

## 2023-08-02 MED ORDER — SERTRALINE HCL 50 MG PO TABS: 50.0000 mg | ORAL_TABLET | Freq: Every day | ORAL | 0 refills | Status: DC

## 2023-08-02 NOTE — Progress Notes (Signed)
Pt did not attend goals group. 

## 2023-08-02 NOTE — [facility] Suicide Risk Assessment (Signed)
[facility] Discharge Suicide Risk Assessment   Principal Problem: <principal problem not specified> Discharge Diagnoses: Active Problems:   MDD (major depressive disorder), recurrent severe, without psychosis (HCC)   Total Time spent with patient: 30 minutes   Psychiatric Specialty Exam Appearance:  AAM, appearing stated age,  wearing appropriate to the situation casual/hospital clothes, with fair grooming and hygiene. Normal level of alertness and appropriate facial expression.   Attitude/Behavior: calm, cooperative, engaging with appropriate eye contact.   Motor: WNL; dyskinesias not evident. Gait appears in full range.   Speech: spontaneous, clear, coherent, normal comprehension.   Mood: euthymic, "good ".   Affect: appropriately-reactive, full range.   Thought process: patient appears coherent, organized, logical, goal-directed, associations are appropriate.   Thought content: patient denies suicidal thoughts, denies homicidal thoughts; did not express any delusions.   Thought perception: patient denies auditory and visual hallucinations. Did not appear internally stimulated.   Cognition: patient is alert and oriented in self, place, date; with intact attention and concentration.   Insight: good, in regards of understanding of presence, nature, cause, and significance of mental or emotional problem.   Judgement: good, in regards of ability to make good decisions concerning the appropriate thing to do in various situations, including ability to form opinions regarding their mental health condition.  Assets  Assets: Manufacturing systems engineer; Desire for Improvement   Sleep  Sleep:No data recorded  Physical Exam: Physical Exam ROS Blood pressure 123/84, pulse 99, temperature 98 F (36.7 C), temperature source Oral, resp. rate 18, height 5\' 9"  (1.753 m), weight 107.5 kg, SpO2 100%. Body mass index is 35 kg/m.  Mental Status Per Nursing Assessment::   On Admission:  Self-harm  behaviors, Suicidal ideation indicated by patient  Demographic Factors:  Male  Loss Factors: NA  Historical Factors: NA  Risk Reduction Factors:   Employed, Positive social support, Positive therapeutic relationship, and Positive coping skills or problem solving skills  Continued Clinical Symptoms:  Alcohol/Substance Abuse/Dependencies  Cognitive Features That Contribute To Risk:  None    Suicide Risk:  Minimal: No identifiable suicidal ideation.  Patients presenting with no risk factors but with morbid ruminations; may be classified as minimal risk based on the severity of the depressive symptoms    [place] Suicide Severity Rating Scale  Wish to be dead: No  Suicidal thoughts: No                  Suicidal thoughts with method: No                  Suicidal intent: No                  Suicide intent with specific plan: No                  Suicide behavior: No     Follow-up Information     [facility]. Go to.   Specialty: Behavioral Health Why: Please go to this provider for an assessment, to obtain therapy and medication management services. For fastest service, please go on Monday through Friday, arrive by 7:00 am for same day service. Contact information: [address] [place] [address] [phone]        [place] Follow up.   Specialty: Professional Counselor Why: Please go to this provider for therapy service during walk in hours for new patients: Monday through Friday, from 9 am to 1 pm. Contact information: [address] [place]  [place] [address] [phone]         [facility], [facility] Follow up.   Why: Referral made Contact information: [address] [place] [address] [phone]                 Plan Of Care/Follow-up recommendations:  Emergency Contact Plan: Patient understands to call Suicide Hotline at 40, or Mobile Crisis at  [phone], call 911, or go to the nearest ER as appropriate in case of thoughts of harm to self or others, or acute onset of intolerable side effects.   Thalia Party, MD 08/02/2023, 3:01 PM

## 2023-08-02 NOTE — Progress Notes (Signed)
  [facility] Adult Case Management Discharge Plan :  Will you be returning to the same living situation after discharge:  No. [facility] At discharge, do you have transportation home?: No. Do you have the ability to pay for your medications: Yes,  has insurance  Release of information consent forms completed and in the chart;  Patient's signature needed at discharge.  Patient to Follow up at:  Follow-up Information     [facility]. Go to.   Specialty: Behavioral Health Why: Please go to this provider for an assessment, to obtain therapy and medication management services. For fastest service, please go on Monday through Friday, arrive by 7:00 am for same day service. Contact information: [address] [place] [address] [phone]        [place] Follow up.   Specialty: Professional Counselor Why: Please go to this provider for therapy service during walk in hours for new patients: Monday through Friday, from 9 am to 1 pm. Contact information: [address] [place] [address] [phone]         [facility] Follow up.   Why: Referral made Contact information: [address] [place] [address] [phone]                 Next level of care provider has access to [facility] Link:no  Safety Planning and Suicide Prevention discussed: Yes,  completed with patient as he declined consents     Has patient been referred to the Quitline?: Patient refused referral for treatment  Patient has been referred for addiction treatment: admitted into [facility] on [facility]/23 at [facility], LCSW 08/02/2023, 2:57 PM

## 2023-08-02 NOTE — Discharge Summary (Addendum)
Physician Discharge Summary Note  Patient:  Brian Nolan is an 42 y.o., male MRN:  [id number] DOB:  01/31/1981 Patient phone:  [phone] (home)  Patient address:   [address] [place] [address],  Total Time spent with patient: 45 minutes  Date of Admission:  07/23/2023 Date of Discharge: 08/03/2023  Reason for Admission:  depression and suicidal ideations   Principal Problem: <principal problem not specified> Discharge Diagnoses: Active Problems:   MDD (major depressive disorder), recurrent severe, without psychosis (HCC)   Past Psychiatric History:  MDD, GAD, and Polysubstance Abuse (EtOH, Cocaine), and Prior Suicide Attempt (10/2021-held gun to head and pulled trigger), a remote history of Self Injurious Behavior ([place]-last 2014), and Multiple Prior Psychiatric Hospitalizations (last- [facility] 03/2022).   Past Medical History:  Past Medical History:  Diagnosis Date   Depression    History of hiatal hernia    Schizophrenia (HCC)    Substance abuse (HCC)     Past Surgical History:  Procedure Laterality Date   NO PAST SURGERIES     Family History:  Family History  Problem Relation Age of Onset   Schizophrenia Maternal Uncle    Diabetes Mellitus II Father    Family Psychiatric  History:  Mother- EtOH Abuse Father- Polysubstance Abuse Maternal Uncle- Schizophrenia, Bipolar Disorder, Polysubstance Abuse No Known Suicides   Social History:  Social History   Substance and Sexual Activity  Alcohol Use Yes   Alcohol/week: 6.0 - 24.0 standard drinks of alcohol   Types: 6 - 24 Cans of beer per week   Comment: 12 ppd     Social History   Substance and Sexual Activity  Drug Use Yes   Types: "Crack" cocaine, Marijuana, Cocaine    Social History   Socioeconomic History   Marital status: Single    Spouse name: Not on file   Number of children: Not on file   Years of education: Not on file   Highest education level:  Not on file  Occupational History   Not on file  Tobacco Use   Smoking status: Every Day    Current packs/day: 1.00    Average packs/day: 1 pack/day for 14.5 years (14.5 ttl pk-yrs)    Types: Cigarettes    Start date: 01/31/2008    Last attempt to quit: 01/30/2018    Passive exposure: Never   Smokeless tobacco: Never  Vaping Use   Vaping status: Former  Substance and Sexual Activity   Alcohol use: Yes    Alcohol/week: 6.0 - 24.0 standard drinks of alcohol    Types: 6 - 24 Cans of beer per week    Comment: 12 ppd   Drug use: Yes    Types: "Crack" cocaine, Marijuana, Cocaine   Sexual activity: Yes    Birth control/protection: Condom  Other Topics Concern   Not on file  Social History Narrative   Not on file   Social Determinants of Health   Financial Resource Strain: Not on file  Food Insecurity: No Food Insecurity (07/23/2023)   Hunger Vital Sign    Worried About Running Out of Food in the Last Year: Never true    Ran Out of Food in the Last Year: Never true  Transportation Needs: No Transportation Needs (07/23/2023)   PRAPARE - Administrator, Civil Service (Medical): No    Lack of Transportation (Non-Medical): No  Physical Activity: Not on file  Stress: Not on file  Social Connections: Not on  file    Hospital Course:    The patient was admitted to the inpatient psychiatric unit due to symptoms of depression, suicidal ideation, and anxiety. He was restricted to ward and placed on suicide precautions. Patient was introduced to milieu activities and encouraged to participate in psycho-social groups. The plan at the time of admission was safety, stabilization and treatment.  Patient was treated with a combination of medication, therapy, and some educational activities during her hospital stay. He participated in individual and group therapy sessions, and attended various educational groups and participated in activities to help her manage his emotional and mental  health. For the management of depressive disorder, patient was started on Zoloft (Sertraline) 50 mg p.o. daily; Trazodone was continued as needed nightly to assist with his insomnia, and Hydroxyzine used to help with anxiety "as needed". Patient never required as needed medications for agitation or psychosis.  At the time of discharge, patient was able to manage his symptoms of depression, anxiety, and suicidal ideation. He was able to identify his triggers and develop coping skills to help his manage her emotions. He was also able to develop positive relationships with staff and peers on the unit. Patient is being [facility] with a follow-up appointment/referral to an outpatient mental health provider. Patient also has access to a 24-hour crisis hotline and other community resources should she need additional support. Patient has been instructed to take her medications as prescribed, continue participating in therapy, and practice healthy lifestyle habits such as getting enough sleep and exercise. He was also reminded to reach out to his support system and the community resources available to him.  Discharge instructions:  Activity: as tolerated  Diet: heart healthy  # It is recommended to the patient to continue psychiatric medications as prescribed, after discharge from the hospital.     # It is recommended to the patient to follow up with your outpatient psychiatric provider -instructions on appointment date, time, and address (location) are provided to you in discharge paperwork  # It was discussed with the patient, the impact of alcohol, drugs, tobacco have been there overall psychiatric and medical wellbeing, and total abstinence from substance use was recommended to the patient.   # Prescriptions provided or sent directly to preferred pharmacy at discharge. Patient agreeable to plan. Given opportunity to ask questions. Appears to feel comfortable with  discharge.    # In the event of worsening symptoms, the patient is instructed to call the crisis hotline, [phone], and or go to the nearest ED for appropriate evaluation and treatment of symptoms. To follow-up with primary care provider for other medical issues, concerns and or health care needs.  Physical Findings: AIMS:  , ,  ,  ,    CIWA:    COWS:     Musculoskeletal: Strength & Muscle Tone: within normal limits Gait & Station: normal Patient leans: N/A   Psychiatric Specialty Exam:  Appearance:  AAM, appearing stated age,  wearing appropriate to the situation casual/hospital clothes, with fair grooming and hygiene. Normal level of alertness and appropriate facial expression.   Attitude/Behavior: calm, cooperative, engaging with appropriate eye contact.   Motor: WNL; dyskinesias not evident. Gait appears in full range.   Speech: spontaneous, clear, coherent, normal comprehension.   Mood: euthymic, "good ".   Affect: appropriately-reactive, full range.   Thought process: patient appears coherent, organized, logical, goal-directed, associations are appropriate.   Thought content: patient denies suicidal thoughts, denies homicidal thoughts; did not  express any delusions.   Thought perception: patient denies auditory and visual hallucinations. Did not appear internally stimulated.   Cognition: patient is alert and oriented in self, place, date; with intact attention and concentration.   Insight: good, in regards of understanding of presence, nature, cause, and significance of mental or emotional problem.   Judgement: good, in regards of ability to make good decisions concerning the appropriate thing to do in various situations, including ability to form opinions regarding their mental health condition.  Assets  Assets: Manufacturing systems engineer; Desire for Improvement    Physical Exam: Physical Exam ROS Blood pressure 123/84, pulse 99, temperature 98 F (36.7 C),  temperature source Oral, resp. rate 18, height 5\' 9"  (1.753 m), weight 107.5 kg, SpO2 100%. Body mass index is 35 kg/m.   Social History   Tobacco Use  Smoking Status Every Day   Current packs/day: 1.00   Average packs/day: 1 pack/day for 14.5 years (14.5 ttl pk-yrs)   Types: Cigarettes   Start date: 01/31/2008   Last attempt to quit: 01/30/2018   Passive exposure: Never  Smokeless Tobacco Never   Tobacco Cessation:  A prescription for an FDA-approved tobacco cessation medication was offered at discharge and the patient refused   Blood Alcohol level:  Lab Results  Component Value Date   [facility] <10 07/23/2023   ETH <10 05/31/2023    Metabolic Disorder Labs:  Lab Results  Component Value Date   HGBA1C 5.7 (H) 05/31/2023   MPG 116.89 05/31/2023   MPG 99.67 11/16/2021   No results found for: "PROLACTIN" Lab Results  Component Value Date   CHOL 185 06/02/2023   TRIG 443 (H) 06/02/2023   HDL 41 06/02/2023   CHOLHDL 4.5 06/02/2023   VLDL UNABLE TO CALCULATE IF TRIGLYCERIDE OVER 400 mg/dL 53/66/4403   LDLCALC UNABLE TO CALCULATE IF TRIGLYCERIDE OVER 400 mg/dL 47/42/5956   LDLCALC 89 11/16/2021    See Psychiatric Specialty Exam and Suicide Risk Assessment completed by Attending Physician prior to discharge.  Discharge destination:  [facility]  Is patient on multiple antipsychotic therapies at discharge:  No   Has Patient had three or more failed trials of antipsychotic monotherapy by history:  No  Recommended Plan for Multiple Antipsychotic Therapies: NA   Allergies as of 08/02/2023   No Known Allergies      Medication List     STOP taking these medications    QUEtiapine 50 MG tablet Commonly known as: SEROQUEL       TAKE these medications      Indication  hydrOXYzine 25 MG tablet Commonly known as: ATARAX Take 1 tablet (25 mg total) by mouth 3 (three) times daily as needed for anxiety.  Indication: Feeling Anxious   pantoprazole 40 MG  tablet Commonly known as: PROTONIX Take 1 tablet (40 mg total) by mouth daily.  Indication: Heartburn   sertraline 50 MG tablet Commonly known as: ZOLOFT Take 1 tablet (50 mg total) by mouth daily. Start taking on: August 03, 2023 What changed:  medication strength how much to take  Indication: Major Depressive Disorder   traZODone 50 MG tablet Commonly known as: DESYREL Take 1 tablet (50 mg total) by mouth at bedtime as needed for sleep.  Indication: Trouble Sleeping        Follow-up Information     [facility]. Go to.   Specialty: Behavioral Health Why: Please go to this provider for an assessment, to obtain therapy and medication management services. For fastest service,  please go on Monday through Friday, arrive by 7:00 am for same day service. Contact information: [address] [place] [address] [phone]        [place] Follow up.   Specialty: Professional Counselor Why: Please go to this provider for therapy service during walk in hours for new patients: Monday through Friday, from 9 am to 1 pm. Contact information: [address] [place] [address] [phone]         [facility] Follow up.   Why: Referral made Contact information: [address] [place] [address] [phone]                 Follow-up recommendations:  see above  Comments:   Emergency Contact Plan: Patient understands to call Suicide Hotline at 52, or Mobile Crisis at [phone], call 911, or go to the nearest ER as appropriate in case of thoughts of harm to self or others, or acute onset of intolerable side effects.  Signed: Thalia Party, MD 08/02/2023, 2:52 PM

## 2023-08-02 NOTE — Plan of Care (Signed)
  Problem: Education: Goal: Mental status will improve Outcome: Progressing   Problem: Coping: Goal: Ability to demonstrate self-control will improve Outcome: Progressing

## 2023-08-02 NOTE — Plan of Care (Signed)
  Problem: Coping: Goal: Ability to identify and develop effective coping behavior will improve Outcome: Progressing Goal: Ability to interact with others will improve Outcome: Progressing Goal: Demonstration of participation in decision-making regarding own care will improve Outcome: Progressing Goal: Ability to use eye contact when communicating with others will improve Outcome: Progressing   Problem: Health Behavior/Discharge Planning: Goal: Identification of resources available to assist in meeting health care needs will improve Outcome: Progressing   Problem: Self-Concept: Goal: Will verbalize positive feelings about self Outcome: Progressing

## 2023-08-02 NOTE — B[facility] Group Notes (Signed)
[facility] Group Notes:  (Nursing/MHT/Case Management/Adjunct)  Date:  08/02/2023  Time:  9:26 PM  Type of Therapy:  Psychoeducational Skills  Participation Level:  Active  Participation Quality:  Appropriate  Affect:  Appropriate  Cognitive:  Appropriate  Insight:  Appropriate  Engagement in Group:  Developing/Improving  Modes of Intervention:  Education  Summary of Progress/Problems: The patient rated his day as a 10 out of 10 since he received some information about discharge. He also states that he received information about a job but did not go into detail. He also had a good talk with his family.   Hazle Coca S 08/02/2023, 9:26 PM

## 2023-08-02 NOTE — Progress Notes (Signed)
[facility] MD Progress Note  08/02/2023 8:18 AM Brian Nolan  MRN:  [id number]  Principal Problem: <principal problem not specified> Diagnosis: Active Problems:   MDD (major depressive disorder), recurrent severe, without psychosis (HCC)  Total Time spent with patient: 20 minutes  Patient is a  42y.o. male who presents to the [facility] unit due to depression and suicidal ideations.  Interval History Patient was seen today for re-evaluation.  Nursing reports no events overnight. The patient has no issues with performing ADLs.  Patient has been medication compliant.    Patient was seen and interviewed by attending psychiatrist. Chart reviewed. Patient discussed during treatment team rounds.  Subjective:  On assessment patient reports "I am good, nothing to complain about". He reports good mood, denies feeling depressed, anxious, panicky. He reports good sleep and appetite. He denies auditory or visual hallucinations, denies feeling paranoid, unsafe, does not express any delusions. He denies thoughts or plans of hurting self or others. He reports no side effects from medications he is getting here. He denies any physical complaints. He denies any symptoms of acute alcohol withdrawal. He is still agreeable with a plan to be discharged to [facility] tomorrow.  Labs: no new results for review.  Past Psychiatric History: see H&P  Past Medical History:  Past Medical History:  Diagnosis Date   Depression    History of hiatal hernia    Schizophrenia (HCC)    Substance abuse (HCC)     Past Surgical History:  Procedure Laterality Date   NO PAST SURGERIES     Family History:  Family History  Problem Relation Age of Onset   Schizophrenia Maternal Uncle    Diabetes Mellitus II Father    Family Psychiatric  History: see H&P Social History:  Social History   Substance and Sexual Activity  Alcohol Use Yes   Alcohol/week: 6.0 - 24.0 standard drinks of alcohol   Types: 6 - 24 Cans of beer per  week   Comment: 12 ppd     Social History   Substance and Sexual Activity  Drug Use Yes   Types: "Crack" cocaine, Marijuana, Cocaine    Social History   Socioeconomic History   Marital status: Single    Spouse name: Not on file   Number of children: Not on file   Years of education: Not on file   Highest education level: Not on file  Occupational History   Not on file  Tobacco Use   Smoking status: Every Day    Current packs/day: 1.00    Average packs/day: 1 pack/day for 14.5 years (14.5 ttl pk-yrs)    Types: Cigarettes    Start date: 01/31/2008    Last attempt to quit: 01/30/2018    Passive exposure: Never   Smokeless tobacco: Never  Vaping Use   Vaping status: Former  Substance and Sexual Activity   Alcohol use: Yes    Alcohol/week: 6.0 - 24.0 standard drinks of alcohol    Types: 6 - 24 Cans of beer per week    Comment: 12 ppd   Drug use: Yes    Types: "Crack" cocaine, Marijuana, Cocaine   Sexual activity: Yes    Birth control/protection: Condom  Other Topics Concern   Not on file  Social History Narrative   Not on file   Social Determinants of Health   Financial Resource Strain: Not on file  Food Insecurity: No Food Insecurity (07/23/2023)   Hunger Vital Sign    Worried About Running Out of  Food in the Last Year: Never true    Ran Out of Food in the Last Year: Never true  Transportation Needs: No Transportation Needs (07/23/2023)   PRAPARE - Administrator, Civil Service (Medical): No    Lack of Transportation (Non-Medical): No  Physical Activity: Not on file  Stress: Not on file  Social Connections: Not on file   Additional Social History:                         Sleep: Fair  Appetite:  Good  Current Medications: Current Facility-Administered Medications  Medication Dose Route Frequency Provider Last Rate Last Admin   acetaminophen (TYLENOL) tablet 650 mg  650 mg Oral Q6H PRN Motley-Mangrum, Jadeka A, PMHNP   650 mg at 08/01/23  1801   alum & mag hydroxide-simeth (MAALOX/MYLANTA) 200-200-20 MG/5ML suspension 30 mL  30 mL Oral Q4H PRN Motley-Mangrum, Jadeka A, PMHNP   30 mL at 07/27/23 2015   cloNIDine (CATAPRES) tablet 0.1 mg  0.1 mg Oral QID PRN Rex Kras, MD   0.1 mg at 08/02/23 0646   hydrOXYzine (ATARAX) tablet 25 mg  25 mg Oral TID PRN Motley-Mangrum, Jadeka A, PMHNP   25 mg at 08/01/23 2117   magnesium hydroxide (MILK OF MAGNESIA) suspension 30 mL  30 mL Oral Daily PRN Motley-Mangrum, Jadeka A, PMHNP       nicotine polacrilex (NICORETTE) gum 2 mg  2 mg Oral PRN Abbott Pao, Nadir, MD       OLANZapine (ZYPREXA) injection 5 mg  5 mg Intramuscular TID PRN Motley-Mangrum, Jadeka A, PMHNP       pantoprazole (PROTONIX) EC tablet 40 mg  40 mg Oral Daily Massengill, Nathan, MD   40 mg at 08/01/23 0754   sertraline (ZOLOFT) tablet 50 mg  50 mg Oral Daily Rex Kras, MD   50 mg at 08/01/23 0754   traZODone (DESYREL) tablet 50 mg  50 mg Oral QHS PRN Motley-Mangrum, Jadeka A, PMHNP   50 mg at 08/01/23 2117    Lab Results: No results found for this or any previous visit (from the past 48 hour(s)).  Blood Alcohol level:  Lab Results  Component Value Date   ETH <10 07/23/2023   ETH <10 05/31/2023    Metabolic Disorder Labs: Lab Results  Component Value Date   HGBA1C 5.7 (H) 05/31/2023   MPG 116.89 05/31/2023   MPG 99.67 11/16/2021   No results found for: "PROLACTIN" Lab Results  Component Value Date   CHOL 185 06/02/2023   TRIG 443 (H) 06/02/2023   HDL 41 06/02/2023   CHOLHDL 4.5 06/02/2023   VLDL UNABLE TO CALCULATE IF TRIGLYCERIDE OVER 400 mg/dL 75/64/3329   LDLCALC UNABLE TO CALCULATE IF TRIGLYCERIDE OVER 400 mg/dL 51/88/4166   LDLCALC 89 11/16/2021    Physical Findings: AIMS:  , ,  ,  ,    CIWA:    COWS:     Musculoskeletal: Strength & Muscle Tone: within normal limits Gait & Station: normal Patient leans: N/A  Psychiatric Specialty Exam:  Appearance:  AAM, appearing stated age,   wearing appropriate to the situation casual/hospital clothes, with fair grooming and hygiene. Normal level of alertness and appropriate facial expression.  Attitude/Behavior: calm, cooperative, engaging with appropriate eye contact.  Motor: WNL; dyskinesias not evident. Gait appears in full range.  Speech: spontaneous, clear, coherent, normal comprehension.  Mood: euthymic, "good ".  Affect: appropriately-reactive, full range.  Thought process: patient appears coherent, organized, logical,  goal-directed, associations are appropriate.  Thought content: patient denies suicidal thoughts, denies homicidal thoughts; did not express any delusions.  Thought perception: patient denies auditory and visual hallucinations. Did not appear internally stimulated.  Cognition: patient is alert and oriented in self, place, date; with intact attention and concentration.  Insight: good, in regards of understanding of presence, nature, cause, and significance of mental or emotional problem.  Judgement: good, in regards of ability to make good decisions concerning the appropriate thing to do in various situations, including ability to form opinions regarding their mental health condition.  Assets  Assets: Manufacturing systems engineer; Desire for Improvement   Sleep  Sleep: No data recorded    Physical Exam: Physical Exam ROS Blood pressure (!) 135/98, pulse (!) 106, temperature 98 F (36.7 C), temperature source Oral, resp. rate 18, height 5\' 9"  (1.753 m), weight 107.5 kg, SpO2 100%. Body mass index is 35 kg/m.   Treatment Plan Summary: Daily contact with patient to assess and evaluate symptoms and progress in treatment and Medication management  Patient is a 42 year old male with the above-stated past psychiatric history who is seen in follow-up.  Chart reviewed. Patient discussed with nursing. Patient reports improvement of mood and denies suicidal ideations. He is interested in addiction  treatment program. NO medication changes were made today.  Diagnoses/ Active problems: Major depression recurrent with suicidal ideations Substance use disorder   PLAN:  Safety and Monitoring: continue inpatient psych admission; 15-minute checks; daily contact with patient to assess and evaluate symptoms and progress in treatment; psychoeducation.Vital signs: q12 hours. Precautions: suicide, elopement, and assault. Placed on room lock out for meals, snacks and groups.  Psychiatric Problems: - Continue Zoloft 50mg  po daily for depression, anxiety. - continue Trazodone 50mg  PO at bedtime PRN for insomnia.  - Encouraged patient to participate in unit milieu and in scheduled group therapies   Medical Problems: Tobacco Use Disorder             -- Nicotine patch 21mg /24 hours ordered             -- Smoking cessation encouraged  PRN medications:  acetaminophen, alum & mag hydroxide-simeth, cloNIDine, hydrOXYzine, magnesium hydroxide, nicotine polacrilex, OLANZapine, traZODone  Pertinent Labs: no new labs ordered today  Consults: No new consults placed since yesterday    Discharge Planning: -Social work and case management to assist with discharge planning and identification of hospital follow-up needs prior to discharge -Estimated LOS: 1 day. Discharge on Monday 12/2  to the [facility] program.  -Discharge Concerns: Need to establish a safety plan; Medication compliance and effectiveness -Discharge Goals: Return home with outpatient referrals for mental health follow-up including medication management/psychotherapy  Total Time Spent in Direct Patient Care:  I personally spent 35 minutes on the unit in direct patient care. The direct patient care time included face-to-face time with the patient, reviewing the patient's chart, communicating with other professionals, and coordinating care. Greater than 50% of this time was spent in counseling or coordinating care with the patient regarding  goals of hospitalization, psycho-education, and discharge planning needs.   Thalia Party, MD 08/02/2023, 8:18 AM

## 2023-08-02 NOTE — [facility] Group Notes (Signed)
[facility] Group Notes:  (Nursing/MHT/Case Management/Adjunct)  Date:  08/02/2023  Time:  12:58 PM  Type of Therapy:  Psychoeducational Skills  Participation Level:  did not attend  Participation Quality  Affect:  Cognitive:    Insight:    Engagement in Group:    Modes of Intervention:    Summary of Progress/Problems:  Patients were given education on motivation with a podcast from '' [employer] '' in which the guest was DR. ALOK K who talked about dopamine, and tapping into the power you have to get where you want to go in life. ''  Pt did not attend.   Brian Nolan 08/02/2023, 12:58 PM

## 2023-08-02 NOTE — Plan of Care (Signed)
Nurse discussed anxiety, depression and coping skills with patient.  

## 2023-08-02 NOTE — Progress Notes (Signed)
D:  Patient's self inventory sheet, patient has fair sleep, no sleep medication given.  Good appetite, normal energy level, good concentration.  Denied depression, anxiety, hopeless.  Denied withdrawals.  Denied SI.  Denied physical problems.  Denied physical pain.  Goal is controlling thoughts.  Plans to practice to switch thoughts.  Does have discharge plans. A:  Medications administered per MD orders.  Emotional support and encouragement given patient. R:  Denied SI and HI, contracts for safety.  Denied A/V hallucinations.  Safety maintained with 15 minute checks.

## 2023-08-02 NOTE — Progress Notes (Signed)
  [facility] [address] [place], [place] [address]  08/02/2023  To Whom It May Concern,  Re: Mayra Neer. Karleen Hampshire - Current Medication  I am writing in regard to my patient, Misael A. Karleen Hampshire, who has been prescribed Zoloft (sertraline), a selective serotonin reuptake inhibitor (SSRI), as part of their ongoing treatment plan for patient's condition.  After careful evaluation, I would like to confirm that Zoloft is not contraindicated for individuals performing tasks requiring alertness, such as driving. The medication generally does not impair the ability to operate a motor vehicle. While Zoloft may cause side effects such as dizziness or drowsiness in some individuals, these are not commonly experienced or severe enough to affect most patients' capacity to drive safely. My patient has been closely monitored and does not report any side effects that would interfere with their job duties or their ability to drive.  Based on this information, I do not anticipate any medical concerns that would affect Deloyd's ability to drive while on Zoloft. However, should any new symptoms arise, we will promptly address them and adjust their treatment plan as necessary.  Sincerely,  Thalia Party, MD Psychiatrist

## 2023-08-03 NOTE — Progress Notes (Signed)
Assumed care for pt at about 07:30.  No behavioral problems this am, reports good sleep overnight, vital signs WNL, he denied nay avh/hi/si, he was discharged from facility at about 08:20, via Taxi, to [facility]. Vital signs WNL from this am, he verbalized understanding of discharge instructions and meds. Personal belongings handed over to patient.

## 2023-08-03 NOTE — Transportation (Signed)
08/03/2023  Binyomin Thomes Dinning DOB: [birth year] MRN: [id number]   RIDER WAIVER AND RELEASE OF LIABILITY  For the purposes of helping with transportation needs, [facility] partners with outside transportation providers (taxi companies, [place], Catering manager.) to give [employer] patients or other approved people the choice of on-demand rides [employer]") to our buildings for non-emergency visits.  By using [employer], I, the person signing this document, on behalf of myself and/or any legal minors (in my care using the [employer]), agree:  Science writer given to me are supplied by independent, outside transportation providers who do not work for, or have any affiliation with, [employer]. [place] is not a transportation company. [facility] has no control over the quality or safety of the rides I get using [employer]. [facility] has no control over whether any outside ride will happen on time or not. [facility] gives no guarantee on the reliability, quality, safety, or availability on any rides, or that no mistakes will happen. I know and accept that traveling by vehicle (car, truck, SVU, Zenaida Niece, bus, taxi, etc.) has risks of serious injuries such as disability, being paralyzed, and death. I know and agree the risk of using [employer] is mine alone, and not [employer]. Transport Services are provided "as is" and as are available. The transportation providers are in charge for all inspections and care of the vehicles used to provide these rides. I agree not to take legal action against [place], its agents, employees, officers, directors, representatives, insurers, attorneys, assigns, successors, subsidiaries, and affiliates at any time for any reasons related directly or indirectly to using [employer]. I also agree not to take legal action against  or its affiliates for any injury, death, or damage to property caused by or related  to using [employer]. I have read this Waiver and Release of Liability, and I understand the terms used in it and their legal meaning. This Waiver is freely and voluntarily given with the understanding that my right (or any legal minors) to legal action against  relating to [employer] is knowingly given up to use these services.   I attest that I read the Ride Waiver and Release of Liability to [name], gave Mr. Connally the opportunity to ask questions and answered the questions asked (if any). I affirm that [name] then provided consent for assistance with transportation.

## 2023-08-03 NOTE — Plan of Care (Signed)
  Problem: Education: Goal: Knowledge of [facility] Education information/materials will improve Outcome: Adequate for Discharge Goal: Emotional status will improve Outcome: Adequate for Discharge Goal: Mental status will improve Outcome: Adequate for Discharge Goal: Verbalization of understanding the information provided will improve Outcome: Adequate for Discharge   Problem: Activity: Goal: Interest or engagement in activities will improve Outcome: Adequate for Discharge Goal: Sleeping patterns will improve Outcome: Adequate for Discharge   Problem: Coping: Goal: Ability to verbalize frustrations and anger appropriately will improve Outcome: Adequate for Discharge Goal: Ability to demonstrate self-control will improve Outcome: Adequate for Discharge   Problem: Health Behavior/Discharge Planning: Goal: Identification of resources available to assist in meeting health care needs will improve Outcome: Adequate for Discharge Goal: Compliance with treatment plan for underlying cause of condition will improve Outcome: Adequate for Discharge   Problem: Safety: Goal: Periods of time without injury will increase Outcome: Adequate for Discharge   Problem: Education: Goal: Knowledge of [facility] Education information/materials will improve Outcome: Adequate for Discharge Goal: Emotional status will improve Outcome: Adequate for Discharge Goal: Mental status will improve Outcome: Adequate for Discharge Goal: Verbalization of understanding the information provided will improve Outcome: Adequate for Discharge   Problem: Coping: Goal: Ability to verbalize frustrations and anger appropriately will improve Outcome: Adequate for Discharge Goal: Ability to demonstrate self-control will improve Outcome: Adequate for Discharge   Problem: Health Behavior/Discharge Planning: Goal: Identification of resources available to assist in meeting health care needs will  improve Outcome: Adequate for Discharge Goal: Compliance with treatment plan for underlying cause of condition will improve Outcome: Adequate for Discharge   Problem: Physical Regulation: Goal: Ability to maintain clinical measurements within normal limits will improve Outcome: Adequate for Discharge   Problem: Safety: Goal: Periods of time without injury will increase Outcome: Adequate for Discharge   Problem: Education: Goal: Knowledge of disease or condition will improve Outcome: Adequate for Discharge Goal: Understanding of discharge needs will improve Outcome: Adequate for Discharge   Problem: Physical Regulation: Goal: Complications related to the disease process, condition or treatment will be avoided or minimized Outcome: Adequate for Discharge   Problem: Safety: Goal: Ability to remain free from injury will improve Outcome: Adequate for Discharge   Problem: Self-Concept: Goal: Will verbalize positive feelings about self Outcome: Adequate for Discharge   Problem: Health Behavior/Discharge Planning: Goal: Identification of resources available to assist in meeting health care needs will improve Outcome: Adequate for Discharge   Problem: Coping: Goal: Ability to use eye contact when communicating with others will improve Outcome: Adequate for Discharge   Problem: Coping: Goal: Demonstration of participation in decision-making regarding own care will improve Outcome: Adequate for Discharge   Problem: Coping: Goal: Ability to interact with others will improve Outcome: Adequate for Discharge   Problem: Coping: Goal: Ability to identify and develop effective coping behavior will improve Outcome: Adequate for Discharge Goal: Ability to interact with others will improve Outcome: Adequate for Discharge Goal: Demonstration of participation in decision-making regarding own care will improve Outcome: Adequate for Discharge Goal: Ability to use eye contact when  communicating with others will improve Outcome: Adequate for Discharge

## 2023-08-13 ENCOUNTER — Ambulatory Visit (HOSPITAL_COMMUNITY): Payer: No Payment, Other

## 2023-09-04 ENCOUNTER — Other Ambulatory Visit: Payer: Self-pay

## 2023-09-04 ENCOUNTER — Emergency Department (HOSPITAL_COMMUNITY)
Admission: EM | Admit: 2023-09-04 | Discharge: 2023-09-04 | Disposition: A | Discharge status: Home / Self Care | Payer: MEDICAID | Attending: Emergency Medicine | Admitting: Emergency Medicine

## 2023-09-04 ENCOUNTER — Encounter (HOSPITAL_COMMUNITY): Payer: Self-pay

## 2023-09-04 DIAGNOSIS — R21 Rash and other nonspecific skin eruption: Secondary | ICD-10-CM | POA: Diagnosis not present

## 2023-09-04 DIAGNOSIS — N4889 Other specified disorders of penis: Secondary | ICD-10-CM | POA: Diagnosis present

## 2023-09-04 DIAGNOSIS — R3989 Other symptoms and signs involving the genitourinary system: Secondary | ICD-10-CM

## 2023-09-04 LAB — URINALYSIS, W/ REFLEX TO CULTURE (INFECTION SUSPECTED)
Bacteria, UA: NONE SEEN
Bilirubin Urine: NEGATIVE
Glucose, UA: NEGATIVE mg/dL
Hgb urine dipstick: NEGATIVE
Ketones, ur: NEGATIVE mg/dL
Leukocytes,Ua: NEGATIVE
Nitrite: NEGATIVE
Protein, ur: NEGATIVE mg/dL
Specific Gravity, Urine: 1.02 (ref 1.005–1.030)
pH: 6 (ref 5.0–8.0)

## 2023-09-04 LAB — CBC WITH DIFFERENTIAL/PLATELET
Abs Immature Granulocytes: 0.04 10*3/uL (ref 0.00–0.07)
Basophils Absolute: 0.1 10*3/uL (ref 0.0–0.1)
Basophils Relative: 1 %
Eosinophils Absolute: 0.3 10*3/uL (ref 0.0–0.5)
Eosinophils Relative: 3 %
HCT: 40.1 % (ref 39.0–52.0)
Hemoglobin: 13.7 g/dL (ref 13.0–17.0)
Immature Granulocytes: 0 %
Lymphocytes Relative: 24 %
Lymphs Abs: 2.3 10*3/uL (ref 0.7–4.0)
MCH: 29.5 pg (ref 26.0–34.0)
MCHC: 34.2 g/dL (ref 30.0–36.0)
MCV: 86.2 fL (ref 80.0–100.0)
Monocytes Absolute: 0.9 10*3/uL (ref 0.1–1.0)
Monocytes Relative: 10 %
Neutro Abs: 6 10*3/uL (ref 1.7–7.7)
Neutrophils Relative %: 62 %
Platelets: 222 10*3/uL (ref 150–400)
RBC: 4.65 MIL/uL (ref 4.22–5.81)
RDW: 13 % (ref 11.5–15.5)
WBC: 9.6 10*3/uL (ref 4.0–10.5)
nRBC: 0 % (ref 0.0–0.2)

## 2023-09-04 LAB — COMPREHENSIVE METABOLIC PANEL
ALT: 17 U/L (ref 0–44)
AST: 17 U/L (ref 15–41)
Albumin: 3.3 g/dL — ABNORMAL LOW (ref 3.5–5.0)
Alkaline Phosphatase: 94 U/L (ref 38–126)
Anion gap: 6 (ref 5–15)
BUN: 14 mg/dL (ref 6–20)
CO2: 26 mmol/L (ref 22–32)
Calcium: 8.7 mg/dL — ABNORMAL LOW (ref 8.9–10.3)
Chloride: 104 mmol/L (ref 98–111)
Creatinine, Ser: 0.85 mg/dL (ref 0.61–1.24)
GFR, Estimated: >60 mL/min (ref >60)
Glucose, Bld: 117 mg/dL — ABNORMAL HIGH (ref 70–99)
Potassium: 4.1 mmol/L (ref 3.5–5.1)
Sodium: 136 mmol/L (ref 135–145)
Total Bilirubin: 0.7 mg/dL (ref 0.0–1.2)
Total Protein: 7 g/dL (ref 6.5–8.1)

## 2023-09-04 LAB — HIV ANTIBODY (ROUTINE TESTING W REFLEX): HIV Screen 4th Generation wRfx: NONREACTIVE

## 2023-09-04 MED ORDER — PREDNISONE 20 MG PO TABS: 40.0000 mg | ORAL_TABLET | Freq: Every day | ORAL | 0 refills | Status: DC

## 2023-09-04 MED ORDER — PENICILLIN G BENZATHINE 1200000 UNIT/2ML IM SUSY
2.4000 10*6.[IU] | PREFILLED_SYRINGE | Freq: Once | INTRAMUSCULAR | Status: AC
  Administered 2023-09-04: 2.4 10*6.[IU] via INTRAMUSCULAR
  Filled 2023-09-04: qty 4

## 2023-09-04 MED ORDER — VALACYCLOVIR HCL 1 G PO TABS: 1000.0000 mg | ORAL_TABLET | Freq: Two times a day (BID) | ORAL | 0 refills | Status: DC

## 2023-09-04 NOTE — ED Provider Triage Note (Signed)
 Emergency Medicine Provider Triage Evaluation Note  Brian Nolan , a 43 y.o. male  was evaluated in triage.  Pt complains of penile lesion x 1 month and arm lesions x 1 week. Both are healing  Review of Systems  Positive: Pruritic, painless, healing, numbness in fingers Negative: Discharge, dysuria, abdominal pain, chest pain, dyspnea, headaches, fever, cough  Physical Exam  BP (!) 140/81   Pulse 100   Temp 98.6 F (37 C) (Oral)   Resp 16   Ht 5' 9 (1.753 m)   Wt 107.5 kg   SpO2 99%   BMI 35.00 kg/m  Gen:   Awake, no distress   Resp:  Normal effort  MSK:   Moves extremities without difficulty  Other:    Medical Decision Making  Medically screening exam initiated at 1:32 PM.  Appropriate orders placed.  Adriane Curtistine Alisa Jacques was informed that the remainder of the evaluation will be completed by another provider, this initial triage assessment does not replace that evaluation, and the importance of remaining in the ED until their evaluation is complete.     Beola Terrall RAMAN, [place] 09/04/23 [phone]

## 2023-09-04 NOTE — ED Triage Notes (Signed)
 Pt states he has a sore on the side of his penis that has been there for a few weeks, denies discharge or pain. States it is itchy. States he noticed some bumps pop up on both his arms last week. States he is urinating in multiple streams.

## 2023-09-04 NOTE — ED Provider Notes (Signed)
 [place] EMERGENCY DEPARTMENT AT [facility] Provider Note   CSN: [id number] Arrival date & time: 09/04/23  1219     History  Chief Complaint  Patient presents with   Sore    Brian Nolan is a 43 y.o. male with a past medical history significant for schizophrenia, depression, substance abuse, hyperlipidemia, and alcohol abuse who presents to the ED due to multiple complaints.  Patient states he has had a sore on the head of his penis for 3 to 4 weeks.  No history of syphilis.  Patient sexually active with 2 partners.  Denies penile discharge.  Denies dysuria.  Denies fever and chills.  No testicular pain or edema.  Patient also admits to urinating with multiple streams.  No injury to penis.  Denies dysuria and hematuria.  No difficulties urinating.  Denies abdominal pain. No flank pain.   Also admits to a rash on his bilateral arms x1 week. Denies pruritus. No fever or chills.   History obtained from patient and past medical records. No interpreter used during encounter.       Home Medications Prior to Admission medications   Medication Sig Start Date End Date Taking? Authorizing Provider  predniSONE  (DELTASONE ) 20 MG tablet Take 2 tablets (40 mg total) by mouth daily for 5 days. 09/04/23 09/09/23 Yes Ardian Haberland, Aleck BROCKS, PA-C  valACYclovir  (VALTREX ) 1000 MG tablet Take 1 tablet (1,000 mg total) by mouth 2 (two) times daily for 7 days. 09/04/23 09/11/23 Yes Anayah Arvanitis, Aleck BROCKS, PA-C  hydrOXYzine  (ATARAX ) 25 MG tablet Take 1 tablet (25 mg total) by mouth 3 (three) times daily as needed for anxiety. Patient not taking: Reported on 07/24/2023 07/06/23   Harl Zane BRAVO, NP  pantoprazole  (PROTONIX ) 40 MG tablet Take 1 tablet (40 mg total) by mouth daily. Patient not taking: Reported on 07/24/2023 07/06/23   Harl Zane BRAVO, NP  sertraline  (ZOLOFT ) 50 MG tablet Take 1 tablet (50 mg total) by mouth daily. 08/03/23   Paliy, Alisa, MD  traZODone  (DESYREL ) 50 MG  tablet Take 1 tablet (50 mg total) by mouth at bedtime as needed for sleep. Patient not taking: Reported on 07/24/2023 07/06/23   Harl Zane BRAVO, NP      Allergies    Patient has no known allergies.    Review of Systems   Review of Systems  Constitutional:  Negative for fever.  Genitourinary:  Negative for dysuria, flank pain and hematuria.  Skin:  Positive for rash and wound.    Physical Exam Updated Vital Signs BP (!) 155/90   Pulse 99   Temp 98.6 F (37 C) (Oral)   Resp 18   Ht 5' 9 (1.753 m)   Wt 107.5 kg   SpO2 99%   BMI 35.00 kg/m  Physical Exam Vitals and nursing note reviewed.  Constitutional:      General: He is not in acute distress.    Appearance: He is not ill-appearing.  HENT:     Head: Normocephalic.  Eyes:     Pupils: Pupils are equal, round, and reactive to light.  Cardiovascular:     Rate and Rhythm: Normal rate and regular rhythm.     Pulses: Normal pulses.     Heart sounds: Normal heart sounds. No murmur heard.    No friction rub. No gallop.  Pulmonary:     Effort: Pulmonary effort is normal.     Breath sounds: Normal breath sounds.  Abdominal:     General: Abdomen is flat.  There is no distension.     Palpations: Abdomen is soft.     Tenderness: There is no abdominal tenderness. There is no guarding or rebound.  Genitourinary:    Comments: GU exam performed with chaperone in room.  Ulcerated sore on head of penis.  No penile discharge.  No testicular tenderness or edema. Musculoskeletal:        General: Normal range of motion.     Cervical back: Neck supple.  Skin:    General: Skin is warm and dry.     Comments: Papular rash to bilateral arms without drainage.  No petechiae or purpura.  Neurological:     General: No focal deficit present.     Mental Status: He is alert.  Psychiatric:        Mood and Affect: Mood normal.        Behavior: Behavior normal.     ED Results / Procedures / Treatments   Labs (all labs ordered are  listed, but only abnormal results are displayed) Labs Reviewed  COMPREHENSIVE METABOLIC PANEL - Abnormal; Notable for the following components:      Result Value   Glucose, Bld 117 (*)    Calcium 8.7 (*)    Albumin 3.3 (*)    All other components within normal limits  HSV CULTURE AND TYPING  CBC WITH DIFFERENTIAL/PLATELET  URINALYSIS, W/ REFLEX TO CULTURE (INFECTION SUSPECTED)  RPR  HIV ANTIBODY (ROUTINE TESTING W REFLEX)    EKG None  Radiology No results found.  Procedures Procedures    Medications Ordered in ED Medications  penicillin  g benzathine (BICILLIN  LA) 1200000 UNIT/2ML injection 2.4 Million Units (2.4 Million Units Intramuscular Given 09/04/23 1735)    ED Course/ Medical Decision Making/ A&P                                 Medical Decision Making Amount and/or Complexity of Data Reviewed Labs: ordered. Decision-making details documented in ED Course.  Risk Prescription drug management.   43 year old male presents to the ED due to multiple complaints of penile sore, rash to upper extremities, and urination changes. Patient is currently sexually active with 2 partners. No dysuria.  Denies fever and chills.  Upon arrival, stable vitals.  Patient in no acute distress.  GU exam performed with chaperone in room with ulcerated sore to head of penis.  No penile discharge.  No testicular tenderness or edema to suggest testicular torsion or epididymitis.  Papular rash to bilateral upper extremities without drainage.  No petechiae or purpura.  Routine labs ordered in triage.  RPR to rule out syphilis.  HIV ordered. Added HSV culture.  CBC unremarkable.  No leukocytosis.  Normal hemoglobin.  CMP reassuring.  Normal renal function.  No major electrolyte derangements.  UA negative for signs of infection.  Patient requesting to be treated for syphilis and herpes given test results are pending.  Patient given Bicillin  here in the ED.  Discharged with valacyclovir  and prednisone  for  rash.  [facility] number given to patient at discharge. Patient stable for discharge. Strict ED precautions discussed with patient. Patient states understanding and agrees to plan. Patient discharged home in no acute distress and stable vitals  Hx schizophrenia No PCP       Final Clinical Impression(s) / ED Diagnoses Final diagnoses:  Genital sore  Rash    Rx / DC Orders ED Discharge Orders  Ordered    valACYclovir  (VALTREX ) 1000 MG tablet  2 times daily        09/04/23 1743    predniSONE  (DELTASONE ) 20 MG tablet  Daily        09/04/23 1744              Arash Karstens C, PA-C 09/04/23 1746    Geraldene Hamilton, MD 09/04/23 2342

## 2023-09-04 NOTE — Discharge Instructions (Addendum)
 It was a pleasure taking care of you today.  As discussed, your syphilis, HIV, and herpes test are pending.  You requested to be treated for syphilis and herpes.  I am sending you home with medication.  Take as prescribed and finish all medication.  I have included the number of [facility].  Please call to schedule an appointment for further evaluation.  Return to the ER for any worsening symptoms.

## 2023-09-05 ENCOUNTER — Other Ambulatory Visit: Payer: Self-pay

## 2023-09-05 ENCOUNTER — Telehealth (HOSPITAL_COMMUNITY): Payer: Self-pay | Admitting: Emergency Medicine

## 2023-09-05 LAB — RPR
RPR Ser Ql: REACTIVE — AB
RPR Titer: 1:32 {titer}

## 2023-09-05 MED ORDER — PREDNISONE 20 MG PO TABS
40.0000 mg | ORAL_TABLET | Freq: Every day | ORAL | 0 refills | Status: DC
  Filled 2023-09-05: qty 10, 5d supply, fill #0

## 2023-09-05 MED ORDER — VALACYCLOVIR HCL 1 G PO TABS
1000.0000 mg | ORAL_TABLET | Freq: Two times a day (BID) | ORAL | 0 refills | Status: DC
  Filled 2023-09-05: qty 14, 7d supply, fill #0

## 2023-09-06 ENCOUNTER — Telehealth (HOSPITAL_COMMUNITY): Payer: Self-pay | Admitting: Emergency Medicine

## 2023-09-06 MED ORDER — VALACYCLOVIR HCL 1 G PO TABS
1000.0000 mg | ORAL_TABLET | Freq: Two times a day (BID) | ORAL | 0 refills | Status: AC
  Filled 2023-09-06: qty 14, 7d supply, fill #0

## 2023-09-06 MED ORDER — PREDNISONE 20 MG PO TABS
40.0000 mg | ORAL_TABLET | Freq: Every day | ORAL | 0 refills | Status: AC
  Filled 2023-09-06: qty 10, 5d supply, fill #0

## 2023-09-06 NOTE — Telephone Encounter (Cosign Needed)
 Patient seen here yesterday.  Called back requesting his medication to be sent to a new pharmacy.  This has been completed.

## 2023-09-07 ENCOUNTER — Other Ambulatory Visit: Payer: Self-pay

## 2023-09-08 LAB — HSV CULTURE AND TYPING

## 2023-09-08 LAB — T.PALLIDUM AB, TOTAL: T Pallidum Abs: REACTIVE — AB

## 2023-09-16 ENCOUNTER — Other Ambulatory Visit: Payer: Self-pay

## 2023-09-16 ENCOUNTER — Encounter (HOSPITAL_COMMUNITY): Payer: Self-pay | Admitting: Psychiatry

## 2023-09-16 ENCOUNTER — Telehealth (HOSPITAL_COMMUNITY): Payer: MEDICAID | Admitting: Psychiatry

## 2023-09-16 DIAGNOSIS — F411 Generalized anxiety disorder: Secondary | ICD-10-CM

## 2023-09-16 DIAGNOSIS — F1011 Alcohol abuse, in remission: Secondary | ICD-10-CM | POA: Diagnosis not present

## 2023-09-16 DIAGNOSIS — F1411 Cocaine abuse, in remission: Secondary | ICD-10-CM

## 2023-09-16 DIAGNOSIS — R12 Heartburn: Secondary | ICD-10-CM

## 2023-09-16 MED ORDER — HYDROXYZINE HCL 25 MG PO TABS
25.0000 mg | ORAL_TABLET | Freq: Three times a day (TID) | ORAL | 3 refills | Status: DC | PRN
  Filled 2023-09-16: qty 90, 30d supply, fill #0
  Filled 2023-10-12: qty 90, 30d supply, fill #1
  Filled 2023-11-10: qty 90, 30d supply, fill #2

## 2023-09-16 MED ORDER — PANTOPRAZOLE SODIUM 40 MG PO TBEC
40.0000 mg | DELAYED_RELEASE_TABLET | Freq: Every day | ORAL | 3 refills | Status: DC
  Filled 2023-09-16: qty 30, 30d supply, fill #0
  Filled 2023-10-12: qty 30, 30d supply, fill #1
  Filled 2023-11-09: qty 30, 30d supply, fill #2

## 2023-09-16 MED ORDER — SERTRALINE HCL 50 MG PO TABS: 50.0000 mg | ORAL_TABLET | Freq: Every day | ORAL | 0 refills | Status: DC

## 2023-09-16 MED ORDER — TRAZODONE HCL 50 MG PO TABS
50.0000 mg | ORAL_TABLET | Freq: Every evening | ORAL | 3 refills | Status: DC | PRN
  Filled 2023-09-16: qty 30, 30d supply, fill #0
  Filled 2023-10-12: qty 30, 30d supply, fill #1
  Filled 2023-11-10: qty 30, 30d supply, fill #2

## 2023-09-16 MED ORDER — SERTRALINE HCL 50 MG PO TABS
50.0000 mg | ORAL_TABLET | Freq: Every day | ORAL | 3 refills | Status: DC
  Filled 2023-09-16: qty 30, 30d supply, fill #0
  Filled 2023-10-12: qty 30, 30d supply, fill #1
  Filled 2023-11-09: qty 30, 30d supply, fill #2

## 2023-09-16 NOTE — Progress Notes (Signed)
 [facility] MD/PA/NP OP Progress Note Virtual Visit via Video Note  I connected with Brian Nolan on 09/16/23 at  3:30 PM EST by a video enabled telemedicine application and verified that I am speaking with the correct person using two identifiers.  Location: Patient: Home Provider: Clinic   I discussed the limitations of evaluation and management by telemedicine and the availability of in person appointments. The patient expressed understanding and agreed to proceed.  I provided 30 minutes of non-face-to-face time during this encounter.   09/16/2023 3:53 PM Brian Nolan  MRN:  [id number]  Chief Complaint: "I feel great" HPI: 43 year old male seen today for follow-up psychiatric evaluation.  He has a psychiatric history of Schizophrenia, insomnia,polysubstance use (cocaine, marijuana, alcohol, and tobacco), anxiety, depression, SI, and substance-induced mood disorder.  Patient was recently hospitalized at [facility] on 07/23/2023 through 08/03/2023 for increasing depression and suicidal ideation.  His medications were adjusted and he is currently managed on Zoloft  50 mg daily, trazodone  50 mg nightly as needed, and hydroxyzine  25 mg 3 times daily as needed.  Patient informed writer that after his discharge he went to [facility] for help managing his substance use.  He now notes that he has been sober for 2 months.  Today he is well-groomed, pleasant, cooperative, and engaged in conversation.  He informed Clinical research associate that he is doing great.  He notes that physically he is in a better place as well as mentally.  Today he denies symptoms of anxiety and depression and scored a 0 on his GAD-7, at his last visit with writer he scored a 7. Provider also conducted PHQ-9 patient scored a 0, at his last visit he scored a 3.  He endorses adequate sleep and increased appetite.  Patient notes that he has gained over 20 pounds since his last visit.  He notes that he feels that this weight is healthy weight.   He does note that he exercises daily as he recently had a gym membership.  Today he denies SI/HI/VAH, mania, or paranoia.  Patient informed Clinical research associate that he has been having increased heartburn.  He asked Clinical research associate to fill Protonix  as he will miss his appointment with his primary care doctor tomorrow as he has a job interview at the same time.  Provider was agreeable to filling Protonix .  No medication changes made today.  Patient agreeable to continue medication as prescribed.  No other concerns at this time. Visit Diagnosis:    ICD-10-CM   1. GAD (generalized anxiety disorder)  F41.1 hydrOXYzine  (ATARAX ) 25 MG tablet    traZODone  (DESYREL ) 50 MG tablet    sertraline  (ZOLOFT ) 50 MG tablet    DISCONTINUED: sertraline  (ZOLOFT ) 50 MG tablet    2. Heart burn  R12 pantoprazole  (PROTONIX ) 40 MG tablet    3. Cocaine use disorder, mild, in early remission (HCC)  F14.11     4. Alcohol use disorder, mild, in early remission, abuse  F10.11       Past Psychiatric History: Schizophrenia, insomnia, polysubstance use (cocaine, marijuana, alcohol, and tobacco), anxiety, depression, SI, and substance-induced mood disorder  Past Medical History:  Past Medical History:  Diagnosis Date   Depression    History of hiatal hernia    Schizophrenia (HCC)    Substance abuse (HCC)     Past Surgical History:  Procedure Laterality Date   NO PAST SURGERIES      Family Psychiatric History:  Maternal uncle schizophrenia and bipolar, maternal cousin,   Family History:  Family  History  Problem Relation Age of Onset   Schizophrenia Maternal Uncle    Diabetes Mellitus II Father     Social History:  Social History   Socioeconomic History   Marital status: Single    Spouse name: Not on file   Number of children: Not on file   Years of education: Not on file   Highest education level: Not on file  Occupational History   Not on file  Tobacco Use   Smoking status: Every Day    Current packs/day: 1.00     Average packs/day: 1 pack/day for 14.6 years (14.6 ttl pk-yrs)    Types: Cigarettes    Start date: 01/31/2008    Last attempt to quit: 01/30/2018    Passive exposure: Never   Smokeless tobacco: Never  Vaping Use   Vaping status: Former  Substance and Sexual Activity   Alcohol use: Yes    Alcohol/week: 6.0 - 24.0 standard drinks of alcohol    Types: 6 - 24 Cans of beer per week    Comment: 12 ppd   Drug use: Yes    Types: "Crack" cocaine, Marijuana, Cocaine   Sexual activity: Yes    Birth control/protection: Condom  Other Topics Concern   Not on file  Social History Narrative   Not on file   Social Drivers of Health   Financial Resource Strain: Not on file  Food Insecurity: No Food Insecurity (07/23/2023)   Hunger Vital Sign    Worried About Running Out of Food in the Last Year: Never true    Ran Out of Food in the Last Year: Never true  Transportation Needs: No Transportation Needs (07/23/2023)   PRAPARE - Administrator, Civil Service (Medical): No    Lack of Transportation (Non-Medical): No  Physical Activity: Not on file  Stress: Not on file  Social Connections: Not on file    Allergies: No Known Allergies  Metabolic Disorder Labs: Lab Results  Component Value Date   HGBA1C 5.7 (H) 05/31/2023   MPG 116.89 05/31/2023   MPG 99.67 11/16/2021   No results found for: "PROLACTIN" Lab Results  Component Value Date   CHOL 185 06/02/2023   TRIG 443 (H) 06/02/2023   HDL 41 06/02/2023   CHOLHDL 4.5 06/02/2023   VLDL UNABLE TO CALCULATE IF TRIGLYCERIDE OVER 400 mg/dL 16/06/9603   LDLCALC UNABLE TO CALCULATE IF TRIGLYCERIDE OVER 400 mg/dL 54/05/8118   LDLCALC 89 11/16/2021   Lab Results  Component Value Date   TSH 0.982 05/31/2023   TSH 1.225 11/16/2021    Therapeutic Level Labs: No results found for: "LITHIUM" No results found for: "VALPROATE" No results found for: "CBMZ"  Current Medications: Current Outpatient Medications  Medication Sig Dispense  Refill   hydrOXYzine  (ATARAX ) 25 MG tablet Take 1 tablet (25 mg total) by mouth 3 (three) times daily as needed for anxiety. 90 tablet 3   pantoprazole  (PROTONIX ) 40 MG tablet Take 1 tablet (40 mg total) by mouth daily. 30 tablet 3   sertraline  (ZOLOFT ) 50 MG tablet Take 1 tablet (50 mg total) by mouth daily. 30 tablet 3   traZODone  (DESYREL ) 50 MG tablet Take 1 tablet (50 mg total) by mouth at bedtime as needed for sleep. 30 tablet 3   No current facility-administered medications for this visit.     Musculoskeletal: Strength & Muscle Tone: within normal limits and telehealth visit Gait & Station: normal, telehealth visit Patient leans: N/A  Psychiatric Specialty Exam: Review of Systems  There were no vitals taken for this visit.There is no height or weight on file to calculate BMI.  General Appearance: Well Groomed  Eye Contact:  Good  Speech:  Clear and Coherent and Normal Rate  Volume:  Normal  Mood:  Euthymic  Affect:  Appropriate and Congruent  Thought Process:  Coherent, Goal Directed, and Linear  Orientation:  Full (Time, Place, and Person)  Thought Content: WDL and Logical   Suicidal Thoughts:  No  Homicidal Thoughts:  No  Memory:  Immediate;   Good Recent;   Good Remote;   Good  Judgement:  Good  Insight:  Good  Psychomotor Activity:  Normal  Concentration:  Concentration: Good and Attention Span: Good  Recall:  Good  Fund of Knowledge: Good  Language: Good  Akathisia:  No  Handed:  Right  AIMS (if indicated): not done  Assets:  Communication Skills Desire for Improvement Financial Resources/Insurance Housing Leisure Time Physical Health Social Support Talents/Skills  ADL's:  Intact  Cognition: WNL  Sleep:  Good   Screenings: AIMS    Flowsheet Row [facility] (Dis[facility] (Discharged) from 04/24/2016 in [facility] Ad[facility] ([facility]) [facility]  AIMS Total Score 0 0 0 0 0      AUDIT    Flowsheet Row [facility]) [facility] Visit from 07/06/2023 in [facility] Admission (Discharged) [facility] (Dis[facility]) [facility] (Dis[facility]  Alcohol Use Disorder Identification Test Final Score (AUDIT) 18 17 14 26  (P)  36      GAD-7    Flowsheet Row Video Visit from 09/16/2023 in [facility] Video Visit from 07/06/2023 in [facility] Office Visit from 12/25/2021 in [facility]  Total GAD-7 Score 0 7 3      PHQ2-9    Flowsheet Row Video Visit from 09/16/2023 in [facility] Video Visit from 07/06/2023 in [facility] ED from 05/31/2023 in [facility] Office Visit from 12/25/2021 in [facility]  PHQ-2 Total Score 0 1 6 0  PHQ-9 Total Score 0 3 16 --      Flowsheet Row Video Visit from 09/16/2023 in [facility] ED from 09/04/2023 in [facility] Emergency Department at [facility] Admission (Discharged[facility]  C-SSRS RISK CATEGORY Error: Q3, 4, or 5 should not be populated when Q2 is No No Risk Moderate Risk        Assessment and Plan: Patient reports that he is doing well on his current medication regimen.  No medication changes made.  Patient agreeable  to continuing medication as prescribed.  Provider agreeable to fill Protonix  as patient notes that he will miss his appointment tomorrow with his primary care doctor to have a job interview.  1. GAD (generalized anxiety disorder) (Primary)  Continue- hydrOXYzine  (ATARAX ) 25 MG tablet; Take 1 tablet (25 mg total) by mouth 3 (three) times daily as needed for anxiety.  Dispense: 90  tablet; Refill: 3 Continue- sertraline  (ZOLOFT ) 50 MG tablet; Take 1 tablet (50 mg total) by mouth daily.  Dispense: 30 tablet; Refill: 3 Continue- traZODone  (DESYREL ) 50 MG tablet; Take 1 tablet (50 mg total) by mouth at bedtime as needed for sleep.  Dispense: 30 tablet; Refill: 3  2. Heart burn  Continue- pantoprazole  (PROTONIX ) 40 MG tablet; Take 1 tablet (40 mg total) by mouth daily.  Dispense: 30 tablet; Refill: 3  3. Cocaine use disorder, mild, in early remission (HCC)  4. Alcohol use disorder, mild, in early remission, abuse     Collaboration of Care: Collaboration of Care: Other provider involved in patient's care AEB PCP  Patient/Guardian was advised Release of Information must be obtained prior to any record release in order to collaborate their care with an outside provider. Patient/Guardian was advised if they have not already done so to contact the registration department to sign all necessary forms in order for us  to release information regarding their care.   Consent: Patient/Guardian gives verbal consent for treatment and assignment of benefits for services provided during this visit. Patient/Guardian expressed understanding and agreed to proceed.    Arlyne Bering, NP 09/16/2023, 3:53 PM

## 2023-09-17 ENCOUNTER — Other Ambulatory Visit: Payer: Self-pay

## 2023-09-22 ENCOUNTER — Other Ambulatory Visit: Payer: Self-pay

## 2023-09-22 MED ORDER — AMOXICILLIN 500 MG PO CAPS
500.0000 mg | ORAL_CAPSULE | Freq: Four times a day (QID) | ORAL | 0 refills | Status: AC
  Filled 2023-09-22: qty 28, 7d supply, fill #0

## 2023-09-28 ENCOUNTER — Other Ambulatory Visit: Payer: Self-pay

## 2023-09-29 ENCOUNTER — Other Ambulatory Visit: Payer: Self-pay

## 2023-10-12 ENCOUNTER — Other Ambulatory Visit: Payer: Self-pay

## 2023-10-15 ENCOUNTER — Other Ambulatory Visit: Payer: Self-pay

## 2023-10-17 ENCOUNTER — Ambulatory Visit (HOSPITAL_COMMUNITY): Payer: Self-pay

## 2023-11-10 ENCOUNTER — Other Ambulatory Visit: Payer: Self-pay

## 2023-11-13 ENCOUNTER — Other Ambulatory Visit: Payer: Self-pay

## 2023-11-17 ENCOUNTER — Other Ambulatory Visit: Payer: Self-pay

## 2023-11-17 MED ORDER — MELOXICAM 15 MG PO TABS
15.0000 mg | ORAL_TABLET | Freq: Every day | ORAL | 0 refills | Status: AC
  Filled 2023-11-17: qty 14, 14d supply, fill #0

## 2023-11-17 MED ORDER — CYCLOBENZAPRINE HCL 10 MG PO TABS
10.0000 mg | ORAL_TABLET | Freq: Three times a day (TID) | ORAL | 0 refills | Status: AC | PRN
  Filled 2023-11-17: qty 30, 10d supply, fill #0

## 2023-11-25 ENCOUNTER — Telehealth (HOSPITAL_COMMUNITY): Payer: MEDICAID | Admitting: Psychiatry

## 2023-11-25 ENCOUNTER — Encounter (HOSPITAL_COMMUNITY): Payer: Self-pay | Admitting: Psychiatry

## 2023-11-25 ENCOUNTER — Other Ambulatory Visit: Payer: Self-pay

## 2023-11-25 DIAGNOSIS — R12 Heartburn: Secondary | ICD-10-CM

## 2023-11-25 DIAGNOSIS — F411 Generalized anxiety disorder: Secondary | ICD-10-CM | POA: Diagnosis not present

## 2023-11-25 DIAGNOSIS — Z0000 Encounter for general adult medical examination without abnormal findings: Secondary | ICD-10-CM

## 2023-11-25 MED ORDER — TRAZODONE HCL 50 MG PO TABS
50.0000 mg | ORAL_TABLET | Freq: Every evening | ORAL | 3 refills | Status: AC | PRN
  Filled 2023-11-25 – 2023-12-08 (×2): qty 30, 30d supply, fill #0

## 2023-11-25 MED ORDER — HYDROXYZINE HCL 25 MG PO TABS
25.0000 mg | ORAL_TABLET | Freq: Three times a day (TID) | ORAL | 3 refills | Status: AC | PRN
  Filled 2023-11-25: qty 90, 30d supply, fill #0

## 2023-11-25 MED ORDER — SERTRALINE HCL 25 MG PO TABS
25.0000 mg | ORAL_TABLET | Freq: Every day | ORAL | 3 refills | Status: AC
  Filled 2023-11-25 – 2023-12-08 (×2): qty 30, 30d supply, fill #0
  Filled 2023-12-31 – 2024-02-05 (×4): qty 30, 30d supply, fill #1

## 2023-11-25 MED ORDER — PANTOPRAZOLE SODIUM 40 MG PO TBEC
40.0000 mg | DELAYED_RELEASE_TABLET | Freq: Every day | ORAL | 3 refills | Status: AC
  Filled 2023-11-25 – 2023-12-08 (×2): qty 30, 30d supply, fill #0
  Filled 2024-01-06 – 2024-01-29 (×3): qty 30, 30d supply, fill #1

## 2023-11-25 NOTE — Progress Notes (Signed)
 BH MD/PA/NP OP Progress Note Virtual Visit via Video Note  I connected with [name] on 11/25/23 at  2:00 PM EDT by a video enabled telemedicine application and verified that I am speaking with the correct person using two identifiers.  Location: Patient: Home Provider: Clinic   I discussed the limitations of evaluation and management by telemedicine and the availability of in person appointments. The patient expressed understanding and agreed to proceed.  I provided 30 minutes of non-face-to-face time during this encounter.   11/25/2023 1:22 PM Brian Nolan  MRN:  [id number]  Chief Complaint: "I am in a much better place"  HPI: 43 year old male seen today for follow-up psychiatric evaluation.  He has a psychiatric history of Schizophrenia, insomnia,polysubstance use (cocaine, marijuana, alcohol, and tobacco in remission since November 2024), anxiety, depression, SI, and substance-induced mood disorder. He is currently managed on Zoloft 50 mg daily, trazodone 50 mg nightly as needed, and hydroxyzine 25 mg 3 times daily as needed.  Patient informed writer that his medications are effective in managing his psychiatric condition.  Today he is well-groomed, pleasant, cooperative, and engaged in conversation.  He informed Clinical research associate that he is in a much better place.  He notes that he continues to be sober from illegal substances since November 2024.  Patient notes that life is better now than it ever was.  He notes that he now sees the benefit of mental health treatment and substance use treatment.  Patient informed Clinical research associate that he continues to go to support groups and find success in this.    Since his last visit he notes his anxiety and depression has been well-managed.  Today provider conducted a GAD-7 and patient scored a 1, at his last visit he scored a 0.  Provider also conducted PHQ-9 and patient scored a 1, at his last visit he scored a 0.  He endorses adequate  sleep and appetite.  Today he denies SI/HI/VAH, mania, or paranoia.  Patient informed Clinical research associate that he continues to have increased heartburn.  He asked Clinical research associate to fill Protonix.  Patient reports that he does not have a appointment until May.  He also requested to be referred to [facility] for primary care as community health and wellness were no longer taking new patients.  Provider was agreeable to filling Protonix and referring patient to [place] [facility].    Patient informed Clinical research associate that he has been having blurred vision.  He asked if his medication can contribute.  Provider informed patient that antidepressant can cause changes in vision.  Provider asked patient if he had that his eye examination time recently.  He informed Clinical research associate that he has had his eye exam then notes that his vision looks good.  He was prescribed glasses to utilize at night but not during the day.  Patient reports that have Zoloft reduces his anxiety, depression, and mood are stable.  Provider was agreeable.  He will continue all other medications as prescribed.  No other concerns at this time. Visit Diagnosis:    ICD-10-CM   1. Well adult exam  Z00.00 Ambulatory referral to Internal Medicine    2. GAD (generalized anxiety disorder)  F41.1 hydrOXYzine (ATARAX) 25 MG tablet    traZODone (DESYREL) 50 MG tablet    sertraline (ZOLOFT) 25 MG tablet    3. Heart burn  R12 pantoprazole (PROTONIX) 40 MG tablet       Past Psychiatric History: Schizophrenia, insomnia, polysubstance use (cocaine, marijuana, alcohol,  and tobacco), anxiety, depression, SI, and substance-induced mood disorder  Past Medical History:  Past Medical History:  Diagnosis Date   Depression    History of hiatal hernia    Schizophrenia (HCC)    Substance abuse (HCC)     Past Surgical History:  Procedure Laterality Date   NO PAST SURGERIES      Family Psychiatric History:  Maternal uncle schizophrenia and bipolar, maternal cousin,    Family History:  Family History  Problem Relation Age of Onset   Schizophrenia Maternal Uncle    Diabetes Mellitus II Father     Social History:  Social History   Socioeconomic History   Marital status: Single    Spouse name: Not on file   Number of children: Not on file   Years of education: Not on file   Highest education level: Not on file  Occupational History   Not on file  Tobacco Use   Smoking status: Every Day    Current packs/day: 1.00    Average packs/day: 1 pack/day for 14.8 years (14.8 ttl pk-yrs)    Types: Cigarettes    Start date: 01/31/2008    Last attempt to quit: 01/30/2018    Passive exposure: Never   Smokeless tobacco: Never  Vaping Use   Vaping status: Former  Substance and Sexual Activity   Alcohol use: Yes    Alcohol/week: 6.0 - 24.0 standard drinks of alcohol    Types: 6 - 24 Cans of beer per week    Comment: 12 ppd   Drug use: Yes    Types: "Crack" cocaine, Marijuana, Cocaine   Sexual activity: Yes    Birth control/protection: Condom  Other Topics Concern   Not on file  Social History Narrative   Not on file   Social Drivers of Health   Financial Resource Strain: Not on file  Food Insecurity: No Food Insecurity (07/23/2023)   Hunger Vital Sign    Worried About Running Out of Food in the Last Year: Never true    Ran Out of Food in the Last Year: Never true  Transportation Needs: No Transportation Needs (07/23/2023)   PRAPARE - Administrator, Civil Service (Medical): No    Lack of Transportation (Non-Medical): No  Physical Activity: Not on file  Stress: Not on file  Social Connections: Not on file    Allergies: No Known Allergies  Metabolic Disorder Labs: Lab Results  Component Value Date   HGBA1C 5.7 (H) 05/31/2023   MPG 116.89 05/31/2023   MPG 99.67 11/16/2021   No results found for: "PROLACTIN" Lab Results  Component Value Date   CHOL 185 06/02/2023   TRIG 443 (H) 06/02/2023   HDL 41 06/02/2023   CHOLHDL  4.5 06/02/2023   VLDL UNABLE TO CALCULATE IF TRIGLYCERIDE OVER 400 mg/dL 40/98/1191   LDLCALC UNABLE TO CALCULATE IF TRIGLYCERIDE OVER 400 mg/dL 47/82/9562   LDLCALC 89 11/16/2021   Lab Results  Component Value Date   TSH 0.982 05/31/2023   TSH 1.225 11/16/2021    Therapeutic Level Labs: No results found for: "LITHIUM" No results found for: "VALPROATE" No results found for: "CBMZ"  Current Medications: Current Outpatient Medications  Medication Sig Dispense Refill   amoxicillin (AMOXIL) 500 MG capsule Take 1 capsule (500 mg total) by mouth every 6 (six) hours UNTIL COMPLETE. 28 capsule 0   cyclobenzaprine (FLEXERIL) 10 MG tablet Take 1 tablet (10 mg total) by mouth 3 (three) times daily AS NEEDED FOR MUSCLE SPASMS FOR UP  TO 10 DAYS. 30 tablet 0   hydrOXYzine (ATARAX) 25 MG tablet Take 1 tablet (25 mg total) by mouth 3 (three) times daily as needed for anxiety. 90 tablet 3   meloxicam (MOBIC) 15 MG tablet Take 1 tablet (15 mg total) by mouth daily FOR 14 DAYS. 14 tablet 0   pantoprazole (PROTONIX) 40 MG tablet Take 1 tablet (40 mg total) by mouth daily. 30 tablet 3   sertraline (ZOLOFT) 25 MG tablet Take 1 tablet (25 mg total) by mouth daily. 30 tablet 3   traZODone (DESYREL) 50 MG tablet Take 1 tablet (50 mg total) by mouth at bedtime as needed for sleep. 30 tablet 3   No current facility-administered medications for this visit.     Musculoskeletal: Strength & Muscle Tone: within normal limits and telehealth visit Gait & Station: normal, telehealth visit Patient leans: N/A  Psychiatric Specialty Exam: Review of Systems  There were no vitals taken for this visit.There is no height or weight on file to calculate BMI.  General Appearance: Well Groomed  Eye Contact:  Good  Speech:  Clear and Coherent and Normal Rate  Volume:  Normal  Mood:  Euthymic  Affect:  Appropriate and Congruent  Thought Process:  Coherent, Goal Directed, and Linear  Orientation:  Full (Time, Place,  and Person)  Thought Content: WDL and Logical   Suicidal Thoughts:  No  Homicidal Thoughts:  No  Memory:  Immediate;   Good Recent;   Good Remote;   Good  Judgement:  Good  Insight:  Good  Psychomotor Activity:  Normal  Concentration:  Concentration: Good and Attention Span: Good  Recall:  Good  Fund of Knowledge: Good  Language: Good  Akathisia:  No  Handed:  Right  AIMS (if indicated): not done  Assets:  Communication Skills Desire for Improvement Financial Resources/Insurance Housing Leisure Time Physical Health Social Support Talents/Skills  ADL's:  Intact  Cognition: WNL  Sleep:  Good   Screenings: AIMS    Flowsheet Row [facility] ([facility] ([facility] Ad[facility] (Discharged) from 04/24/2016 in [facility] Ad[facility] ([facility]) [facility]  AIMS Total Score 0 0 0 0 0      AUDIT    Flowsheet Row [facility]) [facility] Video Visit from 07/06/2023 in [facility] Admission (Discharged) from [facility] Ad[facility] (Discharged) [facility] (Discharged[facility]  Alcohol Use Disorder Identification Test Final Score (AUDIT) 18 17 14 26  (P)  36      GAD-7    Flowsheet Row Video Visit from 11/25/2023 in [facility] Video Visit from 09/16/2023 in [facility] Video Visit from 07/06/2023 in [facility] Office Visit from 12/25/2021 in [facility]  Total GAD-7 Score 1 0 7 3      PHQ2-9    Flowsheet Row Video Visit from 11/25/2023 in [facility] Video Visit from 09/16/2023 in [facility] Video Visit from 07/06/2023 in [facility] ED from 05/31/2023 in [facility] Office Visit from  12/25/2021 in [facility]  PHQ-2 Total Score 0 0 1 6 0  PHQ-9 Total Score 1 0 3 16 --      Flowsheet Row Video Visit from 09/16/2023 in [facility] ED from 09/04/2023 in [facility] at [facility] Admission (Discharged) [facility]  C-SSRS RISK CATEGORY Error: Q3, 4, or 5 should not be populated when Q2 is No No Risk Moderate Risk        Assessment and Plan: Patient informed writer that he has been having blurred vision.  He asked if his medication can contribute.  Provider informed patient that antidepressant can cause changes in vision.  Provider asked patient if he had that his eye examination time recently.  He informed Clinical research associate that he has had his eye exam then notes that his vision looks good.  He was prescribed glasses to utilize at night but not during the day.Patient reports that have Zoloft reduces his anxiety, depression, and mood are stable.  Provider was agreeable.  Patient also notes that he is in need of Protonix and a new primary care provider.  Patient referred [facility].  Provider was agreeable to filling Protonix.  1. Well adult exam (Primary)  - Ambulatory referral to Internal Medicine  2. GAD (generalized anxiety disorder)  Continue- hydrOXYzine (ATARAX) 25 MG tablet; Take 1 tablet (25 mg total) by mouth 3 (three) times daily as needed for anxiety.  Dispense: 90 tablet; Refill: 3 Continue- traZODone (DESYREL) 50 MG tablet; Take 1 tablet (50 mg total)  by mouth at bedtime as needed for sleep.  Dispense: 30 tablet; Refill: 3 Reduced- sertraline (ZOLOFT) 25 MG tablet; Take 1 tablet (25 mg total) by mouth daily.  Dispense: 30 tablet; Refill: 3  3. Heart burn  Continue- pantoprazole (PROTONIX) 40 MG tablet; Take 1 tablet (40 mg total) by mouth daily.  Dispense: 30 tablet; Refill: 3     Collaboration of Care: Collaboration of Care: Other provider involved in patient's care AEB PCP  Patient/Guardian was advised Release of Information must be obtained prior to any record release in order to collaborate their care with an outside provider. Patient/Guardian was advised if they have not already done so to contact the registration department to sign all necessary forms in order for [place] to release information regarding their care.   Consent: Patient/Guardian gives verbal consent for treatment and assignment of benefits for services provided during this visit. Patient/Guardian expressed understanding and agreed to proceed.    Shanna Cisco, NP 11/25/2023, 1:22 PM

## 2023-12-08 ENCOUNTER — Other Ambulatory Visit: Payer: Self-pay

## 2024-01-11 ENCOUNTER — Other Ambulatory Visit: Payer: Self-pay

## 2024-01-15 ENCOUNTER — Other Ambulatory Visit: Payer: Self-pay

## 2024-01-21 ENCOUNTER — Other Ambulatory Visit: Payer: Self-pay

## 2024-01-21 ENCOUNTER — Ambulatory Visit: Payer: MEDICAID | Admitting: Family Medicine

## 2024-01-28 ENCOUNTER — Other Ambulatory Visit: Payer: Self-pay

## 2024-02-03 ENCOUNTER — Encounter (HOSPITAL_COMMUNITY): Payer: Self-pay

## 2024-02-03 ENCOUNTER — Telehealth (HOSPITAL_COMMUNITY): Payer: MEDICAID | Admitting: Psychiatry

## 2024-02-04 ENCOUNTER — Other Ambulatory Visit: Payer: Self-pay

## 2024-02-10 ENCOUNTER — Other Ambulatory Visit: Payer: Self-pay

## 2024-02-16 ENCOUNTER — Other Ambulatory Visit: Payer: Self-pay

## 2024-05-24 ENCOUNTER — Other Ambulatory Visit (HOSPITAL_COMMUNITY): Payer: Self-pay

## 2024-05-28 ENCOUNTER — Other Ambulatory Visit (HOSPITAL_COMMUNITY): Payer: Self-pay
